# Patient Record
Sex: Female | Born: 1955
Health system: Southern US, Community
[De-identification: ages and names within clinical notes are randomized; demographics above are authoritative.]

## PROBLEM LIST (undated history)

## (undated) DIAGNOSIS — T7840XA Allergy, unspecified, initial encounter: Secondary | ICD-10-CM

## (undated) DIAGNOSIS — M1990 Unspecified osteoarthritis, unspecified site: Secondary | ICD-10-CM

## (undated) DIAGNOSIS — L570 Actinic keratosis: Secondary | ICD-10-CM

## (undated) DIAGNOSIS — Z8719 Personal history of other diseases of the digestive system: Secondary | ICD-10-CM

## (undated) DIAGNOSIS — B029 Zoster without complications: Secondary | ICD-10-CM

## (undated) DIAGNOSIS — T8859XA Other complications of anesthesia, initial encounter: Secondary | ICD-10-CM

## (undated) DIAGNOSIS — K219 Gastro-esophageal reflux disease without esophagitis: Secondary | ICD-10-CM

## (undated) DIAGNOSIS — E785 Hyperlipidemia, unspecified: Secondary | ICD-10-CM

## (undated) DIAGNOSIS — K649 Unspecified hemorrhoids: Secondary | ICD-10-CM

## (undated) HISTORY — DX: Allergy, unspecified, initial encounter: T78.40XA

## (undated) HISTORY — PX: FOOT SURGERY: SHX648

## (undated) HISTORY — PX: FOOT FRACTURE SURGERY: SHX645

## (undated) HISTORY — DX: Actinic keratosis: L57.0

## (undated) HISTORY — DX: Hyperlipidemia, unspecified: E78.5

---

## 2005-05-04 ENCOUNTER — Ambulatory Visit: Payer: Self-pay | Admitting: Family Medicine

## 2006-06-23 ENCOUNTER — Ambulatory Visit: Payer: Self-pay | Admitting: Family Medicine

## 2006-09-28 HISTORY — PX: PLANTAR FASCIECTOMY: SUR600

## 2007-10-10 ENCOUNTER — Ambulatory Visit: Payer: Self-pay | Admitting: Family Medicine

## 2008-01-10 ENCOUNTER — Ambulatory Visit: Payer: Self-pay | Admitting: Gastroenterology

## 2008-07-31 ENCOUNTER — Ambulatory Visit: Payer: Self-pay | Admitting: Family Medicine

## 2008-12-11 ENCOUNTER — Ambulatory Visit: Payer: Self-pay | Admitting: Family Medicine

## 2009-12-24 ENCOUNTER — Ambulatory Visit: Payer: Self-pay | Admitting: Family Medicine

## 2010-07-14 ENCOUNTER — Ambulatory Visit: Payer: Self-pay | Admitting: Family Medicine

## 2010-07-16 ENCOUNTER — Ambulatory Visit: Payer: Self-pay | Admitting: Family Medicine

## 2011-04-16 ENCOUNTER — Ambulatory Visit: Payer: Self-pay | Admitting: Family Medicine

## 2012-07-13 ENCOUNTER — Ambulatory Visit: Payer: Self-pay | Admitting: Family Medicine

## 2013-10-26 ENCOUNTER — Ambulatory Visit: Payer: Self-pay | Admitting: Family Medicine

## 2013-11-03 ENCOUNTER — Ambulatory Visit: Payer: Self-pay | Admitting: Family Medicine

## 2014-09-17 ENCOUNTER — Ambulatory Visit: Payer: Self-pay | Admitting: Unknown Physician Specialty

## 2014-11-08 ENCOUNTER — Ambulatory Visit: Payer: Self-pay | Admitting: Family Medicine

## 2015-03-06 ENCOUNTER — Ambulatory Visit: Payer: Self-pay | Admitting: Family Medicine

## 2015-03-08 ENCOUNTER — Other Ambulatory Visit: Payer: Self-pay

## 2015-03-08 DIAGNOSIS — B029 Zoster without complications: Secondary | ICD-10-CM | POA: Insufficient documentation

## 2015-03-08 DIAGNOSIS — E785 Hyperlipidemia, unspecified: Secondary | ICD-10-CM | POA: Insufficient documentation

## 2015-03-08 DIAGNOSIS — Z8639 Personal history of other endocrine, nutritional and metabolic disease: Secondary | ICD-10-CM | POA: Insufficient documentation

## 2015-03-08 DIAGNOSIS — M5412 Radiculopathy, cervical region: Secondary | ICD-10-CM | POA: Insufficient documentation

## 2015-03-08 DIAGNOSIS — M779 Enthesopathy, unspecified: Secondary | ICD-10-CM | POA: Insufficient documentation

## 2015-03-08 DIAGNOSIS — Z0000 Encounter for general adult medical examination without abnormal findings: Secondary | ICD-10-CM | POA: Insufficient documentation

## 2015-03-11 ENCOUNTER — Encounter: Payer: Self-pay | Admitting: Family Medicine

## 2015-03-11 ENCOUNTER — Ambulatory Visit (INDEPENDENT_AMBULATORY_CARE_PROVIDER_SITE_OTHER): Payer: BC Managed Care – PPO | Admitting: Family Medicine

## 2015-03-11 VITALS — BP 110/60 | HR 64 | Ht 59.0 in | Wt 142.0 lb

## 2015-03-11 DIAGNOSIS — M545 Low back pain, unspecified: Secondary | ICD-10-CM

## 2015-03-11 DIAGNOSIS — S46012D Strain of muscle(s) and tendon(s) of the rotator cuff of left shoulder, subsequent encounter: Secondary | ICD-10-CM

## 2015-03-11 MED ORDER — ETODOLAC 500 MG PO TABS: 500.0000 mg | ORAL_TABLET | Freq: Two times a day (BID) | ORAL | Status: DC

## 2015-03-11 NOTE — Progress Notes (Signed)
Name: Jennifer Kirby   MRN: [id number]    DOB: 1956/04/29   Date:03/11/2015       Progress Note  Subjective  Chief Complaint  Chief Complaint  Patient presents with  . Back Pain    Lower  . Leg Pain    Left  . Otalgia    Left    Back Pain This is a recurrent problem. The current episode started more than 1 month ago. The problem occurs daily. The problem is unchanged. The pain is present in the lumbar spine and sacro-iliac. The quality of the pain is described as aching. The pain radiates to the right thigh and left knee. The pain is at a severity of 5/10. The pain is moderate. The pain is worse during the day. The symptoms are aggravated by bending, sitting and twisting. Associated symptoms include leg pain. Pertinent negatives include no bladder incontinence, bowel incontinence, fever, numbness, paresis, paresthesias or weakness.  Leg Pain  Pertinent negatives include no inability to bear weight or numbness.  Otalgia   Shoulder Pain  This is a recurrent problem. The problem occurs daily. The problem has been unchanged. The pain is moderate. Pertinent negatives include no fever, inability to bear weight, itching, joint locking, joint swelling, limited range of motion or numbness. The symptoms are aggravated by activity. She has tried acetaminophen and NSAIDS for the symptoms. The treatment provided no relief.    No problem-specific assessment & plan notes found for this encounter.   Past Medical History  Diagnosis Date  . Hyperlipidemia     Past Surgical History  Procedure Laterality Date  . Foot surgery Left     plantar fasciatis and broken foot    History reviewed. No pertinent family history.  History   Social History  . Marital Status: Married    Spouse Name: N/A  . Number of Children: N/A  . Years of Education: N/A   Occupational History  . Not on file.   Social History Main Topics  . Smoking status: Never Smoker   . Smokeless tobacco: Not on file   . Alcohol Use: 0.0 oz/week    0 Standard drinks or equivalent per week  . Drug Use: No  . Sexual Activity: No   Other Topics Concern  . Not on file   Social History Narrative  . No narrative on file    No Known Allergies   Review of Systems  Constitutional: Negative for fever.  HENT: Positive for ear pain.   Gastrointestinal: Negative for bowel incontinence.  Genitourinary: Negative for bladder incontinence.  Musculoskeletal: Positive for back pain.  Skin: Negative for itching.  Neurological: Negative for weakness, numbness and paresthesias.     Objective  Filed Vitals:   03/11/15 0904  BP: 110/60  Pulse: 64  Height: 4\' 11"  (1.499 m)  Weight: 142 lb (64.411 kg)    Physical Exam    No results found for this or any previous visit (from the past 2160 hour(s)).   Assessment & Plan  Problem List Items Addressed This Visit    None    Visit Diagnoses    Bilateral low back pain without sciatica    -  Primary    Relevant Medications    etodolac (LODINE) 500 MG tablet    Other Relevant Orders    DG Lumbar Spine Complete    Ambulatory referral to Physical Therapy    Rotator cuff (capsule) sprain and strain, left, subsequent encounter  Relevant Medications    etodolac (LODINE) 500 MG tablet         Dr. Otilio Miu [facility]  03/11/2015

## 2015-05-22 ENCOUNTER — Other Ambulatory Visit: Payer: Self-pay

## 2015-05-22 DIAGNOSIS — E785 Hyperlipidemia, unspecified: Secondary | ICD-10-CM

## 2015-05-22 MED ORDER — SIMVASTATIN 10 MG PO TABS: 10.0000 mg | ORAL_TABLET | Freq: Every day | ORAL | Status: DC

## 2015-08-13 ENCOUNTER — Other Ambulatory Visit: Payer: Self-pay

## 2015-08-14 ENCOUNTER — Encounter: Payer: Self-pay | Admitting: Family Medicine

## 2015-08-14 ENCOUNTER — Ambulatory Visit (INDEPENDENT_AMBULATORY_CARE_PROVIDER_SITE_OTHER): Payer: BC Managed Care – PPO | Admitting: Family Medicine

## 2015-08-14 VITALS — BP 120/80 | HR 70 | Ht 59.0 in | Wt 142.0 lb

## 2015-08-14 DIAGNOSIS — K644 Residual hemorrhoidal skin tags: Secondary | ICD-10-CM

## 2015-08-14 DIAGNOSIS — K5900 Constipation, unspecified: Secondary | ICD-10-CM | POA: Diagnosis not present

## 2015-08-14 DIAGNOSIS — K648 Other hemorrhoids: Secondary | ICD-10-CM

## 2015-08-14 DIAGNOSIS — Z23 Encounter for immunization: Secondary | ICD-10-CM | POA: Diagnosis not present

## 2015-08-14 LAB — HEMOCCULT GUIAC POC 1CARD (OFFICE): Fecal Occult Blood, POC: POSITIVE — AB

## 2015-08-14 MED ORDER — HYDROCORTISONE 2.5 % RE CREA: 1.0000 "application " | TOPICAL_CREAM | Freq: Two times a day (BID) | RECTAL | Status: DC

## 2015-08-14 MED ORDER — SENNOSIDES-DOCUSATE SODIUM 8.6-50 MG PO TABS: 1.0000 | ORAL_TABLET | Freq: Every day | ORAL | Status: DC

## 2015-08-14 NOTE — Progress Notes (Signed)
Name: Jennifer Kirby   MRN: [id number]    DOB: 1955-10-07   Date:08/14/2015       Progress Note  Subjective  Chief Complaint  Chief Complaint  Patient presents with  . Hemorrhoids    been "sticking out for about 2 months"    GI Problem The primary symptoms include hematochezia. Primary symptoms do not include fever, weight loss, fatigue, abdominal pain, nausea, vomiting, diarrhea, melena, hematemesis, jaundice, dysuria, myalgias, arthralgias or rash.  The illness does not include chills, anorexia, dysphagia, odynophagia, bloating, constipation, tenesmus, back pain or itching. Significant associated medical issues include hemorrhoids. Associated medical issues do not include inflammatory bowel disease, irritable bowel syndrome or diverticulitis.    No problem-specific assessment & plan notes found for this encounter.   Past Medical History  Diagnosis Date  . Hyperlipidemia     Past Surgical History  Procedure Laterality Date  . Foot surgery Left     plantar fasciatis and broken foot    History reviewed. No pertinent family history.  Social History   Social History  . Marital Status: Married    Spouse Name: N/A  . Number of Children: N/A  . Years of Education: N/A   Occupational History  . Not on file.   Social History Main Topics  . Smoking status: Never Smoker   . Smokeless tobacco: Not on file  . Alcohol Use: 0.0 oz/week    0 Standard drinks or equivalent per week  . Drug Use: No  . Sexual Activity: No   Other Topics Concern  . Not on file   Social History Narrative    No Known Allergies   Review of Systems  Constitutional: Negative for fever, chills, weight loss, malaise/fatigue and fatigue.  HENT: Negative for ear discharge, ear pain and sore throat.   Eyes: Negative for blurred vision.  Respiratory: Negative for cough, sputum production, shortness of breath and wheezing.   Cardiovascular: Negative for chest pain, palpitations and leg  swelling.  Gastrointestinal: Positive for hematochezia. Negative for heartburn, dysphagia, nausea, vomiting, abdominal pain, diarrhea, constipation, blood in stool, melena, bloating, anorexia, hematemesis and jaundice.  Genitourinary: Negative for dysuria, urgency, frequency and hematuria.  Musculoskeletal: Negative for myalgias, back pain, joint pain, arthralgias and neck pain.  Skin: Negative for itching and rash.  Neurological: Negative for dizziness, tingling, sensory change, focal weakness and headaches.  Endo/Heme/Allergies: Negative for environmental allergies and polydipsia. Does not bruise/bleed easily.  Psychiatric/Behavioral: Negative for depression and suicidal ideas. The patient is not nervous/anxious and does not have insomnia.      Objective  Filed Vitals:   08/14/15 0800  BP: 120/80  Pulse: 70  Height: 4\' 11"  (1.499 m)  Weight: 142 lb (64.411 kg)    Physical Exam  Constitutional: She is well-developed, well-nourished, and in no distress. No distress.  HENT:  Head: Normocephalic and atraumatic.  Right Ear: External ear normal.  Left Ear: External ear normal.  Nose: Nose normal.  Mouth/Throat: Oropharynx is clear and moist.  Eyes: Conjunctivae and EOM are normal. Pupils are equal, round, and reactive to light. Right eye exhibits no discharge. Left eye exhibits no discharge.  Neck: Normal range of motion. Neck supple. No JVD present. No thyromegaly present.  Cardiovascular: Normal rate, regular rhythm, normal heart sounds and intact distal pulses.  Exam reveals no gallop and no friction rub.   No murmur heard. Pulmonary/Chest: Effort normal and breath sounds normal.  Abdominal: Soft. Bowel sounds are normal. She exhibits no mass. There is no  tenderness. There is no guarding.  Genitourinary: Rectal exam shows external hemorrhoid and tenderness. Rectal exam shows no internal hemorrhoid and no fissure. Guaiac positive stool.  Musculoskeletal: Normal range of motion. She  exhibits no edema.  Lymphadenopathy:    She has no cervical adenopathy.  Neurological: She is alert.  Skin: Skin is warm and dry. She is not diaphoretic.  Psychiatric: Mood and affect normal.  Nursing note and vitals reviewed.     Assessment & Plan  Problem List Items Addressed This Visit    None    Visit Diagnoses    External hemorrhoids    -  Primary    Relevant Medications    hydrocortisone (ANUSOL-HC) 2.5 % rectal cream    senna-docusate (SENOKOT-S) 8.6-50 MG tablet    Other Relevant Orders    POCT Occult Blood Stool (Completed)    Constipation, unspecified constipation type        Relevant Medications    senna-docusate (SENOKOT-S) 8.6-50 MG tablet    Need for influenza vaccination        Relevant Orders    Flu Vaccine QUAD 36+ mos PF IM (Fluarix & Fluzone Quad PF) (Completed)         Dr. Deanna Jones [facility]  08/14/2015

## 2015-08-14 NOTE — Patient Instructions (Signed)
Disposable Sitz Bath A disposable sitz bath is a plastic basin that fits over the toilet. A bag is hung above the toilet and is connected to a tube that opens into the disposable sitz bath. The bag is filled with warm water that can flow into the basin through the tube.  HOW TO USE A DISPOSABLE SITZ BATH Close the clamp on the tubing before filling the bag with water. This is to prevent leakage. Fill the sitz bath basin and the plastic bag with warm water. Place the filled basin on the toilet with the seat raised. Make sure the overflow opening is facing toward the back of the toilet. Hang the filled plastic bag overhead on a hook or towel rack close to the toilet. When the bag is unclamped, a steady stream of water will flow from the bag, through the tubing, and into the basin. Attach the tubing to the opening on the basin. Sit on the basin positioned on the toilet seat and release the clamp. This will allow warm water to flush the area around your genitals and anus (perineum). Remain sitting on the basin for approximately 15 to 20 minutes. Stand up and pat the perineum area dry. If needed, apply clean bandages (dressings) to the affected area. Tip the basin into the toilet to remove any remaining water and flush the toilet. Wash the basin with warm water and soap. Let it dry in the sink. Store the basin and tubing in a clean, dry area. Wash your hands with soap and water. SEEK MEDICAL CARE IF: You get worse instead of better. Stop the sitz baths if you get worse. MAKE SURE YOU: Understand these instructions. Will watch your condition. Will get help right away if you are not doing well or get worse.   This information is not intended to replace advice given to you by your health care provider. Make sure you discuss any questions you have with your health care provider.   Document Released: 03/15/2012 Document Revised: 06/08/2012 Document Reviewed: 03/15/2012 Elsevier Interactive Patient  Education 2016 [employer] Inc. High-Fiber Diet Fiber, also called dietary fiber, is a type of carbohydrate found in fruits, vegetables, whole grains, and beans. A high-fiber diet can have many health benefits. Your health care provider may recommend a high-fiber diet to help:  Prevent constipation. Fiber can make your bowel movements more regular.  Lower your cholesterol.  Relieve hemorrhoids, uncomplicated diverticulosis, or irritable bowel syndrome.  Prevent overeating as part of a weight-loss plan.  Prevent heart disease, type 2 diabetes, and certain cancers. WHAT IS MY PLAN? The recommended daily intake of fiber includes:  38 grams for men under age 46.  57 grams for men over age 18.  53 grams for women under age 72.  46 grams for women over age 84. You can get the recommended daily intake of dietary fiber by eating a variety of fruits, vegetables, grains, and beans. Your health care provider may also recommend a fiber supplement if it is not possible to get enough fiber through your diet. WHAT DO I NEED TO KNOW ABOUT A HIGH-FIBER DIET?  Fiber supplements have not been widely studied for their effectiveness, so it is better to get fiber through food sources.  Always check the fiber content on thenutrition facts label of any prepackaged food. Look for foods that contain at least 5 grams of fiber per serving.  Ask your dietitian if you have questions about specific foods that are related to your condition, especially if those  foods are not listed in the following section.  Increase your daily fiber consumption gradually. Increasing your intake of dietary fiber too quickly may cause bloating, cramping, or gas.  Drink plenty of water. Water helps you to digest fiber. WHAT FOODS CAN I EAT? Grains Whole-grain breads. Multigrain cereal. Oats and oatmeal. Brown rice. Barley. Bulgur wheat. [place]. Bran muffins. Popcorn. Rye wafer crackers. Vegetables Sweet potatoes. Spinach. Kale.  Artichokes. Cabbage. Broccoli. Green peas. Carrots. Squash. Fruits Berries. Pears. Apples. Oranges. Avocados. Prunes and raisins. Dried figs. Meats and Other Protein Sources Navy, kidney, pinto, and soy beans. Split peas. Lentils. Nuts and seeds. Dairy Fiber-fortified yogurt. Beverages Fiber-fortified soy milk. Fiber-fortified orange juice. Other Fiber bars. The items listed above may not be a complete list of recommended foods or beverages. Contact your dietitian for more options. WHAT FOODS ARE NOT RECOMMENDED? Grains White bread. Pasta made with refined flour. White rice. Vegetables Fried potatoes. Canned vegetables. Well-cooked vegetables.  Fruits Fruit juice. Cooked, strained fruit. Meats and Other Protein Sources Fatty cuts of meat. Fried Sales executive or fried fish. Dairy Milk. Yogurt. Cream cheese. Sour cream. Beverages Soft drinks. Other Cakes and pastries. Butter and oils. The items listed above may not be a complete list of foods and beverages to avoid. Contact your dietitian for more information. WHAT ARE SOME TIPS FOR INCLUDING HIGH-FIBER FOODS IN MY DIET?  Eat a wide variety of high-fiber foods.  Make sure that half of all grains consumed each day are whole grains.  Replace breads and cereals made from refined flour or white flour with whole-grain breads and cereals.  Replace white rice with brown rice, bulgur wheat, or millet.  Start the day with a breakfast that is high in fiber, such as a cereal that contains at least 5 grams of fiber per serving.  Use beans in place of meat in soups, salads, or pasta.  Eat high-fiber snacks, such as berries, raw vegetables, nuts, or popcorn.   This information is not intended to replace advice given to you by your health care provider. Make sure you discuss any questions you have with your health care provider.   Document Released: 09/14/2005 Document Revised: 10/05/2014 Document Reviewed: 02/27/2014 Elsevier Interactive  Patient Education 2016 [employer]. Hemorrhoids Hemorrhoids are swollen veins around the rectum or anus. There are two types of hemorrhoids:   Internal hemorrhoids. These occur in the veins just inside the rectum. They may poke through to the outside and become irritated and painful.  External hemorrhoids. These occur in the veins outside the anus and can be felt as a painful swelling or hard lump near the anus. CAUSES  Pregnancy.   Obesity.   Constipation or diarrhea.   Straining to have a bowel movement.   Sitting for long periods on the toilet.  Heavy lifting or other activity that caused you to strain.  Anal intercourse. SYMPTOMS   Pain.   Anal itching or irritation.   Rectal bleeding.   Fecal leakage.   Anal swelling.   One or more lumps around the anus.  DIAGNOSIS  Your caregiver may be able to diagnose hemorrhoids by visual examination. Other examinations or tests that may be performed include:   Examination of the rectal area with a gloved hand (digital rectal exam).   Examination of anal canal using a small tube (scope).   A blood test if you have lost a significant amount of blood.  A test to look inside the colon (sigmoidoscopy or colonoscopy). TREATMENT Most hemorrhoids can be  treated at home. However, if symptoms do not seem to be getting better or if you have a lot of rectal bleeding, your caregiver may perform a procedure to help make the hemorrhoids get smaller or remove them completely. Possible treatments include:   Placing a rubber band at the base of the hemorrhoid to cut off the circulation (rubber band ligation).   Injecting a chemical to shrink the hemorrhoid (sclerotherapy).   Using a tool to burn the hemorrhoid (infrared light therapy).   Surgically removing the hemorrhoid (hemorrhoidectomy).   Stapling the hemorrhoid to block blood flow to the tissue (hemorrhoid stapling).  HOME CARE INSTRUCTIONS   Eat foods with  fiber, such as whole grains, beans, nuts, fruits, and vegetables. Ask your doctor about taking products with added fiber in them (fibersupplements).  Increase fluid intake. Drink enough water and fluids to keep your urine clear or pale yellow.   Exercise regularly.   Go to the bathroom when you have the urge to have a bowel movement. Do not wait.   Avoid straining to have bowel movements.   Keep the anal area dry and clean. Use wet toilet paper or moist towelettes after a bowel movement.   Medicated creams and suppositories may be used or applied as directed.   Only take over-the-counter or prescription medicines as directed by your caregiver.   Take warm sitz baths for 15-20 minutes, 3-4 times a day to ease pain and discomfort.   Place ice packs on the hemorrhoids if they are tender and swollen. Using ice packs between sitz baths may be helpful.   Put ice in a plastic bag.   Place a towel between your skin and the bag.   Leave the ice on for 15-20 minutes, 3-4 times a day.   Do not use a donut-shaped pillow or sit on the toilet for long periods. This increases blood pooling and pain.  SEEK MEDICAL CARE IF:  You have increasing pain and swelling that is not controlled by treatment or medicine.  You have uncontrolled bleeding.  You have difficulty or you are unable to have a bowel movement.  You have pain or inflammation outside the area of the hemorrhoids. MAKE SURE YOU:  Understand these instructions.  Will watch your condition.  Will get help right away if you are not doing well or get worse.   This information is not intended to replace advice given to you by your health care provider. Make sure you discuss any questions you have with your health care provider.   Document Released: 09/11/2000 Document Revised: 08/31/2012 Document Reviewed: 07/19/2012 Elsevier Interactive Patient Education [employer].

## 2015-08-30 ENCOUNTER — Other Ambulatory Visit: Payer: Self-pay | Admitting: Family Medicine

## 2015-09-12 ENCOUNTER — Other Ambulatory Visit: Payer: Self-pay | Admitting: Family Medicine

## 2015-09-13 ENCOUNTER — Other Ambulatory Visit: Payer: Self-pay

## 2015-09-13 DIAGNOSIS — K644 Residual hemorrhoidal skin tags: Secondary | ICD-10-CM

## 2015-09-13 MED ORDER — HYDROCORTISONE 2.5 % RE CREA: 1.0000 "application " | TOPICAL_CREAM | Freq: Two times a day (BID) | RECTAL | Status: DC

## 2015-11-30 ENCOUNTER — Other Ambulatory Visit: Payer: Self-pay | Admitting: Family Medicine

## 2015-12-30 ENCOUNTER — Ambulatory Visit (INDEPENDENT_AMBULATORY_CARE_PROVIDER_SITE_OTHER): Payer: BC Managed Care – PPO | Admitting: Family Medicine

## 2015-12-30 ENCOUNTER — Encounter: Payer: Self-pay | Admitting: Family Medicine

## 2015-12-30 VITALS — BP 100/60 | HR 86 | Temp 99.4°F | Ht 59.0 in | Wt 147.0 lb

## 2015-12-30 DIAGNOSIS — J0100 Acute maxillary sinusitis, unspecified: Secondary | ICD-10-CM

## 2015-12-30 DIAGNOSIS — J101 Influenza due to other identified influenza virus with other respiratory manifestations: Secondary | ICD-10-CM

## 2015-12-30 LAB — POCT INFLUENZA A/B
INFLUENZA A, POC: NEGATIVE
INFLUENZA B, POC: POSITIVE — AB

## 2015-12-30 MED ORDER — GUAIFENESIN-CODEINE 100-10 MG/5ML PO SYRP: 5.0000 mL | ORAL_SOLUTION | Freq: Three times a day (TID) | ORAL | Status: DC | PRN

## 2015-12-30 MED ORDER — OSELTAMIVIR PHOSPHATE 75 MG PO CAPS: 75.0000 mg | ORAL_CAPSULE | Freq: Two times a day (BID) | ORAL | Status: DC

## 2015-12-30 MED ORDER — AMOXICILLIN 500 MG PO CAPS
500.0000 mg | ORAL_CAPSULE | Freq: Three times a day (TID) | ORAL | Status: DC
Start: 2015-12-30 — End: 2016-07-15

## 2015-12-30 NOTE — Progress Notes (Signed)
Name: Jennifer Kirby   MRN: [id number]    DOB: 1956/08/21   Date:12/30/2015       Progress Note  Subjective  Chief Complaint  Chief Complaint  Patient presents with  . Sinusitis    fever and chills, cough- thick, yellow production    Sinusitis This is a new problem. The current episode started in the past 7 days. The problem has been gradually worsening since onset. The maximum temperature recorded prior to her arrival was 101 - 101.9 F. The fever has been present for less than 1 day. Her pain is at a severity of 3/10. The pain is mild. Associated symptoms include chills, congestion, coughing, diaphoresis, ear pain, headaches, a hoarse voice, sinus pressure, sneezing, a sore throat and swollen glands. Pertinent negatives include no neck pain or shortness of breath. Past treatments include acetaminophen and oral decongestants. The treatment provided mild relief.    No problem-specific assessment & plan notes found for this encounter.   Past Medical History  Diagnosis Date  . Hyperlipidemia     Past Surgical History  Procedure Laterality Date  . Foot surgery Left     plantar fasciatis and broken foot    History reviewed. No pertinent family history.  Social History   Social History  . Marital Status: Married    Spouse Name: N/A  . Number of Children: N/A  . Years of Education: N/A   Occupational History  . Not on file.   Social History Main Topics  . Smoking status: Never Smoker   . Smokeless tobacco: Not on file  . Alcohol Use: 0.0 oz/week    0 Standard drinks or equivalent per week  . Drug Use: No  . Sexual Activity: No   Other Topics Concern  . Not on file   Social History Narrative    No Known Allergies   Review of Systems  Constitutional: Positive for chills and diaphoresis. Negative for fever, weight loss and malaise/fatigue.  HENT: Positive for congestion, ear pain, hoarse voice, sinus pressure, sneezing and sore throat. Negative for ear  discharge.   Eyes: Negative for blurred vision.  Respiratory: Positive for cough. Negative for sputum production, shortness of breath and wheezing.   Cardiovascular: Negative for chest pain, palpitations and leg swelling.  Gastrointestinal: Negative for heartburn, nausea, abdominal pain, diarrhea, constipation, blood in stool and melena.  Genitourinary: Negative for dysuria, urgency, frequency and hematuria.  Musculoskeletal: Negative for myalgias, back pain, joint pain and neck pain.  Skin: Negative for rash.  Neurological: Positive for headaches. Negative for dizziness, tingling, sensory change and focal weakness.  Endo/Heme/Allergies: Negative for environmental allergies and polydipsia. Does not bruise/bleed easily.  Psychiatric/Behavioral: Negative for depression and suicidal ideas. The patient is not nervous/anxious and does not have insomnia.      Objective  Filed Vitals:   12/30/15 1014  BP: 100/60  Pulse: 86  Temp: 99.4 F (37.4 C)  TempSrc: Oral  Height: 4\' 11"  (1.499 m)  Weight: 147 lb (66.679 kg)  SpO2: 98%    Physical Exam  Constitutional: She is well-developed, well-nourished, and in no distress. No distress.  HENT:  Head: Normocephalic and atraumatic.  Right Ear: External ear normal.  Left Ear: External ear normal.  Nose: Nose normal.  Mouth/Throat: Oropharynx is clear and moist.  Eyes: Conjunctivae and EOM are normal. Pupils are equal, round, and reactive to light. Right eye exhibits no discharge. Left eye exhibits no discharge.  Neck: Normal range of motion. Neck supple. No JVD present.  No thyromegaly present.  Cardiovascular: Normal rate, regular rhythm, normal heart sounds and intact distal pulses.  Exam reveals no gallop and no friction rub.   No murmur heard. Pulmonary/Chest: Effort normal and breath sounds normal. No respiratory distress. She has no wheezes. She has no rales.  Abdominal: Soft. Bowel sounds are normal. She exhibits no mass. There is no  tenderness. There is no guarding.  Musculoskeletal: Normal range of motion. She exhibits no edema.  Lymphadenopathy:    She has no cervical adenopathy.  Neurological: She is alert. She has normal reflexes.  Skin: Skin is warm and dry. No rash noted. She is not diaphoretic. No erythema.  Psychiatric: Mood and affect normal.  Nursing note and vitals reviewed.     Assessment & Plan  Problem List Items Addressed This Visit    None    Visit Diagnoses    Influenza B    -  Primary    Relevant Medications    oseltamivir (TAMIFLU) 75 MG capsule    Other Relevant Orders    POCT Influenza A/B (Completed)    Acute maxillary sinusitis, recurrence not specified        Relevant Medications    oseltamivir (TAMIFLU) 75 MG capsule    amoxicillin (AMOXIL) 500 MG capsule    guaiFENesin-codeine (ROBITUSSIN AC) 100-10 MG/5ML syrup         Dr. Mariany Mackintosh [facility]  12/30/2015

## 2016-01-13 ENCOUNTER — Other Ambulatory Visit: Payer: Self-pay

## 2016-01-13 DIAGNOSIS — J309 Allergic rhinitis, unspecified: Secondary | ICD-10-CM

## 2016-01-13 MED ORDER — MONTELUKAST SODIUM 10 MG PO TABS: 10.0000 mg | ORAL_TABLET | Freq: Every day | ORAL | Status: DC

## 2016-02-28 ENCOUNTER — Other Ambulatory Visit: Payer: Self-pay | Admitting: Family Medicine

## 2016-05-10 ENCOUNTER — Other Ambulatory Visit: Payer: Self-pay | Admitting: Family Medicine

## 2016-05-10 DIAGNOSIS — J309 Allergic rhinitis, unspecified: Secondary | ICD-10-CM

## 2016-05-28 ENCOUNTER — Other Ambulatory Visit: Payer: Self-pay | Admitting: Family Medicine

## 2016-05-29 ENCOUNTER — Other Ambulatory Visit: Payer: Self-pay | Admitting: Family Medicine

## 2016-07-07 ENCOUNTER — Other Ambulatory Visit: Payer: Self-pay | Admitting: Family Medicine

## 2016-07-07 DIAGNOSIS — J309 Allergic rhinitis, unspecified: Secondary | ICD-10-CM

## 2016-07-08 ENCOUNTER — Encounter: Payer: Self-pay | Admitting: *Deleted

## 2016-07-09 ENCOUNTER — Other Ambulatory Visit: Payer: Self-pay

## 2016-07-09 MED ORDER — SIMVASTATIN 10 MG PO TABS: 10.0000 mg | ORAL_TABLET | Freq: Every day | ORAL | 0 refills | Status: DC

## 2016-07-15 ENCOUNTER — Encounter: Payer: Self-pay | Admitting: Family Medicine

## 2016-07-15 ENCOUNTER — Ambulatory Visit (INDEPENDENT_AMBULATORY_CARE_PROVIDER_SITE_OTHER): Payer: BC Managed Care – PPO | Admitting: Family Medicine

## 2016-07-15 VITALS — BP 118/64 | HR 72 | Ht 59.0 in | Wt 145.0 lb

## 2016-07-15 DIAGNOSIS — M170 Bilateral primary osteoarthritis of knee: Secondary | ICD-10-CM

## 2016-07-15 DIAGNOSIS — Z0000 Encounter for general adult medical examination without abnormal findings: Secondary | ICD-10-CM | POA: Diagnosis not present

## 2016-07-15 DIAGNOSIS — L258 Unspecified contact dermatitis due to other agents: Secondary | ICD-10-CM

## 2016-07-15 DIAGNOSIS — K644 Residual hemorrhoidal skin tags: Secondary | ICD-10-CM | POA: Diagnosis not present

## 2016-07-15 DIAGNOSIS — E785 Hyperlipidemia, unspecified: Secondary | ICD-10-CM | POA: Diagnosis not present

## 2016-07-15 DIAGNOSIS — K5900 Constipation, unspecified: Secondary | ICD-10-CM | POA: Diagnosis not present

## 2016-07-15 DIAGNOSIS — Z1231 Encounter for screening mammogram for malignant neoplasm of breast: Secondary | ICD-10-CM | POA: Diagnosis not present

## 2016-07-15 DIAGNOSIS — K219 Gastro-esophageal reflux disease without esophagitis: Secondary | ICD-10-CM | POA: Diagnosis not present

## 2016-07-15 DIAGNOSIS — E663 Overweight: Secondary | ICD-10-CM | POA: Diagnosis not present

## 2016-07-15 DIAGNOSIS — Z23 Encounter for immunization: Secondary | ICD-10-CM

## 2016-07-15 DIAGNOSIS — Z1239 Encounter for other screening for malignant neoplasm of breast: Secondary | ICD-10-CM

## 2016-07-15 LAB — POCT URINALYSIS DIPSTICK
Bilirubin, UA: NEGATIVE
Blood, UA: NEGATIVE
Glucose, UA: NEGATIVE
Ketones, UA: NEGATIVE
LEUKOCYTES UA: NEGATIVE
Nitrite, UA: NEGATIVE
PROTEIN UA: NEGATIVE
Spec Grav, UA: 1.01
UROBILINOGEN UA: 0.2
pH, UA: 6

## 2016-07-15 MED ORDER — SENNOSIDES-DOCUSATE SODIUM 8.6-50 MG PO TABS: 1.0000 | ORAL_TABLET | Freq: Every day | ORAL | 1 refills | Status: DC

## 2016-07-15 MED ORDER — SIMVASTATIN 10 MG PO TABS: ORAL_TABLET | ORAL | 1 refills | Status: DC

## 2016-07-15 MED ORDER — HYDROCORTISONE 2.5 % RE CREA: 1.0000 "application " | TOPICAL_CREAM | Freq: Two times a day (BID) | RECTAL | 1 refills | Status: DC

## 2016-07-15 MED ORDER — CEPHALEXIN 500 MG PO CAPS: 500.0000 mg | ORAL_CAPSULE | Freq: Four times a day (QID) | ORAL | 0 refills | Status: DC

## 2016-07-15 MED ORDER — OMEPRAZOLE 20 MG PO CPDR: 20.0000 mg | DELAYED_RELEASE_CAPSULE | Freq: Every day | ORAL | 1 refills | Status: DC

## 2016-07-15 MED ORDER — CLOTRIMAZOLE-BETAMETHASONE 1-0.05 % EX CREA: 1.0000 "application " | TOPICAL_CREAM | Freq: Two times a day (BID) | CUTANEOUS | 0 refills | Status: DC

## 2016-07-15 MED ORDER — MONTELUKAST SODIUM 10 MG PO TABS: 10.0000 mg | ORAL_TABLET | Freq: Every day | ORAL | 1 refills | Status: DC

## 2016-07-15 NOTE — Patient Instructions (Addendum)
Chondroitin; Glucosamine tablets or capsules What is this medicine? CHONDROITIN; GLUCOSAMINE (chon DROI tin; gloo KOH suh meen) is a dietary supplement. It is promoted for its ability to reduce the symptoms of osteoarthritis by maintaining healthy joint cartilage. The FDA has not approved this supplement for any medical use. This medicine may be used for other purposes; ask your health care provider or pharmacist if you have questions. What should I tell my health care provider before I take this medicine? They need to know if you have any of these conditions: -diabetes -heart disease -kidney disease -liver disease -stomach or intestinal problems -an unusual or allergic reaction to chondroitin, glucosamine, sulfonamides, other medicines, foods, dyes, or preservatives -pregnant or trying to get pregnant -breast-feeding How should I use this medicine? Take this supplement by mouth with a glass of water. Follow the directions on the package labeling, or take as directed by your health care professional. Take your medicine at regular intervals. Do not take this supplement more often than directed. Talk to your pediatrician regarding the use of this medicine in children. Special care may be needed. Overdosage: If you think you have taken too much of this medicine contact a poison control center or emergency room at once. NOTE: This medicine is only for you. Do not share this medicine with others. What if I miss a dose? If you miss a dose, take it as soon as you can. If it is almost time for your next dose, take only that dose. Do not take double or extra doses. What may interact with this medicine? Check with your doctor or healthcare professional if you are taking any of the following medications: -warfarin This list may not describe all possible interactions. Give your health care provider a list of all the medicines, herbs, non-prescription drugs, or dietary supplements you use. Also tell them  if you smoke, drink alcohol, or use illegal drugs. Some items may interact with your medicine. What should I watch for while using this medicine? Tell your doctor or healthcare professional if your symptoms do not start to get better or if they get worse. This supplement may take several weeks to work for you. If you are scheduled for any medical or dental procedure, tell your healthcare provider that you are taking this supplement. You may need to stop taking this supplement before the procedure. This supplement may affect blood sugar levels. If you have diabetes, check with your doctor or health care professional before you change your diet or the dose of your diabetic medicine. Herbal or dietary supplements are not regulated like medicines. Rigid quality control standards are not required for dietary supplements. The purity and strength of these products can vary. The safety and effect of this dietary supplement for a certain disease or illness is not well known. This product is not intended to diagnose, treat, cure or prevent any disease. The Food and Drug Administration suggests the following to help consumers protect themselves: -Always read product labels and follow directions. -Natural does not mean a product is safe for humans to take. -Look for products that include USP after the ingredient name. This means that the manufacturer followed the standards of the [place] Pharmacopoeia. -Supplements made or sold by a nationally known food or drug company are more likely to be made under tight controls. You can write to the company for more information about how the product was made. What side effects may I notice from receiving this medicine? Side effects that you should report  to your doctor or health care professional as soon as possible: -allergic reactions like skin rash, itching or hives, swelling of the face, lips, or tongue -breathing problems -constipation -diarrhea -difficulty  sleeping -drowsiness -hair loss -headache -loss of appetite -stomach pain -swelling of the ankles or feet Side effects that usually do not require medical attention (report to your doctor or health care professional if they continue or are bothersome): -gas -nausea -upset stomach This list may not describe all possible side effects. Call your doctor for medical advice about side effects. You may report side effects to FDA at 1-800-FDA-1088. Where should I keep my medicine? Keep out of the reach of children. Store at room temperature between 15 and 30 degrees C (59 and 86 degrees F) or as directed on the package label. Protect from moisture. Throw away any unused supplement after the expiration date. NOTE: This sheet is a summary. It may not cover all possible information. If you have questions about this medicine, talk to your doctor, pharmacist, or health care provider.    2016, Elsevier/Gold Standard. (2014-10-08 06:58:19) Ketoconazole shampoo What is this medicine? KETOCONAZOLE (kee toe KON na zole) is an antifungal medicine. It is used to treat certain kinds of fungal or yeast infections. This medicine may be used for other purposes; ask your health care provider or pharmacist if you have questions. What should I tell my health care provider before I take this medicine? They need to know if you have any of these conditions: -an unusual or allergic reaction to ketoconazole, itraconazole, miconazole, sulfites, other foods, dyes or preservatives -pregnant or trying to get pregnant -breast-feeding How should I use this medicine? This medicine is for external use only. Follow the directions on the prescription label. Wash your hands before and after use. Apply the shampoo to damp skin of the affected area of the skin or scalp. Use enough shampoo to cover the affected area and a wide margin surrounding the affected area. Work the shampoo into a Public relations account executive and leave on for 5 minutes. Rinse the  treated area completely with water. Do not get the shampoo in your eyes. If you do, rinse out with plenty of cool tap water. Finish the full course prescribed by your doctor or health care professional even if you think your condition is better. Do not stop using except on the advice of your doctor or health care professional. Talk to your pediatrician regarding the use of this medicine in children. Special care may be needed. Overdosage: If you think you have taken too much of this medicine contact a poison control center or emergency room at once. NOTE: This medicine is only for you. Do not share this medicine with others. What if I miss a dose? If you miss a dose, use it as soon as you can. If it is almost time for your next dose, use only that dose. Do not use double or extra doses. What may interact with this medicine? Interactions are not expected. Do not use any other skin products without telling your doctor or health care professional. This list may not describe all possible interactions. Give your health care provider a list of all the medicines, herbs, non-prescription drugs, or dietary supplements you use. Also tell them if you smoke, drink alcohol, or use illegal drugs. Some items may interact with your medicine. What should I watch for while using this medicine? Tell your doctor or health care professional if your symptoms do not begin to improve in 1 to  2 weeks. If your hair has been permanently waved the shampoo may remove the curl. What side effects may I notice from receiving this medicine? Side effects that you should report to your doctor or health care professional as soon as possible: -allergic reactions like skin rash, itching or hives, swelling of the face, lips, or tongue -pain, tingling, numbness Side effects that usually do not require medical attention (report to your doctor or health care professional if they continue or are bothersome): -dry skin -hair loss, hair  discoloration or abnormal texture -skin irritation This list may not describe all possible side effects. Call your doctor for medical advice about side effects. You may report side effects to FDA at 1-800-FDA-1088. Where should I keep my medicine? Keep out of the reach of children. Store at room temperature below 25 degrees C (77 degrees F). Protect from light. Keep container tightly closed. Throw away any unused medicine after the expiration date. NOTE: This sheet is a summary. It may not cover all possible information. If you have questions about this medicine, talk to your doctor, pharmacist, or health care provider.    2016, Elsevier/Gold Standard. (2009-01-11 14:27:49)

## 2016-07-15 NOTE — Progress Notes (Signed)
Name: Jennifer Kirby   MRN: [id number]    DOB: 23-Mar-1956   Date:07/15/2016       Progress Note  Subjective  Chief Complaint  Chief Complaint  Patient presents with  . Annual Exam    no pap    Patient presents for annual physical exam.    No problem-specific Assessment & Plan notes found for this encounter.   Past Medical History:  Diagnosis Date  . Hyperlipidemia     Past Surgical History:  Procedure Laterality Date  . FOOT SURGERY Left    plantar fasciatis and broken foot    History reviewed. No pertinent family history.  Social History   Social History  . Marital status: Married    Spouse name: N/A  . Number of children: N/A  . Years of education: N/A   Occupational History  . Not on file.   Social History Main Topics  . Smoking status: Never Smoker  . Smokeless tobacco: Not on file  . Alcohol use 0.0 oz/week  . Drug use: No  . Sexual activity: No   Other Topics Concern  . Not on file   Social History Narrative  . No narrative on file    No Known Allergies   Review of Systems  Constitutional: Negative for chills, fever, malaise/fatigue and weight loss.  HENT: Negative for congestion, ear discharge, ear pain, hearing loss, nosebleeds, sore throat and tinnitus.   Eyes: Negative for blurred vision, double vision, photophobia, pain, discharge and redness.  Respiratory: Negative for cough, hemoptysis, sputum production, shortness of breath, wheezing and stridor.   Cardiovascular: Negative for chest pain, palpitations, orthopnea, claudication, leg swelling and PND.  Gastrointestinal: Positive for heartburn. Negative for abdominal pain, blood in stool, constipation, diarrhea, melena, nausea and vomiting.  Genitourinary: Negative for dysuria, flank pain, frequency, hematuria and urgency.  Musculoskeletal: Negative for back pain, falls, joint pain, myalgias and neck pain.  Skin: Negative for itching and rash.  Neurological: Negative for  dizziness, tingling, tremors, sensory change, speech change, focal weakness, seizures, loss of consciousness and headaches.  Endo/Heme/Allergies: Negative for environmental allergies and polydipsia. Does not bruise/bleed easily.  Psychiatric/Behavioral: Negative for depression, hallucinations, memory loss, substance abuse and suicidal ideas. The patient is not nervous/anxious and does not have insomnia.      Objective  Vitals:   07/15/16 0938  BP: 118/64  Pulse: 72  Weight: 145 lb (65.8 kg)  Height: 4\' 11"  (1.499 m)    Physical Exam  Constitutional: She is well-developed, well-nourished, and in no distress. No distress.  HENT:  Head: Normocephalic and atraumatic.  Right Ear: External ear normal.  Left Ear: External ear normal.  Nose: Nose normal.  Mouth/Throat: Uvula is midline, oropharynx is clear and moist and mucous membranes are normal.  Eyes: Conjunctivae, EOM and lids are normal. Pupils are equal, round, and reactive to light. Right eye exhibits no discharge. Left eye exhibits no discharge.  Fundoscopic exam:      The right eye shows no arteriolar narrowing, no AV nicking and no papilledema.       The left eye shows no arteriolar narrowing, no AV nicking and no papilledema.  Neck: Trachea normal, normal range of motion, full passive range of motion without pain and phonation normal. Neck supple. Normal carotid pulses, no hepatojugular reflux and no JVD present. Carotid bruit is not present. No tracheal deviation, no edema and no erythema present. No thyroid mass and no thyromegaly present.  Cardiovascular: Normal rate, regular rhythm, S1 normal, S2  normal, normal heart sounds, intact distal pulses and normal pulses.  Exam reveals no gallop, no S3, no S4 and no friction rub.   No murmur heard. Pulmonary/Chest: Effort normal and breath sounds normal. No accessory muscle usage. No respiratory distress. Right breast exhibits no inverted nipple, no mass, no nipple discharge, no skin  change and no tenderness. Left breast exhibits no inverted nipple, no mass, no nipple discharge, no skin change and no tenderness. Breasts are symmetrical.  Abdominal: Soft. Normal aorta and bowel sounds are normal. There is no hepatosplenomegaly. There is no tenderness. There is no CVA tenderness.  Musculoskeletal: Normal range of motion. She exhibits no edema.       Right knee: Tenderness found. Medial joint line and lateral joint line tenderness noted.       Left knee: Tenderness found. Medial joint line and lateral joint line tenderness noted.  Lymphadenopathy:       Head (right side): No submandibular adenopathy present.       Head (left side): No submandibular adenopathy present.    She has no cervical adenopathy.    She has no axillary adenopathy.  Neurological: She is alert. She has normal strength, normal reflexes and intact cranial nerves.  Skin: Skin is warm and dry. No rash noted. She is not diaphoretic. There is erythema. No pallor.     Psychiatric: Mood, memory and affect normal.  Nursing note and vitals reviewed.     Assessment & Plan  Problem List Items Addressed This Visit      Other   HLD (hyperlipidemia)   Relevant Medications   simvastatin (ZOCOR) 10 MG tablet    Other Visit Diagnoses    Annual physical exam    -  Primary   Relevant Orders   Lipid Profile   Renal Function Panel   POCT Urinalysis Dipstick (Completed)   Contact dermatitis due to other agent, unspecified contact dermatitis type       area elevated with sterile hemostat   Relevant Medications   clotrimazole-betamethasone (LOTRISONE) cream   montelukast (SINGULAIR) 10 MG tablet   Primary osteoarthritis of both knees       External hemorrhoid       Relevant Medications   simvastatin (ZOCOR) 10 MG tablet   Overweight       Relevant Orders   Lipid Profile   Renal Function Panel   Gastroesophageal reflux disease, esophagitis presence not specified       Relevant Medications   omeprazole  (PRILOSEC) 20 MG capsule   senna-docusate (SENOKOT-S) 8.6-50 MG tablet   External hemorrhoids       Relevant Medications   senna-docusate (SENOKOT-S) 8.6-50 MG tablet   simvastatin (ZOCOR) 10 MG tablet   hydrocortisone (ANUSOL-HC) 2.5 % rectal cream   Constipation, unspecified constipation type       Relevant Medications   senna-docusate (SENOKOT-S) 8.6-50 MG tablet   Breast cancer screening       Relevant Orders   MM Digital Screening   Flu vaccine need       Relevant Orders   Flu Vaccine QUAD 36+ mos PF IM (Fluarix & Fluzone Quad PF) (Completed)        Dr. V[facility]  07/15/16

## 2016-07-16 LAB — RENAL FUNCTION PANEL
ALBUMIN: 4.6 g/dL (ref 3.6–4.8)
BUN/Creatinine Ratio: 21 (ref 12–28)
BUN: 11 mg/dL (ref 8–27)
CO2: 26 mmol/L (ref 18–29)
CREATININE: 0.52 mg/dL — AB (ref 0.57–1.00)
Calcium: 9.5 mg/dL (ref 8.7–10.3)
Chloride: 99 mmol/L (ref 96–106)
GFR, EST AFRICAN AMERICAN: 118 mL/min/{1.73_m2} (ref >59)
GFR, EST NON AFRICAN AMERICAN: 103 mL/min/{1.73_m2} (ref >59)
Glucose: 93 mg/dL (ref 65–99)
PHOSPHORUS: 4.5 mg/dL (ref 2.5–4.5)
Potassium: 4.6 mmol/L (ref 3.5–5.2)
Sodium: 140 mmol/L (ref 134–144)

## 2016-07-16 LAB — LIPID PANEL
Chol/HDL Ratio: 3 ratio units (ref 0.0–4.4)
Cholesterol, Total: 223 mg/dL — ABNORMAL HIGH (ref 100–199)
HDL: 74 mg/dL (ref >39)
LDL Calculated: 131 mg/dL — ABNORMAL HIGH (ref 0–99)
TRIGLYCERIDES: 88 mg/dL (ref 0–149)
VLDL CHOLESTEROL CAL: 18 mg/dL (ref 5–40)

## 2016-07-17 ENCOUNTER — Other Ambulatory Visit: Payer: Self-pay

## 2016-07-20 ENCOUNTER — Encounter: Payer: Self-pay | Admitting: *Deleted

## 2016-07-27 ENCOUNTER — Encounter: Payer: Self-pay | Admitting: General Surgery

## 2016-07-27 ENCOUNTER — Ambulatory Visit (INDEPENDENT_AMBULATORY_CARE_PROVIDER_SITE_OTHER): Payer: BC Managed Care – PPO | Admitting: General Surgery

## 2016-07-27 VITALS — BP 122/78 | HR 74 | Resp 12 | Ht 59.0 in | Wt 145.0 lb

## 2016-07-27 DIAGNOSIS — D2111 Benign neoplasm of connective and other soft tissue of right upper limb, including shoulder: Secondary | ICD-10-CM | POA: Diagnosis not present

## 2016-07-27 DIAGNOSIS — R2231 Localized swelling, mass and lump, right upper limb: Secondary | ICD-10-CM

## 2016-07-27 NOTE — Patient Instructions (Signed)
Return as needed

## 2016-07-27 NOTE — Progress Notes (Signed)
Patient ID: Jennifer Kirby, female   DOB: July 03, 1956, 60 y.o.   MRN: [id number]  Chief Complaint  Patient presents with  . Other    lipoma    HPI Jennifer Kirby is a 60 y.o. female here today for a evaluation of a right shoulder lipoma. Patient states she noticed this area about five years, in the last two month the area has got bigger and tender to move her right shoulder. HPI  Past Medical History:  Diagnosis Date  . Hyperlipidemia     Past Surgical History:  Procedure Laterality Date  . FOOT SURGERY Left    plantar fasciatis and broken foot    No family history on file.  Social History Social History  Substance Use Topics  . Smoking status: Never Smoker  . Smokeless tobacco: Not on file  . Alcohol use 0.0 oz/week    No Known Allergies  Current Outpatient Prescriptions  Medication Sig Dispense Refill  . clotrimazole-betamethasone (LOTRISONE) cream Apply 1 application topically 2 (two) times daily. 30 g 0  . hydrocortisone (ANUSOL-HC) 2.5 % rectal cream Place 1 application rectally 2 (two) times daily. 30 g 1  . montelukast (SINGULAIR) 10 MG tablet Take 1 tablet (10 mg total) by mouth at bedtime. 90 tablet 1  . Nutritional Supplements (ESTROVEN PM PO) Take 1 tablet by mouth daily. otc    . omeprazole (PRILOSEC) 20 MG capsule Take 1 capsule (20 mg total) by mouth daily. 90 capsule 1  . senna-docusate (SENOKOT-S) 8.6-50 MG tablet Take 1 tablet by mouth daily. 90 tablet 1  . simvastatin (ZOCOR) 10 MG tablet TAKE 1 TABLET (10 MG TOTAL) BY MOUTH AT BEDTIME. 90 tablet 1   No current facility-administered medications for this visit.     Review of Systems Review of Systems  Constitutional: Negative.   Respiratory: Negative.   Cardiovascular: Negative.     Blood pressure 122/78, pulse 74, resp. rate 12, height 4\' 11"  (1.499 m), weight 145 lb (65.8 kg).  Physical Exam Physical Exam  Constitutional: She is oriented to person, place, and time. She appears  well-developed and well-nourished.  Eyes: Conjunctivae are normal. No scleral icterus.  Neck: Neck supple.  Cardiovascular: Normal rate, regular rhythm and normal heart sounds.   Pulmonary/Chest: Effort normal and breath sounds normal.    Lymphadenopathy:    She has no cervical adenopathy.  Neurological: She is alert and oriented to person, place, and time.  Skin: Skin is warm and dry.       Data Reviewed PCP note of 07/15/2016.  Assessment    Soft tissue mass anterior to the right shoulder.    Plan    The area appears to represent a lipoma. Excision was offered for relief of discomfort and was accepted. The area was cleansed with ChloraPrep. 20 mL of 0.5% Xylocaine with 0.25% Marcaine was used for initial anesthesia. This was supplemented with additional 5 mL with a good block obtained. A skin line incision was made over the mass. No bleeding was noted. A 5 cm multilobulated mass was resected with hemostasis achieved with 3-0 Vicryl ties. The deep tissue was approximated with a single 3-0 Vicryl suture. The skin was closed with a running 4-0 Vicryl subcuticular suture. Benzoin, Steri-Strips, Telfa and Tegaderm dressing applied. Ice pack provided.  Postoperative wound care was reviewed with the patient.  She'll call the office if she has any concerns, she is offered a no charge postop visit with the nursing staff in 1 week if desired.  This information has been scribed by Gaspar Cola CMA.   Robert Bellow 07/27/2016, 10:02 PM

## 2016-07-28 ENCOUNTER — Ambulatory Visit
Admission: RE | Admit: 2016-07-28 | Discharge: 2016-07-28 | Disposition: A | Discharge status: Home / Self Care | Payer: BC Managed Care – PPO | Source: Ambulatory Visit | Attending: Family Medicine | Admitting: Family Medicine

## 2016-07-28 ENCOUNTER — Other Ambulatory Visit: Payer: Self-pay | Admitting: Family Medicine

## 2016-07-28 DIAGNOSIS — Z1239 Encounter for other screening for malignant neoplasm of breast: Secondary | ICD-10-CM

## 2016-07-28 DIAGNOSIS — Z1231 Encounter for screening mammogram for malignant neoplasm of breast: Secondary | ICD-10-CM | POA: Diagnosis present

## 2016-07-30 ENCOUNTER — Telehealth: Payer: Self-pay

## 2016-07-30 NOTE — Telephone Encounter (Signed)
-----   Message from Robert Bellow, MD sent at 07/30/2016  1:08 PM EDT ----- Please notify pathology was fine.  ----- Message ----- From: Interface, [facility] In [facility] Sent: 07/29/2016  12:19 PM To: Robert Bellow, MD

## 2016-07-30 NOTE — Telephone Encounter (Signed)
Notified patient as instructed, patient pleased. Discussed follow-up if needed. Patient states area does not hurt, very pleased.

## 2016-09-10 ENCOUNTER — Other Ambulatory Visit: Payer: Self-pay

## 2016-09-10 MED ORDER — EZETIMIBE 10 MG PO TABS
10.0000 mg | ORAL_TABLET | Freq: Every day | ORAL | 1 refills | Status: DC
Start: 2016-09-10 — End: 2016-11-23

## 2016-11-23 ENCOUNTER — Ambulatory Visit (INDEPENDENT_AMBULATORY_CARE_PROVIDER_SITE_OTHER): Payer: BC Managed Care – PPO | Admitting: Family Medicine

## 2016-11-23 ENCOUNTER — Encounter: Payer: Self-pay | Admitting: Family Medicine

## 2016-11-23 VITALS — BP 120/80 | HR 80 | Ht 59.0 in | Wt 148.0 lb

## 2016-11-23 DIAGNOSIS — J0110 Acute frontal sinusitis, unspecified: Secondary | ICD-10-CM | POA: Diagnosis not present

## 2016-11-23 DIAGNOSIS — J40 Bronchitis, not specified as acute or chronic: Secondary | ICD-10-CM | POA: Diagnosis not present

## 2016-11-23 MED ORDER — AMOXICILLIN 500 MG PO CAPS: 500.0000 mg | ORAL_CAPSULE | Freq: Three times a day (TID) | ORAL | 0 refills | Status: DC

## 2016-11-23 MED ORDER — GUAIFENESIN-CODEINE 100-10 MG/5ML PO SYRP: 5.0000 mL | ORAL_SOLUTION | Freq: Three times a day (TID) | ORAL | 0 refills | Status: DC | PRN

## 2016-11-23 NOTE — Progress Notes (Signed)
Name: Jennifer Kirby   MRN: [id number]    DOB: 05-10-1956   Date:11/23/2016       Progress Note  Subjective  Chief Complaint  Chief Complaint  Patient presents with  . Sinusitis    cough and cong-     Sinusitis  This is a new problem. The current episode started in the past 7 days. The problem has been gradually worsening since onset. There has been no fever. Her pain is at a severity of 1/10. The pain is mild. Associated symptoms include chills, congestion, coughing, headaches, a hoarse voice, sinus pressure, sneezing and a sore throat. Pertinent negatives include no diaphoresis, ear pain, neck pain, shortness of breath or swollen glands. Treatments tried: advil/ robitussin. The treatment provided mild relief.    No problem-specific Assessment & Plan notes found for this encounter.   Past Medical History:  Diagnosis Date  . Hyperlipidemia     Past Surgical History:  Procedure Laterality Date  . FOOT SURGERY Left    plantar fasciatis and broken foot    Family History  Problem Relation Age of Onset  . Breast cancer Neg Hx     Social History   Social History  . Marital status: Married    Spouse name: N/A  . Number of children: N/A  . Years of education: N/A   Occupational History  . Not on file.   Social History Main Topics  . Smoking status: Never Smoker  . Smokeless tobacco: Never Used  . Alcohol use 0.0 oz/week  . Drug use: No  . Sexual activity: No   Other Topics Concern  . Not on file   Social History Narrative  . No narrative on file    Allergies  Allergen Reactions  . Statins   . Zetia [Ezetimibe] Other (See Comments)    Gave leg cramps    Outpatient Medications Prior to Visit  Medication Sig Dispense Refill  . hydrocortisone (ANUSOL-HC) 2.5 % rectal cream Place 1 application rectally 2 (two) times daily. 30 g 1  . montelukast (SINGULAIR) 10 MG tablet Take 1 tablet (10 mg total) by mouth at bedtime. 90 tablet 1  . omeprazole  (PRILOSEC) 20 MG capsule Take 1 capsule (20 mg total) by mouth daily. 90 capsule 1  . senna-docusate (SENOKOT-S) 8.6-50 MG tablet Take 1 tablet by mouth daily. 90 tablet 1  . clotrimazole-betamethasone (LOTRISONE) cream Apply 1 application topically 2 (two) times daily. 30 g 0  . ezetimibe (ZETIA) 10 MG tablet Take 1 tablet (10 mg total) by mouth daily. 30 tablet 1  . Nutritional Supplements (ESTROVEN PM PO) Take 1 tablet by mouth daily. otc     No facility-administered medications prior to visit.     Review of Systems  Constitutional: Positive for chills. Negative for diaphoresis, fever, malaise/fatigue and weight loss.  HENT: Positive for congestion, hoarse voice, sinus pressure, sneezing and sore throat. Negative for ear discharge and ear pain.   Eyes: Negative for blurred vision.  Respiratory: Positive for cough. Negative for sputum production, shortness of breath and wheezing.   Cardiovascular: Negative for chest pain, palpitations and leg swelling.  Gastrointestinal: Negative for abdominal pain, blood in stool, constipation, diarrhea, heartburn, melena and nausea.  Genitourinary: Negative for dysuria, frequency, hematuria and urgency.  Musculoskeletal: Negative for back pain, joint pain, myalgias and neck pain.  Skin: Negative for rash.  Neurological: Positive for headaches. Negative for dizziness, tingling, sensory change and focal weakness.  Endo/Heme/Allergies: Negative for environmental allergies and polydipsia. Does  not bruise/bleed easily.  Psychiatric/Behavioral: Negative for depression and suicidal ideas. The patient is not nervous/anxious and does not have insomnia.      Objective  Vitals:   11/23/16 1534  BP: 120/80  Pulse: 80  Weight: 148 lb (67.1 kg)  Height: 4\' 11"  (1.499 m)    Physical Exam  Constitutional: She is well-developed, well-nourished, and in no distress. No distress.  HENT:  Head: Normocephalic and atraumatic.  Right Ear: External ear normal.   Left Ear: External ear normal.  Nose: Right sinus exhibits frontal sinus tenderness. Left sinus exhibits frontal sinus tenderness.  Mouth/Throat: Oropharynx is clear and moist.  Eyes: Conjunctivae and EOM are normal. Pupils are equal, round, and reactive to light. Right eye exhibits no discharge. Left eye exhibits no discharge.  Neck: Normal range of motion. Neck supple. No JVD present. No thyromegaly present.  Cardiovascular: Normal rate, regular rhythm, normal heart sounds and intact distal pulses.  Exam reveals no gallop and no friction rub.   No murmur heard. Pulmonary/Chest: Effort normal. She has wheezes. She has no rales.  Abdominal: Soft. Bowel sounds are normal. She exhibits no mass. There is no tenderness. There is no guarding.  Musculoskeletal: Normal range of motion. She exhibits no edema.  Lymphadenopathy:    She has no cervical adenopathy.  Neurological: She is alert. She has normal reflexes.  Skin: Skin is warm and dry. She is not diaphoretic.  Psychiatric: Mood and affect normal.  Nursing note and vitals reviewed.     Assessment & Plan  Problem List Items Addressed This Visit    None    Visit Diagnoses    Acute frontal sinusitis, recurrence not specified    -  Primary   Relevant Medications   amoxicillin (AMOXIL) 500 MG capsule   guaiFENesin-codeine (ROBITUSSIN AC) 100-10 MG/5ML syrup   Bronchitis       Relevant Medications   amoxicillin (AMOXIL) 500 MG capsule   guaiFENesin-codeine (ROBITUSSIN AC) 100-10 MG/5ML syrup      Meds ordered this encounter  Medications  . amoxicillin (AMOXIL) 500 MG capsule    Sig: Take 1 capsule (500 mg total) by mouth 3 (three) times daily.    Dispense:  30 capsule    Refill:  0  . guaiFENesin-codeine (ROBITUSSIN AC) 100-10 MG/5ML syrup    Sig: Take 5 mLs by mouth 3 (three) times daily as needed for cough.    Dispense:  150 mL    Refill:  0      Dr. Otilio Miu [facility]  11/23/16

## 2016-12-04 ENCOUNTER — Encounter: Payer: Self-pay | Admitting: Obstetrics & Gynecology

## 2016-12-04 ENCOUNTER — Ambulatory Visit (INDEPENDENT_AMBULATORY_CARE_PROVIDER_SITE_OTHER): Payer: BC Managed Care – PPO | Admitting: Obstetrics & Gynecology

## 2016-12-04 VITALS — BP 120/80 | HR 80 | Ht 59.0 in | Wt 150.0 lb

## 2016-12-04 DIAGNOSIS — E669 Obesity, unspecified: Secondary | ICD-10-CM

## 2016-12-04 MED ORDER — PHENTERMINE HCL 37.5 MG PO TABS: ORAL_TABLET | ORAL | 0 refills | Status: DC

## 2016-12-04 MED ORDER — PHENTERMINE HCL 37.5 MG PO TABS: ORAL_TABLET | ORAL | 1 refills | Status: DC

## 2016-12-04 NOTE — Patient Instructions (Signed)

## 2016-12-04 NOTE — Progress Notes (Signed)
  HPI:  Patient is a 61 y.o. G2P0 presenting for follow up evaluation of abnormal weight gain.  The patient has lost 2 pounds over the past 6 weeks due to phenteramine therapy.. She has these associated symptoms: none. She denies side effects.  PMHx: She  has a past medical history of Hyperlipidemia. Also,  has a past surgical history that includes Foot surgery (Left)., family history is not on file.,  reports that she has never smoked. She has never used smokeless tobacco. She reports that she drinks alcohol. She reports that she does not use drugs.  She has a current medication list which includes the following prescription(s): hydrocortisone, montelukast, omeprazole, senna-docusate, amoxicillin, guaifenesin-codeine, and phentermine. Also, is allergic to statins and zetia [ezetimibe].  Review of Systems  Constitutional: Negative for chills, fever and malaise/fatigue.  HENT: Negative for congestion, sinus pain and sore throat.   Eyes: Negative for blurred vision and pain.  Respiratory: Negative for cough and wheezing.   Cardiovascular: Negative for chest pain and leg swelling.  Gastrointestinal: Negative for abdominal pain, constipation, diarrhea, heartburn, nausea and vomiting.  Genitourinary: Negative for dysuria, frequency, hematuria and urgency.  Musculoskeletal: Negative for back pain, joint pain, myalgias and neck pain.  Skin: Negative for itching and rash.  Neurological: Negative for dizziness, tremors and weakness.  Endo/Heme/Allergies: Does not bruise/bleed easily.  Psychiatric/Behavioral: Negative for depression. The patient is not nervous/anxious and does not have insomnia.     Objective: BP 120/80   Pulse 80   Ht 4\' 11"  (1.499 m)   Wt 150 lb (68 kg)   BMI 30.30 kg/m  Filed Weights   12/04/16 1108  Weight: 150 lb (68 kg)   Body mass index is 30.3 kg/m.  Physical examination Constitutional NAD, Conversant  Skin No rashes, lesions or ulceration.   Extremities: Moves  all appropriately.  Normal ROM for age. No lymphadenopathy.  Neuro: Grossly intact  Psych: Oriented to PPT.  Normal mood. Normal affect.   ASSESSMENT:  obesity  Plan: Will continue to assist patient in incorporating positive experiences into her life to promote a positive mental attitude.  Education given regarding appropriate lifestyle changes for weight loss, including regular physical activity, healthy coping strategies, caloric restriction, and healthy eating patterns.  Patient will be continued on current medicine plan with the following changes continue Phenteramine along w diet and exercise.    The risks and benefits as well as side effects of medication, such as Phenteramine or Tenuate, is discussed.  The pros and cons of suppressing appetite and boosting metabolism is counseled.  Risks of tolerance and addiction discussed.  Use of medicine will be short term.  Pt to call with any negative side effects and agrees to keep follow up appointments.  Barnett Applebaum, MD, [facility], [facility] 12/04/2016  11:26 AM

## 2016-12-04 NOTE — Addendum Note (Signed)
Addended by: Gae Dry on: 12/04/2016 01:39 PM   Modules accepted: Level of Service

## 2017-01-07 ENCOUNTER — Other Ambulatory Visit: Payer: Self-pay | Admitting: Family Medicine

## 2017-01-12 ENCOUNTER — Ambulatory Visit
Admission: RE | Admit: 2017-01-12 | Discharge: 2017-01-12 | Disposition: A | Discharge status: Home / Self Care | Payer: BC Managed Care – PPO | Source: Ambulatory Visit | Attending: Family Medicine | Admitting: Family Medicine

## 2017-01-12 ENCOUNTER — Ambulatory Visit (INDEPENDENT_AMBULATORY_CARE_PROVIDER_SITE_OTHER): Payer: BC Managed Care – PPO | Admitting: Family Medicine

## 2017-01-12 ENCOUNTER — Encounter: Payer: Self-pay | Admitting: Family Medicine

## 2017-01-12 VITALS — BP 130/80 | HR 64 | Ht 59.0 in | Wt 145.0 lb

## 2017-01-12 DIAGNOSIS — M5116 Intervertebral disc disorders with radiculopathy, lumbar region: Secondary | ICD-10-CM

## 2017-01-12 DIAGNOSIS — M419 Scoliosis, unspecified: Secondary | ICD-10-CM | POA: Diagnosis not present

## 2017-01-12 MED ORDER — PREDNISONE 10 MG PO TABS: 10.0000 mg | ORAL_TABLET | Freq: Every day | ORAL | 0 refills | Status: DC

## 2017-01-12 MED ORDER — CYCLOBENZAPRINE HCL 10 MG PO TABS: 10.0000 mg | ORAL_TABLET | Freq: Every day | ORAL | 0 refills | Status: DC

## 2017-01-12 MED ORDER — MELOXICAM 15 MG PO TABS: 15.0000 mg | ORAL_TABLET | Freq: Every day | ORAL | 0 refills | Status: DC

## 2017-01-12 MED ORDER — TRAMADOL HCL 50 MG PO TABS
50.0000 mg | ORAL_TABLET | Freq: Three times a day (TID) | ORAL | 0 refills | Status: DC | PRN
Start: 2017-01-12 — End: 2017-07-06

## 2017-01-12 NOTE — Patient Instructions (Signed)
Herniated Disk A herniated disk, also called a ruptured disk or slipped disk, occurs when a disk in the spine bulges out too far. Between the bones in the spine (vertebrae), there are oval disks that are made of a soft, spongy center that is surrounded by a tough outer ring. The disks connect your vertebrae, help your spine move, and absorb shocks from your movement. When you have a herniated disk, the spongy center of the disk bulges out or breaks through the outer ring. It can press on a nerve between the vertebrae and cause pain. This can occur anywhere in the back or neck area, but the lower back is most commonly affected. What are the causes? This condition may be caused by:  Age-related wear and tear. The spongy centers of spinal disks tend to shrink and dry out with age, which makes them more likely to herniate.  Sudden injury, such as a strain or sprain.  What increases the risk? Aging is the main risk factor for a herniated disk. Other risk factors include:  Being a man who is 30-50 years old.  Frequently doing activities that involve heavy lifting, bending, or twisting.  Frequently driving for long hours at a time.  Not getting enough exercise.  Being overweight.  Smoking.  Having a family history of back problems or herniated disks.  Being pregnant or giving birth.  Having poor nutrition.  Being tall.  What are the signs or symptoms? Symptoms may vary depending on where your herniated disk is located.  A herniated disk in the lower back may cause sharp pain in: ? Part of the arm, leg, hip, or buttocks. ? The back of the lower leg (calf). ? The lower back, spreading down through the leg into the foot (sciatica).  A herniated disk in the neck may cause dizziness and vertigo. It may also cause pain or weakness in: ? The neck. ? The shoulder blades. ? Upper arm, forearm, or fingers.  You may also have muscle weakness. It may be difficult to: ? Lift your leg or  arm. ? Stand on your toes. ? Squeeze tightly with one of your hands.  Other symptoms may include: ? Numbness or tingling in the affected areas of the hands, arms, feet, or legs. ? Inability to control when you urinate or when you have bowel movements. This is a rare but serious sign of a severe herniated disk in the lower back.  How is this diagnosed? This condition may be diagnosed based on:  Your symptoms.  Your medical history.  A physical exam. The exam may include: ? Straight-leg test. You will lie on your back while your health care provider lifts your leg, keeping your knee straight. If you feel pain, you likely have a herniated disk. ? Neurological tests. This includes checking for numbness, reflexes, muscle strength, and posture.  Imaging tests, such as: ? X-rays. ? MRI. ? CT scan. ? Electromyogram (EMG) to check the nerves that control muscles. This test may be used to determine which nerves are affected by your herniated disk.  How is this treated? Treatment for this condition may include:  A short period of rest. This is usually the first treatment. ? You may be on bed rest for up to 2 days, or you may be instructed to stay home and avoid physical activity. ? If you have a herniated disk in your lower back, avoid sitting as much as possible. Sitting increases pressure on the disk.  Medicines. These may   include: ? NSAIDs to help reduce pain and swelling. ? Muscle relaxants to prevent sudden tightening of the back muscles (back spasms). ? Prescription pain medicines, if you have severe pain.  Steroid injections in the area of the herniated disk. This can help reduce pain and swelling.  Physical therapy to strengthen your back muscles.  In many cases, symptoms go away with treatment over a period of days or weeks. You will most likely be free of symptoms after 3-4 months. If other treatments do not help to relieve your symptoms, you may need surgery. Follow these  instructions at home: Medicines  Take over-the-counter and prescription medicines only as told by your health care provider.  Do not drive or use heavy machinery while taking prescription pain medicine. Activity  Rest as directed.  After your rest period: ? Return to your normal activities and gradually begin exercising as told by your health care provider. Ask your health care provider what activities and exercises are safe for you. ? Use good posture. ? Avoid movements that cause pain. ? Do not lift anything that is heavier than 10 lb (4.5 kg) until your health care provider says this is safe. ? Do not sit or stand for long periods of time without changing positions. ? Do not sit for long periods of time without getting up and moving around.  If physical therapy was prescribed, do exercises as instructed.  Aim to strengthen muscles in your back and abdomen with exercises like crunches, swimming, or walking. General instructions  Do not use any products that contain nicotine or tobacco, such as cigarettes and e-cigarettes. These products can delay healing. If you need help quitting, ask your health care provider.  Do not wear high-heeled shoes.  Do not sleep on your belly.  If you are overweight, work with your health care provider to lose weight safely.  To prevent or treat constipation while you are taking prescription pain medicine, your health care provider may recommend that you: ? Drink enough fluid to keep your urine clear or pale yellow. ? Take over-the-counter or prescription medicines. ? Eat foods that are high in fiber, such as fresh fruits and vegetables, whole grains, and beans. ? Limit foods that are high in fat and processed sugars, such as fried and sweet foods.  Keep all follow-up visits as told by your health care provider. This is important. How is this prevented?  Maintain a healthy weight.  Try to avoid stressful situations.  Maintain physical  fitness. Do at least 150 minutes of moderate-intensity exercise each week, such as brisk walking or water aerobics.  When lifting objects: ? Keep your feet at least shoulder-width apart and tighten your abdominal muscles. ? Keep your spine neutral as you bend your knees and hips. It is important to lift using the strength of your legs, not your back. Do not lock your knees straight out. ? Always ask for help to lift heavy or awkward objects. Contact a health care provider if:  You have back pain or neck pain that does not get better after 6 weeks.  You have severe pain in your back, neck, legs, or arms.  You develop numbness, tingling, or weakness in any part of your body. Get help right away if:  You cannot move your arms or legs.  You cannot control when you urinate or have bowel movements.  You feel dizzy or you faint.  You have shortness of breath. This information is not intended to replace advice given   by your health care provider. Make sure you discuss any questions you have with your health care provider. Document Released: 09/11/2000 Document Revised: 05/11/2016 Document Reviewed: 05/11/2016 Elsevier Interactive Patient Education  2017 [employer]  Low Back Pain: Brief Version   What is low back pain? How does it happen?  Low back pain is pain or stiffness in the lower back. Most of the time, it is caused when a muscle in your back is strained. For example, it can happen when you lift a heavy object or when you sit or stand for a long time. Health problems, such as arthritis, can also cause back pain. Low back pain may last a day or two, several weeks, or longer. You may have pain in one spot or it may spread down the buttocks and into your legs. You should see your health care provider right away if you have back pain with these symptoms:   You cannot control your bladder or bowels.  You have a hard time moving your legs or walking.  Your arms or legs are numb or  tingling.  These symptoms may mean you have hurt your spine and nerves. When you see your health care provider, he or she will:   Ask about your symptoms.  Give you an exam. X-rays or other tests may also be done.  How is it treated?  Here are some good ideas for taking care of low back pain:   Use an electric heating pad on a low setting (or a hot water bottle wrapped in a towel) for 20 to 30 minutes. (Don't let the heating pad get too hot, and don't fall asleep with it. You could get a burn.)  Use an ice pack wrapped in a towel for 20 minutes, one to four times a day. (Don't leave it on too long. You could get frostbite. Set an alarm to remind you.)  Take aspirin, ibuprofen, or other pain medicines your health care provider may suggest.  Ask about exercises you can do to stretch and strengthen your back muscles.  When you sleep or lie down, keep these hints in mind:   Rest on a firm mattress. It may help to lie on your back with your knees raised or lie on your side with your knees bent.  Put a pillow under your knees when you are lying down.  Sleep without a pillow under your head. Talk to your health care provider about whether it would help to:  Wear a belt or corset to support your back.  Make visits to a physical therapist for traction.  Have your back massaged by a trained person. Take it easy at first. As you start to feel better, you'll be able to do more and more. But be careful. You may need to cut back on what you do:  If your symptoms come back.  If you have more pain after you start doing more or something new.  See your health care provider if your pain is worse even with treatment. How can I take care of myself?  You can lower the strain on your back. Here are some ideas that can help:   Try to get to and keep a healthy weight.  Use good posture. Stand with your head up, shoulders straight, chest forward, your weight on both feet, and your pelvis  tucked in.  Sit in a straight-backed chair and hold your spine against the back of the chair.  Sit close to the pedals when  you drive. Use your seat belt and a hard backrest or pillow.  Use a footrest for one foot when you stand or sit in one spot for a long time. This keeps your back straight.  Bend your knees when you bend over.  Here are tips when you need to lift or move heavy objects:   Don't push with your arms when you move a heavy object. Turn around and push backwards so your legs take the strain.  Bend your knees and hips and keep your back straight when you lift a heavy object.  Don't lift heavy objects higher than your waist.  Hold packages you carry close to your body, with your arms bent. To rest your back, do these exercises for 5 minutes or longer:  Lie on your back, bend your knees, and put pillows under your knees.  Lie on your back, put a pillow under your neck, bend your knees to a 90- degree angle, and put your lower legs and feet on a chair.  Lie on your back, bend your knees, and bring one knee up to your chest and hold it there. Repeat with the other knee, then bring both knees to your chest. When holding your knee to your chest, grab your thigh rather than your lower leg.  Back Exercises The following exercises strengthen the muscles that help to support the back. They also help to keep the lower back flexible. Doing these exercises can help to prevent back pain or lessen existing pain. If you have back pain or discomfort, try doing these exercises 2-3 times each day or as told by your health care provider. When the pain goes away, do them once each day, but increase the number of times that you repeat the steps for each exercise (do more repetitions). If you do not have back pain or discomfort, do these exercises once each day or as told by your health care provider. Exercises Single Knee to Chest   Repeat these steps 3-5 times for each leg: 1. Lie on your  back on a firm bed or the floor with your legs extended. 2. Bring one knee to your chest. Your other leg should stay extended and in contact with the floor. 3. Hold your knee in place by grabbing your knee or thigh. 4. Pull on your knee until you feel a gentle stretch in your lower back. 5. Hold the stretch for 10-30 seconds. 6. Slowly release and straighten your leg. Pelvic Tilt   Repeat these steps 5-10 times: 1. Lie on your back on a firm bed or the floor with your legs extended. 2. Bend your knees so they are pointing toward the ceiling and your feet are flat on the floor. 3. Tighten your lower abdominal muscles to press your lower back against the floor. This motion will tilt your pelvis so your tailbone points up toward the ceiling instead of pointing to your feet or the floor. 4. With gentle tension and even breathing, hold this position for 5-10 seconds. Cat-Cow   Repeat these steps until your lower back becomes more flexible: 1. Get into a hands-and-knees position on a firm surface. Keep your hands under your shoulders, and keep your knees under your hips. You may place padding under your knees for comfort. 2. Let your head hang down, and point your tailbone toward the floor so your lower back becomes rounded like the back of a cat. 3. Hold this position for 5 seconds. 4. Slowly lift your head  and point your tailbone up toward the ceiling so your back forms a sagging arch like the back of a cow. 5. Hold this position for 5 seconds. Press-Ups   Repeat these steps 5-10 times: 1. Lie on your abdomen (face-down) on the floor. 2. Place your palms near your head, about shoulder-width apart. 3. While you keep your back as relaxed as possible and keep your hips on the floor, slowly straighten your arms to raise the top half of your body and lift your shoulders. Do not use your back muscles to raise your upper torso. You may adjust the placement of your hands to make yourself more  comfortable. 4. Hold this position for 5 seconds while you keep your back relaxed. 5. Slowly return to lying flat on the floor. Bridges   Repeat these steps 10 times: 1. Lie on your back on a firm surface. 2. Bend your knees so they are pointing toward the ceiling and your feet are flat on the floor. 3. Tighten your buttocks muscles and lift your buttocks off of the floor until your waist is at almost the same height as your knees. You should feel the muscles working in your buttocks and the back of your thighs. If you do not feel these muscles, slide your feet 1-2 inches farther away from your buttocks. 4. Hold this position for 3-5 seconds. 5. Slowly lower your hips to the starting position, and allow your buttocks muscles to relax completely. If this exercise is too easy, try doing it with your arms crossed over your chest. Abdominal Crunches   Repeat these steps 5-10 times: 1. Lie on your back on a firm bed or the floor with your legs extended. 2. Bend your knees so they are pointing toward the ceiling and your feet are flat on the floor. 3. Cross your arms over your chest. 4. Tip your chin slightly toward your chest without bending your neck. 5. Tighten your abdominal muscles and slowly raise your trunk (torso) high enough to lift your shoulder blades a tiny bit off of the floor. Avoid raising your torso higher than that, because it can put too much stress on your low back and it does not help to strengthen your abdominal muscles. 6. Slowly return to your starting position. Back Lifts  Repeat these steps 5-10 times: 1. Lie on your abdomen (face-down) with your arms at your sides, and rest your forehead on the floor. 2. Tighten the muscles in your legs and your buttocks. 3. Slowly lift your chest off of the floor while you keep your hips pressed to the floor. Keep the back of your head in line with the curve in your back. Your eyes should be looking at the floor. 4. Hold this position  for 3-5 seconds. 5. Slowly return to your starting position. Contact a health care provider if:  Your back pain or discomfort gets much worse when you do an exercise.  Your back pain or discomfort does not lessen within 2 hours after you exercise. If you have any of these problems, stop doing these exercises right away. Do not do them again unless your health care provider says that you can. Get help right away if:  You develop sudden, severe back pain. If this happens, stop doing the exercises right away. Do not do them again unless your health care provider says that you can. This information is not intended to replace advice given to you by your health care provider. Make sure you discuss  any questions you have with your health care provider. Document Released: 10/22/2004 Document Revised: 01/22/2016 Document Reviewed: 11/08/2014 Elsevier Interactive Patient Education  2017 [employer].

## 2017-01-12 NOTE — Progress Notes (Signed)
Name: Jennifer Kirby   MRN: [id number]    DOB: 1955-12-24   Date:01/12/2017       Progress Note  Subjective  Chief Complaint  Chief Complaint  Patient presents with  . Back Pain    radiating down R) leg- can't clog due to pain    Back Pain  This is a recurrent problem. The current episode started in the past 7 days. The problem occurs intermittently. The problem has been gradually worsening since onset. The pain is present in the lumbar spine. The quality of the pain is described as aching. The pain radiates to the right thigh. The pain is at a severity of 8/10. The pain is moderate. The pain is worse during the day. The symptoms are aggravated by bending and twisting. Pertinent negatives include no abdominal pain, bladder incontinence, bowel incontinence, chest pain, dysuria, fever, headaches, numbness, paresis, paresthesias, perianal numbness, tingling, weakness or weight loss. She has tried analgesics, NSAIDs and muscle relaxant for the symptoms. The treatment provided mild relief.    No problem-specific Assessment & Plan notes found for this encounter.   Past Medical History:  Diagnosis Date  . Hyperlipidemia     Past Surgical History:  Procedure Laterality Date  . FOOT SURGERY Left    plantar fasciatis and broken foot    Family History  Problem Relation Age of Onset  . Breast cancer Neg Hx     Social History   Social History  . Marital status: Widowed    Spouse name: N/A  . Number of children: N/A  . Years of education: N/A   Occupational History  . Not on file.   Social History Main Topics  . Smoking status: Never Smoker  . Smokeless tobacco: Never Used  . Alcohol use 0.0 oz/week  . Drug use: No  . Sexual activity: No   Other Topics Concern  . Not on file   Social History Narrative  . No narrative on file    Allergies  Allergen Reactions  . Statins   . Zetia [Ezetimibe] Other (See Comments)    Gave leg cramps    Outpatient Medications  Prior to Visit  Medication Sig Dispense Refill  . hydrocortisone (ANUSOL-HC) 2.5 % rectal cream Place 1 application rectally 2 (two) times daily. 30 g 1  . montelukast (SINGULAIR) 10 MG tablet Take 1 tablet (10 mg total) by mouth at bedtime. 90 tablet 1  . omeprazole (PRILOSEC) 20 MG capsule TAKE 1 CAPSULE (20 MG TOTAL) BY MOUTH DAILY. 90 capsule 1  . phentermine (ADIPEX-P) 37.5 MG tablet 30TAKE 1 TABLET BY MOUTH EVERY DAY BEFORE BREAKFAST 30 tablet 1  . senna-docusate (SENOKOT-S) 8.6-50 MG tablet Take 1 tablet by mouth daily. (Patient taking differently: Take 2 tablets by mouth daily. ) 90 tablet 1  . amoxicillin (AMOXIL) 500 MG capsule Take 1 capsule (500 mg total) by mouth 3 (three) times daily. (Patient not taking: Reported on 12/04/2016) 30 capsule 0  . guaiFENesin-codeine (ROBITUSSIN AC) 100-10 MG/5ML syrup Take 5 mLs by mouth 3 (three) times daily as needed for cough. (Patient not taking: Reported on 12/04/2016) 150 mL 0   No facility-administered medications prior to visit.     Review of Systems  Constitutional: Negative for chills, fever, malaise/fatigue and weight loss.  HENT: Negative for ear discharge, ear pain and sore throat.   Eyes: Negative for blurred vision.  Respiratory: Negative for cough, sputum production, shortness of breath and wheezing.   Cardiovascular: Negative for chest pain, palpitations  and leg swelling.  Gastrointestinal: Negative for abdominal pain, blood in stool, bowel incontinence, constipation, diarrhea, heartburn, melena and nausea.  Genitourinary: Negative for bladder incontinence, dysuria, frequency, hematuria and urgency.  Musculoskeletal: Positive for back pain and myalgias. Negative for joint pain and neck pain.  Skin: Negative for rash.  Neurological: Negative for dizziness, tingling, sensory change, focal weakness, weakness, numbness, headaches and paresthesias.  Endo/Heme/Allergies: Negative for environmental allergies and polydipsia. Does not  bruise/bleed easily.  Psychiatric/Behavioral: Negative for depression and suicidal ideas. The patient is not nervous/anxious and does not have insomnia.      Objective  Vitals:   01/12/17 0901  BP: 130/80  Pulse: 64  Weight: 145 lb (65.8 kg)  Height: 4\' 11"  (1.499 m)    Physical Exam  Constitutional: She is well-developed, well-nourished, and in no distress. No distress.  HENT:  Head: Normocephalic and atraumatic.  Right Ear: External ear normal.  Left Ear: External ear normal.  Nose: Nose normal.  Mouth/Throat: Oropharynx is clear and moist.  Eyes: Conjunctivae and EOM are normal. Pupils are equal, round, and reactive to light. Right eye exhibits no discharge. Left eye exhibits no discharge.  Neck: Normal range of motion. Neck supple. No JVD present. No thyromegaly present.  Cardiovascular: Normal rate, regular rhythm, normal heart sounds and intact distal pulses.  Exam reveals no gallop and no friction rub.   No murmur heard. Pulmonary/Chest: Effort normal and breath sounds normal. She has no wheezes. She has no rales.  Abdominal: Soft. Bowel sounds are normal. She exhibits no mass. There is no tenderness. There is no guarding.  Musculoskeletal: Normal range of motion. She exhibits no edema.       Lumbar back: She exhibits spasm.  Lymphadenopathy:    She has no cervical adenopathy.  Neurological: She is alert. She has normal sensation, normal strength and normal reflexes. She has a normal Straight Leg Raise Test.  Reflex Scores:      Patellar reflexes are 2+ on the right side and 2+ on the left side.      Achilles reflexes are 2+ on the right side and 2+ on the left side. Skin: Skin is warm and dry. She is not diaphoretic.  Psychiatric: Mood and affect normal.  Nursing note and vitals reviewed.     Assessment & Plan  Problem List Items Addressed This Visit    None    Visit Diagnoses    Lumbar disc disease with radiculopathy    -  Primary   Relevant Medications    meloxicam (MOBIC) 15 MG tablet   cyclobenzaprine (FLEXERIL) 10 MG tablet   predniSONE (DELTASONE) 10 MG tablet   traMADol (ULTRAM) 50 MG tablet   Other Relevant Orders   DG Lumbar Spine Complete (Completed)      Meds ordered this encounter  Medications  . meloxicam (MOBIC) 15 MG tablet    Sig: Take 1 tablet (15 mg total) by mouth daily.    Dispense:  30 tablet    Refill:  0  . cyclobenzaprine (FLEXERIL) 10 MG tablet    Sig: Take 1 tablet (10 mg total) by mouth at bedtime.    Dispense:  30 tablet    Refill:  0  . predniSONE (DELTASONE) 10 MG tablet    Sig: Take 1 tablet (10 mg total) by mouth daily with breakfast.    Dispense:  30 tablet    Refill:  0  . traMADol (ULTRAM) 50 MG tablet    Sig: Take 1 tablet (50  mg total) by mouth every 8 (eight) hours as needed.    Dispense:  30 tablet    Refill:  0      Dr. Deanna Jones [facility]  01/12/17

## 2017-01-26 ENCOUNTER — Ambulatory Visit (INDEPENDENT_AMBULATORY_CARE_PROVIDER_SITE_OTHER): Payer: BC Managed Care – PPO | Admitting: Family Medicine

## 2017-01-26 VITALS — BP 120/70 | HR 68 | Ht 59.0 in | Wt 145.0 lb

## 2017-01-26 DIAGNOSIS — N3090 Cystitis, unspecified without hematuria: Secondary | ICD-10-CM

## 2017-01-26 LAB — POCT URINALYSIS DIPSTICK
BILIRUBIN UA: NEGATIVE
Glucose, UA: NEGATIVE
Ketones, UA: NEGATIVE
Nitrite, UA: NEGATIVE
PROTEIN UA: NEGATIVE
RBC UA: NEGATIVE
SPEC GRAV UA: 1.02 (ref 1.010–1.025)
Urobilinogen, UA: 0.2 E.U./dL
pH, UA: 5 (ref 5.0–8.0)

## 2017-01-26 MED ORDER — SULFAMETHOXAZOLE-TRIMETHOPRIM 800-160 MG PO TABS: 1.0000 | ORAL_TABLET | Freq: Two times a day (BID) | ORAL | 0 refills | Status: DC

## 2017-01-26 NOTE — Progress Notes (Signed)
Name: Jennifer Kirby   MRN: [id number]    DOB: [birth year]   Date:01/26/2017       Progress Note  Subjective  Chief Complaint  Chief Complaint  Patient presents with  . Urinary Tract Infection    feels like "has to go constantly and hurts to go"    Urinary Tract Infection   This is a new problem. The current episode started in the past 7 days (sunday). The problem occurs intermittently. The problem has been gradually worsening. The quality of the pain is described as burning. The pain is mild. There has been no fever. Pertinent negatives include no chills, discharge, flank pain, frequency, hematuria, hesitancy, nausea, possible pregnancy, sweats, urgency or vomiting. She has tried nothing for the symptoms. The treatment provided mild relief.    No problem-specific Assessment & Plan notes found for this encounter.   Past Medical History:  Diagnosis Date  . Hyperlipidemia     Past Surgical History:  Procedure Laterality Date  . FOOT SURGERY Left    plantar fasciatis and broken foot    Family History  Problem Relation Age of Onset  . Breast cancer Neg Hx     Social History   Social History  . Marital status: Widowed    Spouse name: N/A  . Number of children: N/A  . Years of education: N/A   Occupational History  . Not on file.   Social History Main Topics  . Smoking status: Never Smoker  . Smokeless tobacco: Never Used  . Alcohol use 0.0 oz/week  . Drug use: No  . Sexual activity: No   Other Topics Concern  . Not on file   Social History Narrative  . No narrative on file    Allergies  Allergen Reactions  . Statins   . Zetia [Ezetimibe] Other (See Comments)    Gave leg cramps    Outpatient Medications Prior to Visit  Medication Sig Dispense Refill  . cyclobenzaprine (FLEXERIL) 10 MG tablet Take 1 tablet (10 mg total) by mouth at bedtime. 30 tablet 0  . hydrocortisone (ANUSOL-HC) 2.5 % rectal cream Place 1 application rectally 2 (two) times daily.  30 g 1  . meloxicam (MOBIC) 15 MG tablet Take 1 tablet (15 mg total) by mouth daily. 30 tablet 0  . montelukast (SINGULAIR) 10 MG tablet Take 1 tablet (10 mg total) by mouth at bedtime. 90 tablet 1  . omeprazole (PRILOSEC) 20 MG capsule TAKE 1 CAPSULE (20 MG TOTAL) BY MOUTH DAILY. 90 capsule 1  . phentermine (ADIPEX-P) 37.5 MG tablet 30TAKE 1 TABLET BY MOUTH EVERY DAY BEFORE BREAKFAST 30 tablet 1  . predniSONE (DELTASONE) 10 MG tablet Take 1 tablet (10 mg total) by mouth daily with breakfast. 30 tablet 0  . senna-docusate (SENOKOT-S) 8.6-50 MG tablet Take 1 tablet by mouth daily. (Patient taking differently: Take 2 tablets by mouth daily. ) 90 tablet 1  . traMADol (ULTRAM) 50 MG tablet Take 1 tablet (50 mg total) by mouth every 8 (eight) hours as needed. (Patient not taking: Reported on 01/26/2017) 30 tablet 0   No facility-administered medications prior to visit.     Review of Systems  Constitutional: Negative for chills, fever, malaise/fatigue and weight loss.  HENT: Negative for ear discharge, ear pain and sore throat.   Eyes: Negative for blurred vision.  Respiratory: Negative for cough, sputum production, shortness of breath and wheezing.   Cardiovascular: Negative for chest pain, palpitations and leg swelling.  Gastrointestinal: Negative for abdominal pain,  blood in stool, constipation, diarrhea, heartburn, melena, nausea and vomiting.  Genitourinary: Negative for dysuria, flank pain, frequency, hematuria, hesitancy and urgency.  Musculoskeletal: Negative for back pain, joint pain, myalgias and neck pain.  Skin: Negative for rash.  Neurological: Negative for dizziness, tingling, sensory change, focal weakness and headaches.  Endo/Heme/Allergies: Negative for environmental allergies and polydipsia. Does not bruise/bleed easily.  Psychiatric/Behavioral: Negative for depression and suicidal ideas. The patient is not nervous/anxious and does not have insomnia.      Objective  Vitals:    01/26/17 1045  BP: 120/70  Pulse: 68  Weight: 145 lb (65.8 kg)  Height: 4\' 11"  (1.499 m)    Physical Exam  Constitutional: She is well-developed, well-nourished, and in no distress. No distress.  HENT:  Head: Normocephalic and atraumatic.  Right Ear: External ear normal.  Left Ear: External ear normal.  Nose: Nose normal.  Mouth/Throat: Oropharynx is clear and moist.  Eyes: Conjunctivae and EOM are normal. Pupils are equal, round, and reactive to light. Right eye exhibits no discharge. Left eye exhibits no discharge.  Neck: Normal range of motion. Neck supple. No JVD present. No thyromegaly present.  Cardiovascular: Normal rate, regular rhythm, normal heart sounds and intact distal pulses.  Exam reveals no gallop and no friction rub.   No murmur heard. Pulmonary/Chest: Effort normal and breath sounds normal. She has no wheezes. She has no rales.  Abdominal: Soft. Bowel sounds are normal. She exhibits no mass. There is no tenderness. There is no guarding.  Musculoskeletal: Normal range of motion. She exhibits no edema.  Lymphadenopathy:    She has no cervical adenopathy.  Neurological: She is alert. She has normal reflexes.  Skin: Skin is warm and dry. She is not diaphoretic.  Psychiatric: Mood and affect normal.  Nursing note and vitals reviewed.     Assessment & Plan  Problem List Items Addressed This Visit    None    Visit Diagnoses    Cystitis    -  Primary   Relevant Medications   sulfamethoxazole-trimethoprim (BACTRIM DS,SEPTRA DS) 800-160 MG tablet   Other Relevant Orders   POCT Urinalysis Dipstick (Completed)      Meds ordered this encounter  Medications  . sulfamethoxazole-trimethoprim (BACTRIM DS,SEPTRA DS) 800-160 MG tablet    Sig: Take 1 tablet by mouth 2 (two) times daily.    Dispense:  10 tablet    Refill:  0      Dr. Mac[facility]  01/26/17

## 2017-02-01 ENCOUNTER — Ambulatory Visit (INDEPENDENT_AMBULATORY_CARE_PROVIDER_SITE_OTHER): Payer: BC Managed Care – PPO | Admitting: Obstetrics & Gynecology

## 2017-02-01 ENCOUNTER — Encounter: Payer: Self-pay | Admitting: Obstetrics & Gynecology

## 2017-02-01 VITALS — BP 118/80 | HR 97 | Ht 59.0 in | Wt 143.0 lb

## 2017-02-01 DIAGNOSIS — E663 Overweight: Secondary | ICD-10-CM | POA: Diagnosis not present

## 2017-02-01 DIAGNOSIS — E669 Obesity, unspecified: Secondary | ICD-10-CM | POA: Diagnosis not present

## 2017-02-01 MED ORDER — PHENTERMINE HCL 37.5 MG PO TABS: ORAL_TABLET | ORAL | 1 refills | Status: DC

## 2017-02-01 NOTE — Progress Notes (Signed)
History of Present Illness:  Jennifer Kirby is a 61 y.o. who was started on  Phentermine x 3 months for weight loss; had steroids this past month for back issue.  Still feels well and lost 7 lbs.   Current Outpatient Prescriptions on File Prior to Visit  Medication Sig Dispense Refill  . cyclobenzaprine (FLEXERIL) 10 MG tablet Take 1 tablet (10 mg total) by mouth at bedtime. 30 tablet 0  . hydrocortisone (ANUSOL-HC) 2.5 % rectal cream Place 1 application rectally 2 (two) times daily. 30 g 1  . meloxicam (MOBIC) 15 MG tablet Take 1 tablet (15 mg total) by mouth daily. 30 tablet 0  . montelukast (SINGULAIR) 10 MG tablet Take 1 tablet (10 mg total) by mouth at bedtime. 90 tablet 1  . omeprazole (PRILOSEC) 20 MG capsule TAKE 1 CAPSULE (20 MG TOTAL) BY MOUTH DAILY. 90 capsule 1  . predniSONE (DELTASONE) 10 MG tablet Take 1 tablet (10 mg total) by mouth daily with breakfast. 30 tablet 0  . senna-docusate (SENOKOT-S) 8.6-50 MG tablet Take 1 tablet by mouth daily. (Patient taking differently: Take 2 tablets by mouth daily. ) 90 tablet 1  . sulfamethoxazole-trimethoprim (BACTRIM DS,SEPTRA DS) 800-160 MG tablet Take 1 tablet by mouth 2 (two) times daily. 10 tablet 0  . traMADol (ULTRAM) 50 MG tablet Take 1 tablet (50 mg total) by mouth every 8 (eight) hours as needed. (Patient not taking: Reported on 01/26/2017) 30 tablet 0   No current facility-administered medications on file prior to visit.    PMHx: She  has a past medical history of Hyperlipidemia. Also,  has a past surgical history that includes Foot surgery (Left)., family history includes Throat cancer in her father.,  reports that she has never smoked. She has never used smokeless tobacco. She reports that she drinks alcohol. She reports that she does not use drugs.  She has a current medication list which includes the following prescription(s): cyclobenzaprine, hydrocortisone, meloxicam, montelukast, omeprazole, phentermine, prednisone,  senna-docusate, sulfamethoxazole-trimethoprim, and tramadol. Also, is allergic to statins and zetia [ezetimibe].  Review of Systems  Constitutional: Negative for chills, fever and malaise/fatigue.  HENT: Negative for congestion, sinus pain and sore throat.   Eyes: Negative for blurred vision and pain.  Respiratory: Negative for cough and wheezing.   Cardiovascular: Negative for chest pain and leg swelling.  Gastrointestinal: Negative for abdominal pain, constipation, diarrhea, heartburn, nausea and vomiting.  Genitourinary: Negative for dysuria, frequency, hematuria and urgency.  Musculoskeletal: Negative for back pain, joint pain, myalgias and neck pain.  Skin: Negative for itching and rash.  Neurological: Negative for dizziness, tremors and weakness.  Endo/Heme/Allergies: Does not bruise/bleed easily.  Psychiatric/Behavioral: Negative for depression. The patient is not nervous/anxious and does not have insomnia.     Physical Exam:  BP 118/80   Pulse 97   Ht 4\' 11"  (1.499 m)   Wt 143 lb (64.9 kg)   BMI 28.88 kg/m  Body mass index is 28.88 kg/m. Filed Weights   02/01/17 1447  Weight: 143 lb (64.9 kg)    Physical Exam  Constitutional: She is oriented to person, place, and time. She appears well-developed and well-nourished. No distress.  Musculoskeletal: Normal range of motion.  Neurological: She is alert and oriented to person, place, and time.  Skin: Skin is warm and dry.  Psychiatric: She has a normal mood and affect.  Vitals reviewed.   Assessment: overweight Medication treatment is going very well for her.  Plan: Patient is continued/added to prescription appetite  suppressants: Phentermine for 2 more months, then break..   Will continue to assist patient in incorporating positive experiences into her life to promote a positive mental attitude.  Education given regarding appropriate lifestyle changes for weight loss, including regular physical activity, healthy coping  strategies, caloric restriction, and healthy eating patterns.  The risks and benefits as well as side effects of medication, such as Phenteramine or Tenuate, is discussed.  The pros and cons of suppressing appetite and boosting metabolism is counseled.  Risks of tolerance and addiction discussed.  Use of medicine will be short term.  Pt to call with any negative side effects and agrees to keep follow up appointments.  Patient doing well with weight loss in progress. Will stop medicine and allow for a drug-free holiday. Patient understands she may need future therapy if weight gain resumes or she does not reach her weight loss goals on her own with good diet and exercise habits.    Barnett Applebaum, MD, [facility], [facility] 02/01/2017  3:08 PM

## 2017-03-21 ENCOUNTER — Other Ambulatory Visit: Payer: Self-pay | Admitting: Family Medicine

## 2017-03-21 DIAGNOSIS — K644 Residual hemorrhoidal skin tags: Secondary | ICD-10-CM

## 2017-03-21 DIAGNOSIS — K5900 Constipation, unspecified: Secondary | ICD-10-CM

## 2017-04-05 ENCOUNTER — Ambulatory Visit: Payer: BC Managed Care – PPO | Admitting: Obstetrics & Gynecology

## 2017-04-21 ENCOUNTER — Ambulatory Visit (INDEPENDENT_AMBULATORY_CARE_PROVIDER_SITE_OTHER): Payer: BC Managed Care – PPO | Admitting: Obstetrics & Gynecology

## 2017-04-21 ENCOUNTER — Encounter: Payer: Self-pay | Admitting: Obstetrics & Gynecology

## 2017-04-21 VITALS — BP 120/80 | HR 90 | Ht 59.0 in | Wt 138.0 lb

## 2017-04-21 DIAGNOSIS — E663 Overweight: Secondary | ICD-10-CM

## 2017-04-21 NOTE — Progress Notes (Signed)
History of Present Illness:  Jennifer Kirby is a 61 y.o. who was started on  Current Outpatient Prescriptions on File Prior to Visit  Medication Sig Dispense Refill  . [facility] SENNA PLUS 8.6-50 MG tablet TAKE 1 TABLET BY MOUTH DAILY. 90 tablet 1  . cyclobenzaprine (FLEXERIL) 10 MG tablet Take 1 tablet (10 mg total) by mouth at bedtime. 30 tablet 0  . hydrocortisone (ANUSOL-HC) 2.5 % rectal cream Place 1 application rectally 2 (two) times daily. 30 g 1  . meloxicam (MOBIC) 15 MG tablet Take 1 tablet (15 mg total) by mouth daily. 30 tablet 0  . montelukast (SINGULAIR) 10 MG tablet Take 1 tablet (10 mg total) by mouth at bedtime. 90 tablet 1  . omeprazole (PRILOSEC) 20 MG capsule TAKE 1 CAPSULE (20 MG TOTAL) BY MOUTH DAILY. 90 capsule 1  . phentermine (ADIPEX-P) 37.5 MG tablet 30TAKE 1 TABLET BY MOUTH EVERY DAY BEFORE BREAKFAST 30 tablet 1  . predniSONE (DELTASONE) 10 MG tablet Take 1 tablet (10 mg total) by mouth daily with breakfast. 30 tablet 0  . sulfamethoxazole-trimethoprim (BACTRIM DS,SEPTRA DS) 800-160 MG tablet Take 1 tablet by mouth 2 (two) times daily. 10 tablet 0  . traMADol (ULTRAM) 50 MG tablet Take 1 tablet (50 mg total) by mouth every 8 (eight) hours as needed. (Patient not taking: Reported on 01/26/2017) 30 tablet 0   No current facility-administered medications on file prior to visit.    approximately 5 months ago due to obesity/abnormal weight gain. The patient has lost 5 pounds over the past 2 mos due to meds and exercise (reports min dietary changes)..   She has these side effects: none.  PMHx: She  has a past medical history of Hyperlipidemia. Also,  has a past surgical history that includes Foot surgery (Left)., family history includes Throat cancer in her father.,  reports that she has never smoked. She has never used smokeless tobacco. She reports that she drinks alcohol. She reports that she does not use drugs.  She has a current medication list which includes the  following prescription(s): [facility] senna plus, cyclobenzaprine, hydrocortisone, meloxicam, montelukast, omeprazole, phentermine, prednisone, sulfamethoxazole-trimethoprim, and tramadol. Also, is allergic to statins and zetia [ezetimibe].  Review of Systems  All other systems reviewed and are negative.   Physical Exam:  BP 120/80   Pulse 90   Ht 4\' 11"  (1.499 m)   Wt 138 lb (62.6 kg)   BMI 27.87 kg/m  Body mass index is 27.87 kg/m. Filed Weights   04/21/17 1447  Weight: 138 lb (62.6 kg)    Physical Exam  Constitutional: She is oriented to person, place, and time. She appears well-developed and well-nourished. No distress.  Musculoskeletal: Normal range of motion.  Neurological: She is alert and oriented to person, place, and time.  Skin: Skin is warm and dry.  Psychiatric: She has a normal mood and affect.  Vitals reviewed.   Assessment: overweight Medication treatment is going adequately for her.  Plan: Patient will not be continued/added to self-directed dieting and exercise.   Will continue to assist patient in incorporating positive experiences into her life to promote a positive mental attitude.  Education given regarding appropriate lifestyle changes for weight loss, including regular physical activity, healthy coping strategies, caloric restriction, and healthy eating patterns.  Patient doing well with weight loss in progress. Will stop medicine and allow for a drug-free holiday. Patient understands she may need future therapy if weight gain resumes or she does not reach her weight  loss goals on her own with good diet and exercise habits.   Barnett Applebaum, MD, [facility], [facility] 04/21/2017  3:06 PM

## 2017-05-04 ENCOUNTER — Other Ambulatory Visit: Payer: Self-pay | Admitting: Family Medicine

## 2017-05-04 DIAGNOSIS — K644 Residual hemorrhoidal skin tags: Secondary | ICD-10-CM

## 2017-05-04 DIAGNOSIS — K5900 Constipation, unspecified: Secondary | ICD-10-CM

## 2017-07-06 ENCOUNTER — Ambulatory Visit (INDEPENDENT_AMBULATORY_CARE_PROVIDER_SITE_OTHER): Payer: BC Managed Care – PPO | Admitting: Family Medicine

## 2017-07-06 ENCOUNTER — Encounter: Payer: Self-pay | Admitting: Family Medicine

## 2017-07-06 VITALS — BP 120/80 | HR 88 | Ht 59.0 in | Wt 141.0 lb

## 2017-07-06 DIAGNOSIS — K644 Residual hemorrhoidal skin tags: Secondary | ICD-10-CM

## 2017-07-06 DIAGNOSIS — J0100 Acute maxillary sinusitis, unspecified: Secondary | ICD-10-CM

## 2017-07-06 DIAGNOSIS — Z23 Encounter for immunization: Secondary | ICD-10-CM | POA: Diagnosis not present

## 2017-07-06 DIAGNOSIS — K5900 Constipation, unspecified: Secondary | ICD-10-CM

## 2017-07-06 DIAGNOSIS — J40 Bronchitis, not specified as acute or chronic: Secondary | ICD-10-CM

## 2017-07-06 MED ORDER — GUAIFENESIN-CODEINE 100-10 MG/5ML PO SYRP: 5.0000 mL | ORAL_SOLUTION | Freq: Three times a day (TID) | ORAL | 0 refills | Status: DC | PRN

## 2017-07-06 MED ORDER — AMOXICILLIN-POT CLAVULANATE 875-125 MG PO TABS: 1.0000 | ORAL_TABLET | Freq: Two times a day (BID) | ORAL | 0 refills | Status: DC

## 2017-07-06 MED ORDER — SENNOSIDES-DOCUSATE SODIUM 8.6-50 MG PO TABS: 1.0000 | ORAL_TABLET | Freq: Every day | ORAL | 2 refills | Status: DC

## 2017-07-06 NOTE — Progress Notes (Signed)
Name: Jennifer Kirby   MRN: [id number]    DOB: 02-25-1956   Date:07/06/2017       Progress Note  Subjective  Chief Complaint  Chief Complaint  Patient presents with  . Sinusitis    cough and cong- green production    Sinusitis  This is a new problem. The current episode started in the past 7 days. The problem has been gradually worsening since onset. There has been no fever. The pain is mild. Associated symptoms include congestion, coughing, a hoarse voice, sinus pressure and a sore throat. Pertinent negatives include no chills, diaphoresis, ear pain, headaches, neck pain, shortness of breath, sneezing or swollen glands. Past treatments include oral decongestants. The treatment provided moderate relief.  Cough  This is a new problem. The current episode started in the past 7 days. The problem has been gradually worsening. The cough is productive of purulent sputum (thick green). Associated symptoms include a sore throat. Pertinent negatives include no chest pain, chills, ear pain, fever, headaches, heartburn, myalgias, rash, shortness of breath, weight loss or wheezing. The symptoms are aggravated by pollens. She has tried OTC cough suppressant for the symptoms. The treatment provided mild relief. Her past medical history is significant for bronchitis. There is no history of asthma, bronchiectasis, COPD, emphysema, environmental allergies or pneumonia.    No problem-specific Assessment & Plan notes found for this encounter.   Past Medical History:  Diagnosis Date  . Hyperlipidemia     Past Surgical History:  Procedure Laterality Date  . FOOT SURGERY Left    plantar fasciatis and broken foot    Family History  Problem Relation Age of Onset  . Throat cancer Father   . Breast cancer Neg Hx     Social History   Social History  . Marital status: Widowed    Spouse name: N/A  . Number of children: N/A  . Years of education: N/A   Occupational History  . Not on file.    Social History Main Topics  . Smoking status: Never Smoker  . Smokeless tobacco: Never Used  . Alcohol use 0.0 oz/week  . Drug use: No  . Sexual activity: No   Other Topics Concern  . Not on file   Social History Narrative  . No narrative on file    Allergies  Allergen Reactions  . Statins   . Zetia [Ezetimibe] Other (See Comments)    Gave leg cramps    Outpatient Medications Prior to Visit  Medication Sig Dispense Refill  . montelukast (SINGULAIR) 10 MG tablet Take 1 tablet (10 mg total) by mouth at bedtime. 90 tablet 1  . omeprazole (PRILOSEC) 20 MG capsule TAKE 1 CAPSULE (20 MG TOTAL) BY MOUTH DAILY. 90 capsule 1  . [facility] SENNA PLUS 8.6-50 MG tablet TAKE 1 TABLET BY MOUTH EVERY DAY 180 tablet 0  . cyclobenzaprine (FLEXERIL) 10 MG tablet Take 1 tablet (10 mg total) by mouth at bedtime. (Patient not taking: Reported on 07/06/2017) 30 tablet 0  . hydrocortisone (ANUSOL-HC) 2.5 % rectal cream Place 1 application rectally 2 (two) times daily. (Patient not taking: Reported on 07/06/2017) 30 g 1  . meloxicam (MOBIC) 15 MG tablet Take 1 tablet (15 mg total) by mouth daily. 30 tablet 0  . phentermine (ADIPEX-P) 37.5 MG tablet 30TAKE 1 TABLET BY MOUTH EVERY DAY BEFORE BREAKFAST 30 tablet 1  . predniSONE (DELTASONE) 10 MG tablet Take 1 tablet (10 mg total) by mouth daily with breakfast. 30 tablet 0  .  sulfamethoxazole-trimethoprim (BACTRIM DS,SEPTRA DS) 800-160 MG tablet Take 1 tablet by mouth 2 (two) times daily. 10 tablet 0  . traMADol (ULTRAM) 50 MG tablet Take 1 tablet (50 mg total) by mouth every 8 (eight) hours as needed. (Patient not taking: Reported on 01/26/2017) 30 tablet 0   No facility-administered medications prior to visit.     Review of Systems  Constitutional: Negative for chills, diaphoresis, fever, malaise/fatigue and weight loss.  HENT: Positive for congestion, hoarse voice, sinus pressure and sore throat. Negative for ear discharge, ear pain and sneezing.   Eyes:  Negative for blurred vision.  Respiratory: Positive for cough. Negative for sputum production, shortness of breath and wheezing.   Cardiovascular: Negative for chest pain, palpitations and leg swelling.  Gastrointestinal: Negative for abdominal pain, blood in stool, constipation, diarrhea, heartburn, melena and nausea.  Genitourinary: Negative for dysuria, frequency, hematuria and urgency.  Musculoskeletal: Negative for back pain, joint pain, myalgias and neck pain.  Skin: Negative for rash.  Neurological: Negative for dizziness, tingling, sensory change, focal weakness and headaches.  Endo/Heme/Allergies: Negative for environmental allergies and polydipsia. Does not bruise/bleed easily.  Psychiatric/Behavioral: Negative for depression and suicidal ideas. The patient is not nervous/anxious and does not have insomnia.      Objective  Vitals:   07/06/17 1413  BP: 120/80  Pulse: 88  Weight: 141 lb (64 kg)  Height: 4\' 11"  (1.499 m)    Physical Exam  Constitutional: She is well-developed, well-nourished, and in no distress. No distress.  HENT:  Head: Normocephalic and atraumatic.  Right Ear: External ear normal.  Left Ear: External ear normal.  Nose: Right sinus exhibits maxillary sinus tenderness. Left sinus exhibits maxillary sinus tenderness.  Mouth/Throat: Oropharynx is clear and moist.  Eyes: Pupils are equal, round, and reactive to light. Conjunctivae and EOM are normal. Right eye exhibits no discharge. Left eye exhibits no discharge.  Neck: Normal range of motion. Neck supple. No JVD present. No thyromegaly present.  Cardiovascular: Normal rate, regular rhythm, normal heart sounds and intact distal pulses.  Exam reveals no gallop and no friction rub.   No murmur heard. Pulmonary/Chest: Effort normal and breath sounds normal. She has no wheezes. She has no rales.  Abdominal: Soft. Bowel sounds are normal. She exhibits no mass. There is no tenderness. There is no guarding.   Musculoskeletal: Normal range of motion. She exhibits no edema.  Lymphadenopathy:    She has no cervical adenopathy.  Neurological: She is alert. She has normal reflexes.  Skin: Skin is warm and dry. She is not diaphoretic.  Psychiatric: Mood and affect normal.  Nursing note and vitals reviewed.     Assessment & Plan  Problem List Items Addressed This Visit    None    Visit Diagnoses    Bronchitis    -  Primary   Relevant Medications   guaiFENesin-codeine (ROBITUSSIN AC) 100-10 MG/5ML syrup   Acute maxillary sinusitis, recurrence not specified       Relevant Medications   amoxicillin-clavulanate (AUGMENTIN) 875-125 MG tablet   guaiFENesin-codeine (ROBITUSSIN AC) 100-10 MG/5ML syrup   External hemorrhoids       Relevant Medications   senna-docusate ([facility] SENNA PLUS) 8.6-50 MG tablet   Constipation, unspecified constipation type       Relevant Medications   senna-docusate ([facility] SENNA PLUS) 8.6-50 MG tablet   Influenza vaccine needed       Relevant Orders   Flu Vaccine QUAD 36+ mos IM (Completed)      Meds ordered  this encounter  Medications  . senna-docusate ([facility] SENNA PLUS) 8.6-50 MG tablet    Sig: Take 1 tablet by mouth daily.    Dispense:  180 tablet    Refill:  2  . amoxicillin-clavulanate (AUGMENTIN) 875-125 MG tablet    Sig: Take 1 tablet by mouth 2 (two) times daily.    Dispense:  20 tablet    Refill:  0  . guaiFENesin-codeine (ROBITUSSIN AC) 100-10 MG/5ML syrup    Sig: Take 5 mLs by mouth 3 (three) times daily as needed for cough.    Dispense:  150 mL    Refill:  0      Dr. Deanna [facility]  07/06/17

## 2017-07-22 ENCOUNTER — Encounter: Payer: BC Managed Care – PPO | Admitting: Family Medicine

## 2017-07-23 ENCOUNTER — Encounter: Payer: BC Managed Care – PPO | Admitting: Family Medicine

## 2017-07-28 ENCOUNTER — Ambulatory Visit (INDEPENDENT_AMBULATORY_CARE_PROVIDER_SITE_OTHER): Payer: BC Managed Care – PPO | Admitting: Family Medicine

## 2017-07-28 ENCOUNTER — Encounter: Payer: Self-pay | Admitting: Family Medicine

## 2017-07-28 VITALS — BP 120/80 | HR 70 | Ht 59.0 in | Wt 143.0 lb

## 2017-07-28 DIAGNOSIS — Z01419 Encounter for gynecological examination (general) (routine) without abnormal findings: Secondary | ICD-10-CM

## 2017-07-28 DIAGNOSIS — R195 Other fecal abnormalities: Secondary | ICD-10-CM | POA: Diagnosis not present

## 2017-07-28 DIAGNOSIS — Z1239 Encounter for other screening for malignant neoplasm of breast: Secondary | ICD-10-CM

## 2017-07-28 DIAGNOSIS — Z0000 Encounter for general adult medical examination without abnormal findings: Secondary | ICD-10-CM | POA: Diagnosis not present

## 2017-07-28 DIAGNOSIS — R05 Cough: Secondary | ICD-10-CM | POA: Diagnosis not present

## 2017-07-28 DIAGNOSIS — Z124 Encounter for screening for malignant neoplasm of cervix: Secondary | ICD-10-CM

## 2017-07-28 DIAGNOSIS — Z1211 Encounter for screening for malignant neoplasm of colon: Secondary | ICD-10-CM

## 2017-07-28 DIAGNOSIS — Z1231 Encounter for screening mammogram for malignant neoplasm of breast: Secondary | ICD-10-CM

## 2017-07-28 DIAGNOSIS — E663 Overweight: Secondary | ICD-10-CM

## 2017-07-28 DIAGNOSIS — R059 Cough, unspecified: Secondary | ICD-10-CM

## 2017-07-28 LAB — HEMOCCULT GUIAC POC 1CARD (OFFICE): Fecal Occult Blood, POC: POSITIVE — AB

## 2017-07-28 LAB — POCT URINALYSIS DIPSTICK
BILIRUBIN UA: NEGATIVE
Blood, UA: NEGATIVE
GLUCOSE UA: NEGATIVE
Ketones, UA: NEGATIVE
LEUKOCYTES UA: NEGATIVE
Nitrite, UA: NEGATIVE
Protein, UA: NEGATIVE
Spec Grav, UA: 1.02 (ref 1.010–1.025)
Urobilinogen, UA: 0.2 E.U./dL
pH, UA: 6 (ref 5.0–8.0)

## 2017-07-28 MED ORDER — GUAIFENESIN-CODEINE 100-10 MG/5ML PO SYRP: 5.0000 mL | ORAL_SOLUTION | Freq: Three times a day (TID) | ORAL | 0 refills | Status: DC | PRN

## 2017-07-28 NOTE — Progress Notes (Signed)
Name: Jennifer Kirby   MRN: [id number]    DOB: 03/01/1956   Date:07/28/2017       Progress Note  Subjective  Chief Complaint  Chief Complaint  Patient presents with  . Annual Exam    pap and mammo and colonoscopy    Patient presents for annual physical exam.    No problem-specific Assessment & Plan notes found for this encounter.   Past Medical History:  Diagnosis Date  . Hyperlipidemia     Past Surgical History:  Procedure Laterality Date  . FOOT SURGERY Left    plantar fasciatis and broken foot    Family History  Problem Relation Age of Onset  . Throat cancer Father   . Breast cancer Neg Hx     Social History   Social History  . Marital status: Widowed    Spouse name: N/A  . Number of children: N/A  . Years of education: N/A   Occupational History  . Not on file.   Social History Main Topics  . Smoking status: Never Smoker  . Smokeless tobacco: Never Used  . Alcohol use 0.0 oz/week  . Drug use: No  . Sexual activity: No   Other Topics Concern  . Not on file   Social History Narrative  . No narrative on file    Allergies  Allergen Reactions  . Statins   . Zetia [Ezetimibe] Other (See Comments)    Gave leg cramps    Outpatient Medications Prior to Visit  Medication Sig Dispense Refill  . montelukast (SINGULAIR) 10 MG tablet Take 1 tablet (10 mg total) by mouth at bedtime. 90 tablet 1  . omeprazole (PRILOSEC) 20 MG capsule TAKE 1 CAPSULE (20 MG TOTAL) BY MOUTH DAILY. 90 capsule 1  . senna-docusate ([facility] SENNA PLUS) 8.6-50 MG tablet Take 1 tablet by mouth daily. 180 tablet 2  . cyclobenzaprine (FLEXERIL) 10 MG tablet Take 1 tablet (10 mg total) by mouth at bedtime. (Patient not taking: Reported on 07/06/2017) 30 tablet 0  . hydrocortisone (ANUSOL-HC) 2.5 % rectal cream Place 1 application rectally 2 (two) times daily. (Patient not taking: Reported on 07/06/2017) 30 g 1  . amoxicillin-clavulanate (AUGMENTIN) 875-125 MG tablet Take 1 tablet  by mouth 2 (two) times daily. 20 tablet 0  . guaiFENesin-codeine (ROBITUSSIN AC) 100-10 MG/5ML syrup Take 5 mLs by mouth 3 (three) times daily as needed for cough. 150 mL 0   No facility-administered medications prior to visit.     Review of Systems  Constitutional: Negative for chills, fever, malaise/fatigue and weight loss.  HENT: Negative for ear discharge, ear pain and sore throat.   Eyes: Negative for blurred vision.  Respiratory: Positive for cough. Negative for sputum production, shortness of breath and wheezing.   Cardiovascular: Negative for chest pain, palpitations and leg swelling.  Gastrointestinal: Negative for abdominal pain, blood in stool, constipation, diarrhea, heartburn, melena and nausea.  Genitourinary: Negative for dysuria, frequency, hematuria and urgency.  Musculoskeletal: Negative for back pain, joint pain, myalgias and neck pain.  Skin: Negative for rash.  Neurological: Negative for dizziness, tingling, sensory change, focal weakness and headaches.  Endo/Heme/Allergies: Negative for environmental allergies and polydipsia. Does not bruise/bleed easily.  Psychiatric/Behavioral: Negative for depression and suicidal ideas. The patient is not nervous/anxious and does not have insomnia.      Objective  Vitals:   07/28/17 0836  BP: 120/80  Pulse: 70  Weight: 143 lb (64.9 kg)  Height: 4\' 11"  (1.499 m)    Physical Exam  Constitutional: She is well-developed, well-nourished, and in no distress. No distress.  HENT:  Head: Normocephalic and atraumatic.  Right Ear: Tympanic membrane, external ear and ear canal normal.  Left Ear: Tympanic membrane, external ear and ear canal normal.  Nose: Nose normal.  Mouth/Throat: Uvula is midline, oropharynx is clear and moist and mucous membranes are normal.  Eyes: Pupils are equal, round, and reactive to light. Conjunctivae, EOM and lids are normal. Right eye exhibits no discharge. Left eye exhibits no discharge.  Neck:  Normal range of motion. Neck supple. Normal carotid pulses, no hepatojugular reflux and no JVD present. Carotid bruit is not present. No thyromegaly present.  Cardiovascular: Normal rate, regular rhythm, S1 normal, S2 normal, normal heart sounds and intact distal pulses.  Exam reveals no gallop, no S3, no S4 and no friction rub.   No murmur heard. Pulmonary/Chest: Effort normal and breath sounds normal. She has no wheezes. She has no rales. Right breast exhibits no inverted nipple, no mass, no nipple discharge, no skin change and no tenderness. Left breast exhibits no inverted nipple, no mass, no nipple discharge, no skin change and no tenderness. Breasts are symmetrical.  Abdominal: Soft. Normal aorta and bowel sounds are normal. She exhibits no mass. There is no hepatosplenomegaly. There is no tenderness. There is no guarding and no CVA tenderness.  Genitourinary: Vagina normal, uterus normal, cervix normal, right adnexa normal, left adnexa normal and vulva normal. Rectal exam shows external hemorrhoid and guaiac positive stool.  Musculoskeletal: Normal range of motion. She exhibits no edema.  Lymphadenopathy:       Head (right side): No submandibular adenopathy present.    She has no cervical adenopathy.  Neurological: She is alert. She has normal sensation, normal strength, normal reflexes and intact cranial nerves.  Skin: Skin is warm, dry and intact. She is not diaphoretic.  Nails dystrophic  Psychiatric: Mood and affect normal.  Nursing note and vitals reviewed.     Assessment & Plan  Problem List Items Addressed This Visit    None    Visit Diagnoses    Annual physical exam    -  Primary   Relevant Orders   Renal Function Panel   Lipid Profile   POCT urinalysis dipstick (Completed)   Pap IG (Image Guided)   Encounter for gynecological examination without abnormal finding       Relevant Orders   POCT urinalysis dipstick (Completed)   Pap IG (Image Guided)   Colon cancer  screening       Relevant Orders   POCT occult blood stool (Completed)   Ambulatory referral to Gastroenterology   Breast screening       Relevant Orders   MM Digital Screening   Cough       Guaiac positive stools       Relevant Orders   CBC with Differential/Platelet   Ambulatory referral to Gastroenterology   Overweight       Cervical cancer screening       Relevant Orders   Pap IG (Image Guided)      Meds ordered this encounter  Medications  . guaiFENesin-codeine (ROBITUSSIN AC) 100-10 MG/5ML syrup    Sig: Take 5 mLs by mouth 3 (three) times daily as needed for cough.    Dispense:  150 mL    Refill:  0      Dr. Macon [facility]  07/28/17

## 2017-07-29 ENCOUNTER — Other Ambulatory Visit: Payer: Self-pay | Admitting: Family Medicine

## 2017-07-29 LAB — CBC WITH DIFFERENTIAL/PLATELET
BASOS ABS: 0 10*3/uL (ref 0.0–0.2)
Basos: 0 %
EOS (ABSOLUTE): 0.1 10*3/uL (ref 0.0–0.4)
Eos: 2 %
HEMATOCRIT: 41.9 % (ref 34.0–46.6)
Hemoglobin: 14.4 g/dL (ref 11.1–15.9)
IMMATURE GRANULOCYTES: 0 %
Immature Grans (Abs): 0 10*3/uL (ref 0.0–0.1)
LYMPHS ABS: 1.6 10*3/uL (ref 0.7–3.1)
Lymphs: 23 %
MCH: 29.4 pg (ref 26.6–33.0)
MCHC: 34.4 g/dL (ref 31.5–35.7)
MCV: 86 fL (ref 79–97)
Monocytes Absolute: 0.6 10*3/uL (ref 0.1–0.9)
Monocytes: 8 %
NEUTROS ABS: 4.6 10*3/uL (ref 1.4–7.0)
Neutrophils: 67 %
PLATELETS: 263 10*3/uL (ref 150–379)
RBC: 4.89 x10E6/uL (ref 3.77–5.28)
RDW: 14 % (ref 12.3–15.4)
WBC: 6.9 10*3/uL (ref 3.4–10.8)

## 2017-07-29 LAB — PAP IG (IMAGE GUIDED): PAP Smear Comment: 0

## 2017-07-29 LAB — RENAL FUNCTION PANEL
ALBUMIN: 4.4 g/dL (ref 3.6–4.8)
BUN/Creatinine Ratio: 15 (ref 12–28)
BUN: 8 mg/dL (ref 8–27)
CALCIUM: 9.3 mg/dL (ref 8.7–10.3)
CO2: 24 mmol/L (ref 20–29)
Chloride: 101 mmol/L (ref 96–106)
Creatinine, Ser: 0.54 mg/dL — ABNORMAL LOW (ref 0.57–1.00)
GFR calc Af Amer: 116 mL/min/{1.73_m2} (ref >59)
GFR, EST NON AFRICAN AMERICAN: 101 mL/min/{1.73_m2} (ref >59)
Glucose: 82 mg/dL (ref 65–99)
PHOSPHORUS: 3.6 mg/dL (ref 2.5–4.5)
POTASSIUM: 4.2 mmol/L (ref 3.5–5.2)
SODIUM: 141 mmol/L (ref 134–144)

## 2017-07-29 LAB — LIPID PANEL
CHOL/HDL RATIO: 3.5 ratio (ref 0.0–4.4)
CHOLESTEROL TOTAL: 260 mg/dL — AB (ref 100–199)
HDL: 75 mg/dL (ref >39)
LDL Calculated: 166 mg/dL — ABNORMAL HIGH (ref 0–99)
TRIGLYCERIDES: 96 mg/dL (ref 0–149)
VLDL Cholesterol Cal: 19 mg/dL (ref 5–40)

## 2017-08-16 ENCOUNTER — Ambulatory Visit: Payer: BC Managed Care – PPO

## 2017-08-18 ENCOUNTER — Ambulatory Visit: Payer: BC Managed Care – PPO

## 2017-10-14 ENCOUNTER — Other Ambulatory Visit: Payer: Self-pay | Admitting: Family Medicine

## 2017-10-14 ENCOUNTER — Ambulatory Visit
Admission: RE | Admit: 2017-10-14 | Discharge: 2017-10-14 | Disposition: A | Discharge status: Home / Self Care | Payer: BC Managed Care – PPO | Source: Ambulatory Visit | Attending: Family Medicine | Admitting: Family Medicine

## 2017-10-14 DIAGNOSIS — Z1239 Encounter for other screening for malignant neoplasm of breast: Secondary | ICD-10-CM

## 2017-10-14 DIAGNOSIS — Z1231 Encounter for screening mammogram for malignant neoplasm of breast: Secondary | ICD-10-CM | POA: Insufficient documentation

## 2017-11-12 HISTORY — PX: COLONOSCOPY: SHX174

## 2018-02-01 ENCOUNTER — Other Ambulatory Visit: Payer: Self-pay | Admitting: Family Medicine

## 2018-02-15 ENCOUNTER — Ambulatory Visit: Payer: BC Managed Care – PPO | Admitting: Family Medicine

## 2018-02-15 ENCOUNTER — Encounter: Payer: Self-pay | Admitting: Family Medicine

## 2018-02-15 VITALS — BP 120/80 | HR 72 | Ht 59.0 in | Wt 152.0 lb

## 2018-02-15 DIAGNOSIS — J301 Allergic rhinitis due to pollen: Secondary | ICD-10-CM | POA: Diagnosis not present

## 2018-02-15 DIAGNOSIS — K5900 Constipation, unspecified: Secondary | ICD-10-CM

## 2018-02-15 DIAGNOSIS — L259 Unspecified contact dermatitis, unspecified cause: Secondary | ICD-10-CM | POA: Insufficient documentation

## 2018-02-15 DIAGNOSIS — K644 Residual hemorrhoidal skin tags: Secondary | ICD-10-CM | POA: Diagnosis not present

## 2018-02-15 DIAGNOSIS — K219 Gastro-esophageal reflux disease without esophagitis: Secondary | ICD-10-CM | POA: Diagnosis not present

## 2018-02-15 MED ORDER — SENNOSIDES-DOCUSATE SODIUM 8.6-50 MG PO TABS: 1.0000 | ORAL_TABLET | Freq: Every day | ORAL | 3 refills | Status: DC

## 2018-02-15 MED ORDER — OMEPRAZOLE 20 MG PO CPDR: DELAYED_RELEASE_CAPSULE | ORAL | 3 refills | Status: DC

## 2018-02-15 MED ORDER — MONTELUKAST SODIUM 10 MG PO TABS: 10.0000 mg | ORAL_TABLET | Freq: Every day | ORAL | 3 refills | Status: DC

## 2018-02-15 NOTE — Progress Notes (Signed)
Name: Jennifer Kirby   MRN: [id number]    DOB: 24-Sep-1956   Date:02/15/2018       Progress Note  Subjective  Chief Complaint  Chief Complaint  Patient presents with  . Gastroesophageal Reflux  . Asthma  . Constipation    senna-cot prescription    Gastroesophageal Reflux  She reports no abdominal pain, no belching, no chest pain, no choking, no coughing, no dysphagia, no early satiety, no globus sensation, no heartburn, no nausea, no sore throat, no stridor or no wheezing. This is a chronic problem. The current episode started more than 1 year ago. The problem has been gradually improving. The symptoms are aggravated by certain foods. Pertinent negatives include no anemia, fatigue, melena, muscle weakness or weight loss. There are no known risk factors. She has tried a PPI for the symptoms. The treatment provided moderate relief. Past procedures do not include an abdominal ultrasound, an EGD, esophageal manometry, esophageal pH monitoring, H. pylori antibody titer or a UGI.  Constipation  This is a chronic problem. The current episode started more than 1 year ago. The problem has been waxing and waning since onset. Her stool frequency is 1 time per day. The patient is not on a high fiber diet. She exercises regularly. There has been adequate water intake. Pertinent negatives include no abdominal pain, anorexia, back pain, bloating, diarrhea, difficulty urinating, fecal incontinence, fever, flatus, hematochezia, hemorrhoids, melena, nausea, rectal pain, vomiting or weight loss. Risk factors include change in medication usage/dosage. She has tried fiber (sennakot) for the symptoms. The treatment provided mild relief. There is no history of abdominal surgery, neurologic disease or neuromuscular disease.  URI   This is a chronic problem. The current episode started more than 1 year ago. The problem has been waxing and waning. The maximum temperature recorded prior to her arrival was more than  104 F. Associated symptoms include congestion, a plugged ear sensation and rhinorrhea. Pertinent negatives include no abdominal pain, chest pain, coughing, diarrhea, dysuria, ear pain, headaches, joint pain, joint swelling, nausea, neck pain, rash, sinus pain, sneezing, sore throat, swollen glands, vomiting or wheezing. Treatments tried: singulair. The treatment provided mild relief.    No problem-specific Assessment & Plan notes found for this encounter.   Past Medical History:  Diagnosis Date  . Hyperlipidemia     Past Surgical History:  Procedure Laterality Date  . FOOT SURGERY Left    plantar fasciatis and broken foot    Family History  Problem Relation Age of Onset  . Throat cancer Father   . Breast cancer Neg Hx     Social History   Socioeconomic History  . Marital status: Widowed    Spouse name: Not on file  . Number of children: Not on file  . Years of education: Not on file  . Highest education level: Not on file  Occupational History  . Not on file  Social Needs  . Financial resource strain: Not on file  . Food insecurity:    Worry: Not on file    Inability: Not on file  . Transportation needs:    Medical: Not on file    Non-medical: Not on file  Tobacco Use  . Smoking status: Never Smoker  . Smokeless tobacco: Never Used  Substance and Sexual Activity  . Alcohol use: Yes    Alcohol/week: 0.0 oz  . Drug use: No  . Sexual activity: Never  Lifestyle  . Physical activity:    Days per week: Not on file  Minutes per session: Not on file  . Stress: Not on file  Relationships  . Social connections:    Talks on phone: Not on file    Gets together: Not on file    Attends religious service: Not on file    Active member of club or organization: Not on file    Attends meetings of clubs or organizations: Not on file    Relationship status: Not on file  . Intimate partner violence:    Fear of current or ex partner: Not on file    Emotionally abused: Not  on file    Physically abused: Not on file    Forced sexual activity: Not on file  Other Topics Concern  . Not on file  Social History Narrative  . Not on file    Allergies  Allergen Reactions  . Statins   . Zetia [Ezetimibe] Other (See Comments)    Gave leg cramps    Outpatient Medications Prior to Visit  Medication Sig Dispense Refill  . cyclobenzaprine (FLEXERIL) 10 MG tablet Take 1 tablet (10 mg total) by mouth at bedtime. 30 tablet 0  . montelukast (SINGULAIR) 10 MG tablet Take 1 tablet (10 mg total) by mouth at bedtime. 90 tablet 1  . omeprazole (PRILOSEC) 20 MG capsule TAKE 1 CAPSULE BY MOUTH EVERY DAY 30 capsule 0  . senna-docusate ([facility] SENNA PLUS) 8.6-50 MG tablet Take 1 tablet by mouth daily. 180 tablet 2  . guaiFENesin-codeine (ROBITUSSIN AC) 100-10 MG/5ML syrup Take 5 mLs by mouth 3 (three) times daily as needed for cough. 150 mL 0  . hydrocortisone (ANUSOL-HC) 2.5 % rectal cream Place 1 application rectally 2 (two) times daily. (Patient not taking: Reported on 07/06/2017) 30 g 1   No facility-administered medications prior to visit.     Review of Systems  Constitutional: Negative for chills, fatigue, fever, malaise/fatigue and weight loss.  HENT: Positive for congestion and rhinorrhea. Negative for ear discharge, ear pain, sinus pain, sneezing and sore throat.   Eyes: Negative for blurred vision.  Respiratory: Negative for cough, sputum production, choking, shortness of breath and wheezing.   Cardiovascular: Negative for chest pain, palpitations and leg swelling.  Gastrointestinal: Positive for constipation. Negative for abdominal pain, anorexia, bloating, blood in stool, diarrhea, dysphagia, flatus, heartburn, hematochezia, hemorrhoids, melena, nausea, rectal pain and vomiting.  Genitourinary: Negative for difficulty urinating, dysuria, frequency, hematuria and urgency.  Musculoskeletal: Negative for back pain, joint pain, myalgias, muscle weakness and neck pain.   Skin: Negative for rash.  Neurological: Negative for dizziness, tingling, sensory change, focal weakness and headaches.  Endo/Heme/Allergies: Negative for environmental allergies and polydipsia. Does not bruise/bleed easily.  Psychiatric/Behavioral: Negative for depression and suicidal ideas. The patient is not nervous/anxious and does not have insomnia.      Objective  Vitals:   02/15/18 1011  BP: 120/80  Pulse: 72  Weight: 152 lb (68.9 kg)  Height: 4\' 11"  (1.499 m)    Physical Exam  Constitutional: She is oriented to person, place, and time. She appears well-developed and well-nourished.  HENT:  Head: Normocephalic.  Right Ear: External ear normal.  Left Ear: External ear normal.  Mouth/Throat: Oropharynx is clear and moist.  Eyes: Pupils are equal, round, and reactive to light. Conjunctivae and EOM are normal. Lids are everted and swept, no foreign bodies found. Left eye exhibits no hordeolum. No foreign body present in the left eye. Right conjunctiva is not injected. Left conjunctiva is not injected. No scleral icterus.  Neck: Normal  range of motion. Neck supple. No JVD present. No tracheal deviation present. No thyromegaly present.  Cardiovascular: Normal rate, regular rhythm, normal heart sounds and intact distal pulses. Exam reveals no gallop and no friction rub.  No murmur heard. Pulmonary/Chest: Effort normal and breath sounds normal. No respiratory distress. She has no wheezes. She has no rales.  Abdominal: Soft. Bowel sounds are normal. She exhibits no mass. There is no hepatosplenomegaly. There is no tenderness. There is no rebound and no guarding.  Musculoskeletal: Normal range of motion. She exhibits no edema or tenderness.  Lymphadenopathy:    She has no cervical adenopathy.  Neurological: She is alert and oriented to person, place, and time. She has normal strength. She displays normal reflexes. No cranial nerve deficit.  Skin: Skin is warm. No rash noted.   Psychiatric: She has a normal mood and affect. Her mood appears not anxious. She does not exhibit a depressed mood.  Nursing note and vitals reviewed.     Assessment & Plan  Problem List Items Addressed This Visit    None    Visit Diagnoses    Gastroesophageal reflux disease without esophagitis    -  Primary   controlled on omeprazole   Relevant Medications   senna-docusate ([employer] SENNA PLUS) 8.6-50 MG tablet   omeprazole (PRILOSEC) 20 MG capsule   External hemorrhoids       stable/ controlled on senna   Relevant Medications   senna-docusate ([facility] SENNA PLUS) 8.6-50 MG tablet   Constipation, unspecified constipation type       stable /onseena   Relevant Medications   senna-docusate ([facility] SENNA PLUS) 8.6-50 MG tablet   Seasonal allergic rhinitis due to pollen       controlled on singular      Meds ordered this encounter  Medications  . senna-docusate ([employer] SENNA PLUS) 8.6-50 MG tablet    Sig: Take 1 tablet by mouth daily.    Dispense:  90 tablet    Refill:  3  . montelukast (SINGULAIR) 10 MG tablet    Sig: Take 1 tablet (10 mg total) by mouth at bedtime.    Dispense:  90 tablet    Refill:  3  . omeprazole (PRILOSEC) 20 MG capsule    Sig: TAKE 1 CAPSULE BY MOUTH EVERY DAY    Dispense:  90 capsule    Refill:  3    Needs appt      Dr. Otilio Miu [facility]  02/15/18

## 2018-03-16 ENCOUNTER — Encounter: Payer: Self-pay | Admitting: Family Medicine

## 2018-03-16 ENCOUNTER — Ambulatory Visit: Payer: BC Managed Care – PPO | Admitting: Family Medicine

## 2018-03-16 VITALS — BP 100/64 | HR 80 | Ht 59.0 in | Wt 147.0 lb

## 2018-03-16 DIAGNOSIS — L309 Dermatitis, unspecified: Secondary | ICD-10-CM | POA: Diagnosis not present

## 2018-03-16 DIAGNOSIS — B029 Zoster without complications: Secondary | ICD-10-CM | POA: Diagnosis not present

## 2018-03-16 MED ORDER — TRIAMCINOLONE ACETONIDE 0.1 % EX CREA: 1.0000 "application " | TOPICAL_CREAM | Freq: Two times a day (BID) | CUTANEOUS | 0 refills | Status: DC

## 2018-03-16 MED ORDER — VALACYCLOVIR HCL 1 G PO TABS: 1000.0000 mg | ORAL_TABLET | Freq: Two times a day (BID) | ORAL | 0 refills | Status: DC

## 2018-03-16 NOTE — Assessment & Plan Note (Signed)
Appearance simular to previous outbreak.  Will initiate vacycyclovir 1 gm bid for 10 days.

## 2018-03-16 NOTE — Progress Notes (Addendum)
Name: Jennifer Kirby   MRN: [id number]    DOB: [birth year]   Date:03/16/2018       Progress Note  Subjective  Chief Complaint  Chief Complaint  Patient presents with  . Rash    rash itches on R) side. Itches and has a wrap around pattern.    Rash  This is a new problem. The current episode started today. The problem has been gradually worsening since onset. The affected locations include the torso. The rash is characterized by itchiness. Pertinent negatives include no anorexia, congestion, cough, diarrhea, eye pain, facial edema, fatigue, fever, joint pain, nail changes, rhinorrhea, shortness of breath, sore throat or vomiting. Past treatments include nothing.    Herpes zoster Appearance simular to previous outbreak.  Will initiate vacycyclovir 1 gm bid for 10 days.   Past Medical History:  Diagnosis Date  . Hyperlipidemia     Past Surgical History:  Procedure Laterality Date  . FOOT SURGERY Left    plantar fasciatis and broken foot    Family History  Problem Relation Age of Onset  . Throat cancer Father   . Breast cancer Neg Hx     Social History   Socioeconomic History  . Marital status: Widowed    Spouse name: Not on file  . Number of children: Not on file  . Years of education: Not on file  . Highest education level: Not on file  Occupational History  . Not on file  Social Needs  . Financial resource strain: Not on file  . Food insecurity:    Worry: Not on file    Inability: Not on file  . Transportation needs:    Medical: Not on file    Non-medical: Not on file  Tobacco Use  . Smoking status: Never Smoker  . Smokeless tobacco: Never Used  Substance and Sexual Activity  . Alcohol use: Yes    Alcohol/week: 0.0 oz  . Drug use: No  . Sexual activity: Never  Lifestyle  . Physical activity:    Days per week: Not on file    Minutes per session: Not on file  . Stress: Not on file  Relationships  . Social connections:    Talks on phone: Not on  file    Gets together: Not on file    Attends religious service: Not on file    Active member of club or organization: Not on file    Attends meetings of clubs or organizations: Not on file    Relationship status: Not on file  . Intimate partner violence:    Fear of current or ex partner: Not on file    Emotionally abused: Not on file    Physically abused: Not on file    Forced sexual activity: Not on file  Other Topics Concern  . Not on file  Social History Narrative  . Not on file    Allergies  Allergen Reactions  . Statins   . Zetia [Ezetimibe] Other (See Comments)    Gave leg cramps    Outpatient Medications Prior to Visit  Medication Sig Dispense Refill  . montelukast (SINGULAIR) 10 MG tablet Take 1 tablet (10 mg total) by mouth at bedtime. 90 tablet 3  . omeprazole (PRILOSEC) 20 MG capsule TAKE 1 CAPSULE BY MOUTH EVERY DAY 90 capsule 3  . senna-docusate ([employer] SENNA PLUS) 8.6-50 MG tablet Take 1 tablet by mouth daily. 90 tablet 3   No facility-administered medications prior to visit.     Review  of Systems  Constitutional: Negative for chills, fatigue, fever, malaise/fatigue and weight loss.  HENT: Negative for congestion, ear discharge, ear pain, rhinorrhea and sore throat.   Eyes: Negative for blurred vision and pain.  Respiratory: Negative for cough, sputum production, shortness of breath and wheezing.   Cardiovascular: Negative for chest pain, palpitations and leg swelling.  Gastrointestinal: Negative for abdominal pain, anorexia, blood in stool, constipation, diarrhea, heartburn, melena, nausea and vomiting.  Genitourinary: Negative for dysuria, frequency, hematuria and urgency.  Musculoskeletal: Negative for back pain, joint pain, myalgias and neck pain.  Skin: Positive for rash. Negative for nail changes.  Neurological: Negative for dizziness, tingling, sensory change, focal weakness and headaches.  Endo/Heme/Allergies: Negative for environmental allergies and  polydipsia. Does not bruise/bleed easily.  Psychiatric/Behavioral: Negative for depression and suicidal ideas. The patient is not nervous/anxious and does not have insomnia.      Objective  Vitals:   03/16/18 1120  BP: 100/64  Pulse: 80  Weight: 147 lb (66.7 kg)  Height: 4\' 11"  (1.499 m)    Physical Exam  Constitutional: No distress.  HENT:  Head: Normocephalic and atraumatic.  Right Ear: External ear normal.  Left Ear: External ear normal.  Nose: Nose normal.  Mouth/Throat: Oropharynx is clear and moist.  Eyes: Pupils are equal, round, and reactive to light. Conjunctivae and EOM are normal. Right eye exhibits no discharge. Left eye exhibits no discharge.  Neck: Normal range of motion. Neck supple. No JVD present. No thyromegaly present.  Cardiovascular: Normal rate, regular rhythm, normal heart sounds and intact distal pulses. Exam reveals no gallop and no friction rub.  No murmur heard. Pulmonary/Chest: Effort normal and breath sounds normal.  Abdominal: Soft. Bowel sounds are normal. She exhibits no mass. There is no tenderness. There is no guarding.  Musculoskeletal: Normal range of motion. She exhibits no edema.  Lymphadenopathy:    She has no cervical adenopathy.  Neurological: She is alert. She has normal reflexes.  Skin: Skin is warm and dry. She is not diaphoretic. There is erythema.  Vesicles in group with erythematous base  Nursing note and vitals reviewed.     Assessment & Plan  Problem List Items Addressed This Visit      Other   Herpes zoster - Primary    Appearance simular to previous outbreak.  Will initiate vacycyclovir 1 gm bid for 10 days.      Relevant Medications   valACYclovir (VALTREX) 1000 MG tablet    Other Visit Diagnoses    Dermatitis       Mutiple insect bites noted. Will use triamcinolone for symptom relief.   Relevant Medications   triamcinolone cream (KENALOG) 0.1 %      Meds ordered this encounter  Medications  .  triamcinolone cream (KENALOG) 0.1 %    Sig: Apply 1 application topically 2 (two) times daily.    Dispense:  30 g    Refill:  0  . valACYclovir (VALTREX) 1000 MG tablet    Sig: Take 1 tablet (1,000 mg total) by mouth 2 (two) times daily.    Dispense:  20 tablet    Refill:  0      Dr. Deanna Jones [facility]  03/16/18

## 2018-05-12 ENCOUNTER — Emergency Department
Admission: EM | Admit: 2018-05-12 | Discharge: 2018-05-12 | Disposition: A | Discharge status: Home / Self Care | Payer: BC Managed Care – PPO | Attending: Emergency Medicine | Admitting: Emergency Medicine

## 2018-05-12 ENCOUNTER — Emergency Department: Payer: BC Managed Care – PPO

## 2018-05-12 ENCOUNTER — Other Ambulatory Visit: Payer: Self-pay

## 2018-05-12 DIAGNOSIS — Y929 Unspecified place or not applicable: Secondary | ICD-10-CM | POA: Insufficient documentation

## 2018-05-12 DIAGNOSIS — W293XXA Contact with powered garden and outdoor hand tools and machinery, initial encounter: Secondary | ICD-10-CM | POA: Diagnosis not present

## 2018-05-12 DIAGNOSIS — Y999 Unspecified external cause status: Secondary | ICD-10-CM | POA: Insufficient documentation

## 2018-05-12 DIAGNOSIS — S6982XA Other specified injuries of left wrist, hand and finger(s), initial encounter: Secondary | ICD-10-CM | POA: Diagnosis present

## 2018-05-12 DIAGNOSIS — S61213A Laceration without foreign body of left middle finger without damage to nail, initial encounter: Secondary | ICD-10-CM | POA: Insufficient documentation

## 2018-05-12 DIAGNOSIS — T07XXXA Unspecified multiple injuries, initial encounter: Secondary | ICD-10-CM

## 2018-05-12 DIAGNOSIS — Z79899 Other long term (current) drug therapy: Secondary | ICD-10-CM | POA: Diagnosis not present

## 2018-05-12 DIAGNOSIS — Y939 Activity, unspecified: Secondary | ICD-10-CM | POA: Diagnosis not present

## 2018-05-12 DIAGNOSIS — S61211A Laceration without foreign body of left index finger without damage to nail, initial encounter: Secondary | ICD-10-CM | POA: Diagnosis not present

## 2018-05-12 DIAGNOSIS — S61215A Laceration without foreign body of left ring finger without damage to nail, initial encounter: Secondary | ICD-10-CM | POA: Diagnosis not present

## 2018-05-12 MED ORDER — TRAMADOL HCL 50 MG PO TABS: 50.0000 mg | ORAL_TABLET | Freq: Four times a day (QID) | ORAL | 0 refills | Status: DC | PRN

## 2018-05-12 MED ORDER — CEPHALEXIN 500 MG PO CAPS: 500.0000 mg | ORAL_CAPSULE | Freq: Three times a day (TID) | ORAL | 0 refills | Status: DC

## 2018-05-12 MED ORDER — LIDOCAINE HCL (PF) 1 % IJ SOLN
10.0000 mL | Freq: Once | INTRAMUSCULAR | Status: AC
  Administered 2018-05-12: 10 mL via INTRADERMAL
  Filled 2018-05-12: qty 10

## 2018-05-12 MED ORDER — BACITRACIN-NEOMYCIN-POLYMYXIN 400-5-5000 EX OINT
TOPICAL_OINTMENT | CUTANEOUS | Status: AC
  Administered 2018-05-12: 20:00:00
  Filled 2018-05-12: qty 1

## 2018-05-12 MED ORDER — FLUCONAZOLE 150 MG PO TABS: ORAL_TABLET | ORAL | 0 refills | Status: DC

## 2018-05-12 MED ORDER — CEFAZOLIN SODIUM-DEXTROSE 1-4 GM/50ML-% IV SOLN
1.0000 g | Freq: Once | INTRAVENOUS | Status: AC
  Administered 2018-05-12: 1 g via INTRAVENOUS
  Filled 2018-05-12: qty 50

## 2018-05-12 NOTE — Discharge Instructions (Addendum)
Follow-up with Dr. Rudene Christians.  Please call him for an appointment.  You may take the splint off tomorrow.  Wash the area with soap and water daily.  Do not soak your hands down in water for any extended period of time.  You may get them wet while you shower.  Take the antibiotic as prescribed.  You have also been given pain medication if needed.  Return to the emergency department if you are having increased pain, swelling, pus or redness.

## 2018-05-12 NOTE — ED Provider Notes (Signed)
[facility] Emergency Department Provider Note  ____________________________________________   First MD Initiated Contact with Patient 05/12/18 1701     (approximate)  I have reviewed the triage vital signs and the nursing notes.   HISTORY  Chief Complaint Laceration    HPI Jennifer Kirby is a 62 y.o. female presents emergency department complaining of a laceration to the left hand.  She was using hedge clippers and cut the second, third, and fourth digits on the left hand.  She states she has some numbness distally.  She is unsure of foreign body.  Her last tetanus was within the past year.  She denies any other injuries at this time    Past Medical History:  Diagnosis Date  . Hyperlipidemia     Patient Active Problem List   Diagnosis Date Noted  . Contact dermatitis 02/15/2018  . Mass of skin of right shoulder 07/27/2016  . Brachial neuritis 03/08/2015  . Encounter for general adult medical examination without abnormal findings 03/08/2015  . Herpes zoster 03/08/2015  . H/O hypercholesterolemia 03/08/2015  . HLD (hyperlipidemia) 03/08/2015    Past Surgical History:  Procedure Laterality Date  . FOOT SURGERY Left    plantar fasciatis and broken foot    Prior to Admission medications   Medication Sig Start Date End Date Taking? Authorizing Provider  cephALEXin (KEFLEX) 500 MG capsule Take 1 capsule (500 mg total) by mouth 3 (three) times daily. 05/12/18   Fisher, Linden Dolin, PA-C  fluconazole (DIFLUCAN) 150 MG tablet Take one now and one in a week 05/12/18   Caryn Section, Linden Dolin, PA-C  montelukast (SINGULAIR) 10 MG tablet Take 1 tablet (10 mg total) by mouth at bedtime. 02/15/18   Juline Patch, MD  omeprazole (PRILOSEC) 20 MG capsule TAKE 1 CAPSULE BY MOUTH EVERY DAY 02/15/18   Juline Patch, MD  senna-docusate ([employer] SENNA PLUS) 8.6-50 MG tablet Take 1 tablet by mouth daily. 02/15/18   Juline Patch, MD  traMADol (ULTRAM) 50 MG tablet Take 1  tablet (50 mg total) by mouth every 6 (six) hours as needed. 05/12/18   Fisher, Linden Dolin, PA-C  triamcinolone cream (KENALOG) 0.1 % Apply 1 application topically 2 (two) times daily. 03/16/18   Juline Patch, MD  valACYclovir (VALTREX) 1000 MG tablet Take 1 tablet (1,000 mg total) by mouth 2 (two) times daily. 03/16/18   Juline Patch, MD    Allergies Statins and Zetia [ezetimibe]  Family History  Problem Relation Age of Onset  . Throat cancer Father   . Breast cancer Neg Hx     Social History Social History   Tobacco Use  . Smoking status: Never Smoker  . Smokeless tobacco: Never Used  Substance Use Topics  . Alcohol use: Yes    Alcohol/week: 0.0 standard drinks  . Drug use: No    Review of Systems  Constitutional: No fever/chills Eyes: No visual changes. ENT: No sore throat. Respiratory: Denies cough Genitourinary: Negative for dysuria. Musculoskeletal: Negative for back pain.  Positive laceration to the left second third and fourth fingers Skin: Negative for rash.  Positive laceration    ____________________________________________   PHYSICAL EXAM:  VITAL SIGNS: ED Triage Vitals  Enc Vitals Group     BP 05/12/18 1658 (!) 154/89     Pulse Rate 05/12/18 1658 96     Resp 05/12/18 1658 18     Temp 05/12/18 1658 99.2 F (37.3 C)     Temp Source 05/12/18 1658  Oral     SpO2 05/12/18 1658 98 %     Weight 05/12/18 1700 145 lb (65.8 kg)     Height 05/12/18 1700 4\' 11"  (1.499 m)     Head Circumference --      Peak Flow --      Pain Score 05/12/18 1659 8     Pain Loc --      Pain Edu? --      Excl. in [place]? --     Constitutional: Alert and oriented. Well appearing and in no acute distress. Eyes: Conjunctivae are normal.  Head: Atraumatic. Nose: No congestion/rhinnorhea. Mouth/Throat: Mucous membranes are moist.   Neck:  supple no lymphadenopathy noted Cardiovascular: Normal rate, regular rhythm.  Respiratory: Normal respiratory effort.  No retractions, GU:  deferred Musculoskeletal: FROM all extremities, warm and well perfused, positive lacerations on the palmer side of the left second third and fourth fingers.  The third finger has the deepest laceration.  She is able to move her fingers without difficulty.  Neurovascular is grossly intact. Neurologic:  Normal speech and language.  Skin:  Skin is warm, dry, positive for lacerations as described above  psychiatric: Mood and affect are normal. Speech and behavior are normal.  ____________________________________________   LABS (all labs ordered are listed, but only abnormal results are displayed)  Labs Reviewed - No data to display ____________________________________________   ____________________________________________  RADIOLOGY  X-ray of the left hand shows a fracture of the distal phalanx on the third finger  ____________________________________________   PROCEDURES  Procedure(s) performed:   Marland KitchenMarland KitchenLaceration Repair Date/Time: 05/12/2018 6:46 PM Performed by: Versie Starks, PA-C Authorized by: Versie Starks, PA-C   Consent:    Consent obtained:  Verbal   Consent given by:  Patient   Risks discussed:  Infection, pain and poor wound healing   Alternatives discussed:  Delayed treatment Anesthesia (see MAR for exact dosages):    Anesthesia method:  Nerve block   Block needle gauge:  25 G   Block anesthetic:  Lidocaine 1% w/o epi   Block injection procedure:  Anatomic landmarks identified, introduced needle and incremental injection   Block outcome:  Anesthesia achieved Laceration details:    Location:  Finger   Finger location:  L index finger   Length (cm):  2   Depth (mm):  3 Repair type:    Repair type:  Intermediate Pre-procedure details:    Preparation:  Patient was prepped and draped in usual sterile fashion Exploration:    Hemostasis achieved with:  Direct pressure   Wound exploration: wound explored through full range of motion     Wound extent: no foreign  bodies/material noted, no muscle damage noted and no underlying fracture noted     Contaminated: yes   Treatment:    Area cleansed with:  Betadine and saline   Amount of cleaning:  Extensive   Irrigation solution:  Sterile saline   Irrigation method:  Pressure wash, syringe and tap   Visualized foreign bodies/material removed: no   Skin repair:    Repair method:  Sutures   Suture size:  5-0   Suture material:  Nylon   Suture technique:  Simple interrupted   Number of sutures:  7 Approximation:    Approximation:  Close Post-procedure details:    Dressing:  Antibiotic ointment, non-adherent dressing and splint for protection   Patient tolerance of procedure:  Tolerated well, no immediate complications .Marland KitchenLaceration Repair Date/Time: 05/12/2018 6:48 PM Performed by: Versie Starks, PA-C  Authorized by: Versie Starks, PA-C   Consent:    Consent obtained:  Verbal   Consent given by:  Patient   Risks discussed:  Infection, pain, poor wound healing and retained foreign body   Alternatives discussed:  Delayed treatment Anesthesia (see MAR for exact dosages):    Anesthesia method:  Nerve block   Block needle gauge:  25 G   Block anesthetic:  Lidocaine 1% w/o epi   Block injection procedure:  Anatomic landmarks identified, introduced needle and incremental injection   Block outcome:  Anesthesia achieved Laceration details:    Location:  Finger   Finger location:  L long finger   Length (cm):  3   Depth (mm):  3 Repair type:    Repair type:  Intermediate Pre-procedure details:    Preparation:  Patient was prepped and draped in usual sterile fashion Exploration:    Hemostasis achieved with:  Direct pressure   Wound extent: underlying fracture   Treatment:    Area cleansed with:  Betadine and saline   Amount of cleaning:  Extensive   Irrigation solution:  Sterile saline   Irrigation method:  Pressure wash, syringe and tap   Visualized foreign bodies/material removed: no   Skin  repair:    Repair method:  Sutures   Suture size:  5-0   Suture material:  Nylon   Suture technique:  Simple interrupted   Number of sutures:  8 Approximation:    Approximation:  Close Post-procedure details:    Dressing:  Antibiotic ointment, non-adherent dressing and splint for protection   Patient tolerance of procedure:  Tolerated well, no immediate complications .Marland KitchenLaceration Repair Date/Time: 05/12/2018 6:49 PM Performed by: Versie Starks, PA-C Authorized by: Versie Starks, PA-C   Consent:    Consent obtained:  Verbal   Consent given by:  Patient   Risks discussed:  Infection, pain, poor cosmetic result, retained foreign body and poor wound healing   Alternatives discussed:  Delayed treatment Anesthesia (see MAR for exact dosages):    Anesthesia method:  Nerve block   Block needle gauge:  25 G   Block anesthetic:  Lidocaine 1% w/o epi   Block injection procedure:  Anatomic landmarks identified, introduced needle, incremental injection and anatomic landmarks palpated   Block outcome:  Anesthesia achieved Laceration details:    Location:  Finger   Finger location:  L ring finger   Length (cm):  2   Depth (mm):  2 Repair type:    Repair type:  Simple Pre-procedure details:    Preparation:  Patient was prepped and draped in usual sterile fashion Exploration:    Wound extent: no foreign bodies/material noted, no tendon damage noted and no underlying fracture noted   Treatment:    Area cleansed with:  Betadine and saline   Amount of cleaning:  Standard   Irrigation solution:  Sterile saline   Irrigation method:  Pressure wash, syringe and tap   Visualized foreign bodies/material removed: no   Skin repair:    Repair method:  Sutures   Suture size:  5-0   Suture material:  Nylon   Suture technique:  Simple interrupted   Number of sutures:  4 Approximation:    Approximation:  Close Post-procedure details:    Dressing:  Antibiotic ointment and non-adherent  dressing   Patient tolerance of procedure:  Tolerated well, no immediate complications      ____________________________________________   INITIAL IMPRESSION / ASSESSMENT AND PLAN / ED COURSE  Pertinent labs &  imaging results that were available during my care of the patient were reviewed by me and considered in my medical decision making (see chart for details).   Patient 62 year old female presents emergency department complaining of lacerations to the left index middle and fourth finger.  She states she cut them with a hedge trimmer.  Her tetanus is up-to-date.  On physical exam the lacerations are across the palmar surface of the hand.  The left second third and fourth fingers are affected.  The laceration is jagged.  No foreign body was noted.  No tendon involvement is noted.  X-ray shows a fracture of the left third finger distal phalanx.  Explained the results to the patient.  The lacerations were repaired with 5-0 Ethilon.  See procedure note.  Patient was given Ancef 1 g IV due to the open fracture.  She is to follow-up with Dr. Rudene Christians by calling and make an appointment.  She is established with him.  She is to change the dressing tomorrow.  She was given a splint for protection for tonight.  Tomorrow she may switch over to a finger splint.  She states she understands all of the instructions.  She was discharged in stable condition in the care of her sons.     As part of my medical decision making, I reviewed the following data within the [place] notes reviewed and incorporated, Old chart reviewed, Radiograph reviewed x-ray of the left hand shows a distal phalanx fracture, Notes from prior ED visits and [place] Controlled Substance Database  ____________________________________________   FINAL CLINICAL IMPRESSION(S) / ED DIAGNOSES  Final diagnoses:  Multiple lacerations      NEW MEDICATIONS STARTED DURING THIS VISIT:  New Prescriptions    CEPHALEXIN (KEFLEX) 500 MG CAPSULE    Take 1 capsule (500 mg total) by mouth 3 (three) times daily.   FLUCONAZOLE (DIFLUCAN) 150 MG TABLET    Take one now and one in a week   TRAMADOL (ULTRAM) 50 MG TABLET    Take 1 tablet (50 mg total) by mouth every 6 (six) hours as needed.     Note:  This document was prepared using Dragon voice recognition software and may include unintentional dictation errors.    Versie Starks, PA-C 05/12/18 Shanda Bumps, MD 05/12/18 2340

## 2018-05-12 NOTE — ED Triage Notes (Signed)
Pt to the er for lacerations to the 2nd, 3rd and 4th digits on the left hand vs hedge clippers.

## 2018-11-25 ENCOUNTER — Ambulatory Visit: Payer: BC Managed Care – PPO | Admitting: Obstetrics & Gynecology

## 2018-11-25 ENCOUNTER — Encounter: Payer: Self-pay | Admitting: Obstetrics & Gynecology

## 2018-11-25 VITALS — BP 140/90 | Ht 59.0 in | Wt 155.0 lb

## 2018-11-25 DIAGNOSIS — Z6831 Body mass index (BMI) 31.0-31.9, adult: Secondary | ICD-10-CM

## 2018-11-25 DIAGNOSIS — E669 Obesity, unspecified: Secondary | ICD-10-CM

## 2018-11-25 MED ORDER — PHENTERMINE HCL 37.5 MG PO TABS: ORAL_TABLET | ORAL | 0 refills | Status: DC

## 2018-11-25 NOTE — Progress Notes (Signed)
HPI:  Jennifer Kirby is a 63 y.o. [id number] presenting for evaluation of abnormal weight gain.  The Jennifer Kirby has gained 20 pounds over the past year..  She feels she has gained weight due to problems with her physical activity. She had a hand accident last fall and has had limited activity related to that still.  She has these associated symptoms: none.  She has tried prescription appetite suppressants: Phentermine in the past with some success. PMHx: She  has a past medical history of Hyperlipidemia. Also,  has a past surgical history that includes Foot surgery (Left)., family history includes Throat cancer in her father.,  reports that she has never smoked. She has never used smokeless tobacco. She reports current alcohol use. She reports that she does not use drugs.  She has a current medication list which includes the following prescription(s): omeprazole, cephalexin, fluconazole, montelukast, phentermine, senna-docusate, tramadol, triamcinolone cream, and valacyclovir. Also, is allergic to statins and zetia [ezetimibe].  Review of Systems  Constitutional: Negative for chills, fever and malaise/fatigue.  HENT: Negative for congestion, sinus pain and sore throat.   Eyes: Negative for blurred vision and pain.  Respiratory: Negative for cough and wheezing.   Cardiovascular: Negative for chest pain and leg swelling.  Gastrointestinal: Negative for abdominal pain, constipation, diarrhea, heartburn, nausea and vomiting.  Genitourinary: Negative for dysuria, frequency, hematuria and urgency.  Musculoskeletal: Negative for back pain, joint pain, myalgias and neck pain.  Skin: Negative for itching and rash.  Neurological: Negative for dizziness, tremors and weakness.  Endo/Heme/Allergies: Does not bruise/bleed easily.  Psychiatric/Behavioral: Negative for depression. The Jennifer Kirby is not nervous/anxious and does not have insomnia.     Objective: BP 140/90   Ht 4\' 11"  (1.499 m)   Wt 155 lb (70.3 kg)   BMI  31.31 kg/m  Physical Exam Constitutional:      General: She is not in acute distress.    Appearance: She is well-developed.  Neck:     Musculoskeletal: Normal range of motion.     Thyroid: No thyroid mass.  Cardiovascular:     Rate and Rhythm: Normal rate and regular rhythm.     Heart sounds: Normal heart sounds. No murmur. No friction rub. No gallop.   Pulmonary:     Effort: Pulmonary effort is normal.     Breath sounds: Normal breath sounds and air entry.  Musculoskeletal: Normal range of motion.  Neurological:     General: No focal deficit present.     Mental Status: She is alert and oriented to person, place, and time.     Coordination: Coordination is intact.     Gait: Gait is intact.  Skin:    General: Skin is warm and dry.  Psychiatric:        Attention and Perception: Attention and perception normal.        Mood and Affect: Mood and affect normal.        Speech: Speech normal.        Behavior: Behavior normal.        Thought Content: Thought content normal.        Cognition and Memory: Cognition normal.        Judgment: Judgment normal.  Vitals signs reviewed.   ASSESSMENT: 1. Obesity (BMI 30-39.9)  Plan: Will assist Jennifer Kirby in incorporating positive experiences into her life to promote a positive mental attitude.  Education given regarding appropriate lifestyle changes for weight loss, including regular physical activity, healthy coping strategies, caloric restriction, and healthy eating patterns.  Jennifer Kirby will be started on prescription appetite suppressants: Phentermine.  Also B12.    The risks and benefits as well as side effects of medication, such as Phenteramine or Tenuate, is discussed.  The pros and cons of suppressing appetite and boosting metabolism is counseled.  Risks of tolerance and addiction discussed.  Use of medicine will be short term.  Pt to call with any negative side effects and agrees to keep follow up appointments.  Barnett Applebaum, MD,  [facility], [facility] 11/25/2018  9:39 AM

## 2018-11-25 NOTE — Patient Instructions (Signed)
Phentermine tablets or capsules Also take Vitamin B12  What is this medicine? PHENTERMINE (FEN ter meen) decreases your appetite. It is used with a reduced calorie diet and exercise to help you lose weight. This medicine may be used for other purposes; ask your health care provider or pharmacist if you have questions. COMMON BRAND NAME(S): Adipex-P, Atti-Plex P, Atti-Plex P Spansule, Fastin, Lomaira, Pro-Fast, Tara-8 What should I tell my health care provider before I take this medicine? They need to know if you have any of these conditions: -agitation or nervousness -diabetes -glaucoma -heart disease -high blood pressure -history of drug abuse or addiction -history of stroke -kidney disease -lung disease called Primary Pulmonary Hypertension (PPH) -taken an MAOI like Carbex, Eldepryl, Marplan, Nardil, or Parnate in last 14 days -taking stimulant medicines for attention disorders, weight loss, or to stay awake -thyroid disease -an unusual or allergic reaction to phentermine, other medicines, foods, dyes, or preservatives -pregnant or trying to get pregnant -breast-feeding How should I use this medicine? Take this medicine by mouth with a glass of water. Follow the directions on the prescription label. The instructions for use may differ based on the product and dose you are taking. Avoid taking this medicine in the evening. It may interfere with sleep. Take your doses at regular intervals. Do not take your medicine more often than directed. Talk to your pediatrician regarding the use of this medicine in children. While this drug may be prescribed for children 17 years or older for selected conditions, precautions do apply. Overdosage: If you think you have taken too much of this medicine contact a poison control center or emergency room at once. NOTE: This medicine is only for you. Do not share this medicine with others. What if I miss a dose? If you miss a dose, take it as soon as you  can. If it is almost time for your next dose, take only that dose. Do not take double or extra doses. What may interact with this medicine? Do not take this medicine with any of the following medications: -MAOIs like Carbex, Eldepryl, Marplan, Nardil, and Parnate -medicines for colds or breathing difficulties like pseudoephedrine or phenylephrine -procarbazine -sibutramine -stimulant medicines for attention disorders, weight loss, or to stay awake This medicine may also interact with the following medications: -certain medicines for depression, anxiety, or psychotic disturbances -linezolid -medicines for diabetes -medicines for high blood pressure This list may not describe all possible interactions. Give your health care provider a list of all the medicines, herbs, non-prescription drugs, or dietary supplements you use. Also tell them if you smoke, drink alcohol, or use illegal drugs. Some items may interact with your medicine. What should I watch for while using this medicine? Notify your physician immediately if you become short of breath while doing your normal activities. Do not take this medicine within 6 hours of bedtime. It can keep you from getting to sleep. Avoid drinks that contain caffeine and try to stick to a regular bedtime every night. This medicine was intended to be used in addition to a healthy diet and exercise. The best results are achieved this way. This medicine is only indicated for short-term use. Eventually your weight loss may level out. At that point, the drug will only help you maintain your new weight. Do not increase or in any way change your dose without consulting your doctor. You may get drowsy or dizzy. Do not drive, use machinery, or do anything that needs mental alertness until you know how  this medicine affects you. Do not stand or sit up quickly, especially if you are an older patient. This reduces the risk of dizzy or fainting spells. Alcohol may increase  dizziness and drowsiness. Avoid alcoholic drinks. What side effects may I notice from receiving this medicine? Side effects that you should report to your doctor or health care professional as soon as possible: -allergic reactions like skin rash, itching or hives, swelling of the face, lips, or tongue) -anxiety -breathing problems -changes in vision -chest pain or chest tightness -depressed mood or other mood changes -hallucinations, loss of contact with reality -fast, irregular heartbeat -increased blood pressure -irritable -nervousness or restlessness -painful urination -palpitations -tremors -trouble sleeping -seizures -signs and symptoms of a stroke like changes in vision; confusion; trouble speaking or understanding; severe headaches; sudden numbness or weakness of the face, arm or leg; trouble walking; dizziness; loss of balance or coordination -unusually weak or tired -vomiting Side effects that usually do not require medical attention (report to your doctor or health care professional if they continue or are bothersome): -constipation or diarrhea -dry mouth -headache -nausea -stomach upset -sweating This list may not describe all possible side effects. Call your doctor for medical advice about side effects. You may report side effects to FDA at 1-800-FDA-1088. Where should I keep my medicine? Keep out of the reach of children. This medicine can be abused. Keep your medicine in a safe place to protect it from theft. Do not share this medicine with anyone. Selling or giving away this medicine is dangerous and against the law. This medicine may cause accidental overdose and death if taken by other adults, children, or pets. Mix any unused medicine with a substance like cat litter or coffee grounds. Then throw the medicine away in a sealed container like a sealed bag or a coffee can with a lid. Do not use the medicine after the expiration date. Store at room temperature between  20 and 25 degrees C (68 and 77 degrees F). Keep container tightly closed. NOTE: This sheet is a summary. It may not cover all possible information. If you have questions about this medicine, talk to your doctor, pharmacist, or health care provider.  2019 Elsevier/Gold Standard (2017-02-26 08:23:13)

## 2018-12-23 ENCOUNTER — Other Ambulatory Visit: Payer: Self-pay

## 2018-12-23 ENCOUNTER — Encounter: Payer: Self-pay | Admitting: Obstetrics & Gynecology

## 2018-12-23 ENCOUNTER — Ambulatory Visit (INDEPENDENT_AMBULATORY_CARE_PROVIDER_SITE_OTHER): Payer: BC Managed Care – PPO | Admitting: Obstetrics & Gynecology

## 2018-12-23 DIAGNOSIS — E669 Obesity, unspecified: Secondary | ICD-10-CM

## 2018-12-23 MED ORDER — PHENTERMINE HCL 37.5 MG PO TABS: ORAL_TABLET | ORAL | 1 refills | Status: DC

## 2018-12-23 NOTE — Progress Notes (Signed)
Virtual Visit via Telephone Note  I connected with [place] on 12/23/18 at  8:40 AM EDT by telephone and verified that I am speaking with the correct person using two identifiers.   I discussed the limitations, risks, security and privacy concerns of performing an evaluation and management service by telephone and the availability of in person appointments. I also discussed with the patient that there may be a patient responsible charge related to this service. The patient expressed understanding and agreed to proceed.  She was at home and I was in my office.  History of Present Illness: Jennifer Kirby is a 63 y.o. who was started on Phentermine approximately 1 month ago due to obesity/abnormal weight gain. The patient has lost 6 pounds over the past month due to lifestyle measures and meds..   She has these side effects: none.  PMHx: She  has a past medical history of Hyperlipidemia. Also,  has a past surgical history that includes Foot surgery (Left)., family history includes Throat cancer in her father.,  reports that she has never smoked. She has never used smokeless tobacco. She reports current alcohol use. She reports that she does not use drugs.  She has a current medication list which includes the following prescription(s): montelukast, omeprazole, phentermine, triamcinolone cream, and valacyclovir. Also, is allergic to statins and zetia [ezetimibe].  Review of Systems  All other systems reviewed and are negative.   Observations/Objective: No exam today, due to telephone eVisit due to [facility] virus restriction on elective visits and procedures.  Prior visits reviewed along with ultrasounds/labs as indicated. Reported weight 149 (BMI 31)    Loss of 6 lbs from prior visit here in Feb 2020  Assessment and Plan: 1. Obesity (BMI 30-39.9) Cont Phentermine, Rx today w refill  Will continue to assist patient in incorporating positive experiences into her life to promote  a positive mental attitude.  Education given regarding appropriate lifestyle changes for weight loss, including regular physical activity, healthy coping strategies, caloric restriction, and healthy eating patterns.  The risks and benefits as well as side effects of medication, such as Phenteramine or Tenuate, is discussed.  The pros and cons of suppressing appetite and boosting metabolism is counseled.  Risks of tolerance and addiction discussed.  Use of medicine will be short term.  Pt to call with any negative side effects and agrees to keep follow up appointments.   Follow Up Instructions: 2 months follow up   I discussed the assessment and treatment plan with the patient. The patient was provided an opportunity to ask questions and all were answered. The patient agreed with the plan and demonstrated an understanding of the instructions.   The patient was advised to call back or seek an in-person evaluation if the symptoms worsen or if the condition fails to improve as anticipated.  I provided 6 minutes of non-face-to-face time during this encounter.   Hoyt Koch, MD [facility], [facility] 12/23/2018  8:32 AM

## 2019-02-22 ENCOUNTER — Encounter: Payer: Self-pay | Admitting: Obstetrics & Gynecology

## 2019-02-22 ENCOUNTER — Other Ambulatory Visit: Payer: Self-pay

## 2019-02-22 ENCOUNTER — Ambulatory Visit (INDEPENDENT_AMBULATORY_CARE_PROVIDER_SITE_OTHER): Payer: BC Managed Care – PPO | Admitting: Obstetrics & Gynecology

## 2019-02-22 VITALS — Ht 59.0 in | Wt 140.0 lb

## 2019-02-22 DIAGNOSIS — E663 Overweight: Secondary | ICD-10-CM | POA: Diagnosis not present

## 2019-02-22 MED ORDER — PHENTERMINE HCL 37.5 MG PO TABS: ORAL_TABLET | ORAL | 1 refills | Status: DC

## 2019-02-22 NOTE — Progress Notes (Signed)
Virtual Visit via Telephone Note  I connected with [name] on 02/22/19 at  3:10 PM EDT by telephone and verified that I am speaking with the correct person using two identifiers.   I discussed the limitations, risks, security and privacy concerns of performing an evaluation and management service by telephone and the availability of in person appointments. I also discussed with the patient that there may be a patient responsible charge related to this service. The patient expressed understanding and agreed to proceed.  She was at home and I was in my office.  History of Present Illness:  Jennifer Kirby is a 63 y.o. who was started on Phentermine approximately 3 months ago due to obesity/abnormal weight gain. The patient has lost 9 pounds over the past 2 mos due to meds and lifestyle changes..   She has these side effects: none.  PMHx: She  has a past medical history of Hyperlipidemia. Also,  has a past surgical history that includes Foot surgery (Left)., family history includes Throat cancer in her father.,  reports that she has never smoked. She has never used smokeless tobacco. She reports current alcohol use. She reports that she does not use drugs.  She has a current medication list which includes the following prescription(s): montelukast, omeprazole, phentermine, triamcinolone cream, and valacyclovir. Also, is allergic to statins and zetia [ezetimibe].  Review of Systems  All other systems reviewed and are negative.   Observations/Objective: No exam today, due to telephone eVisit due to [facility] virus restriction on elective visits and procedures.  Prior visits reviewed along with ultrasounds/labs as indicated. Reported weight 140 (BMI 28)  Assessment and Plan: 1. Overweight (BMI 25.0-29.9) - continue medication for 2 mos then holiday - maintenance of weight with diet and exercise habits discussed thereafter - phentermine (ADIPEX-P) 37.5 MG tablet; One tablet po in  morning.  Dispense: 30 tablet; Refill: 1 - Will continue to assist patient in incorporating positive experiences into her life to promote a positive mental attitude.  Education given regarding appropriate lifestyle changes for weight loss, including regular physical activity, healthy coping strategies, caloric restriction, and healthy eating patterns. - The risks and benefits as well as side effects of medication, such as Phenteramine or Tenuate, is discussed.  The pros and cons of suppressing appetite and boosting metabolism is counseled.  Risks of tolerance and addiction discussed.  Use of medicine will be short term.  Pt to call with any negative side effects and agrees to keep follow up appointments.  Follow Up Instructions: As needed   I discussed the assessment and treatment plan with the patient. The patient was provided an opportunity to ask questions and all were answered. The patient agreed with the plan and demonstrated an understanding of the instructions.   The patient was advised to call back or seek an in-person evaluation if the symptoms worsen or if the condition fails to improve as anticipated.  I provided 6 minutes of non-face-to-face time during this encounter.   Hoyt Koch, MD

## 2019-03-09 ENCOUNTER — Other Ambulatory Visit: Payer: Self-pay | Admitting: Family Medicine

## 2019-03-09 DIAGNOSIS — K644 Residual hemorrhoidal skin tags: Secondary | ICD-10-CM

## 2019-03-09 DIAGNOSIS — K5900 Constipation, unspecified: Secondary | ICD-10-CM

## 2019-03-20 ENCOUNTER — Other Ambulatory Visit: Payer: Self-pay

## 2019-03-20 ENCOUNTER — Ambulatory Visit: Payer: BC Managed Care – PPO | Admitting: Family Medicine

## 2019-03-20 ENCOUNTER — Encounter: Payer: Self-pay | Admitting: Family Medicine

## 2019-03-20 VITALS — BP 110/64 | HR 64 | Ht 59.0 in | Wt 139.0 lb

## 2019-03-20 DIAGNOSIS — L309 Dermatitis, unspecified: Secondary | ICD-10-CM | POA: Diagnosis not present

## 2019-03-20 DIAGNOSIS — M75102 Unspecified rotator cuff tear or rupture of left shoulder, not specified as traumatic: Secondary | ICD-10-CM

## 2019-03-20 DIAGNOSIS — W19XXXA Unspecified fall, initial encounter: Secondary | ICD-10-CM | POA: Diagnosis not present

## 2019-03-20 DIAGNOSIS — J301 Allergic rhinitis due to pollen: Secondary | ICD-10-CM

## 2019-03-20 DIAGNOSIS — Z1239 Encounter for other screening for malignant neoplasm of breast: Secondary | ICD-10-CM

## 2019-03-20 DIAGNOSIS — M7542 Impingement syndrome of left shoulder: Secondary | ICD-10-CM

## 2019-03-20 DIAGNOSIS — K219 Gastro-esophageal reflux disease without esophagitis: Secondary | ICD-10-CM

## 2019-03-20 MED ORDER — PANTOPRAZOLE SODIUM 40 MG PO TBEC: 40.0000 mg | DELAYED_RELEASE_TABLET | Freq: Every day | ORAL | 3 refills | Status: DC

## 2019-03-20 MED ORDER — TRIAMCINOLONE ACETONIDE 0.1 % EX CREA: 1.0000 "application " | TOPICAL_CREAM | Freq: Two times a day (BID) | CUTANEOUS | 0 refills | Status: DC

## 2019-03-20 MED ORDER — MONTELUKAST SODIUM 10 MG PO TABS: 10.0000 mg | ORAL_TABLET | Freq: Every day | ORAL | 3 refills | Status: DC

## 2019-03-20 MED ORDER — SENNA PLUS 50-8.6 MG PO CAPS: 2.0000 | ORAL_CAPSULE | Freq: Every day | ORAL | 5 refills | Status: DC

## 2019-03-20 NOTE — Addendum Note (Signed)
Addended by: Fredderick Severance on: 03/20/2019 01:20 PM   Modules accepted: Orders

## 2019-03-20 NOTE — Progress Notes (Signed)
Date:  03/20/2019   Name:  Jennifer Kirby   DOB:  1956/05/24   MRN:  [id number]   Chief Complaint: Allergic Rhinitis , Gastroesophageal Reflux, and joint pain in arm and knees (wants meloxicam)  Gastroesophageal Reflux She reports no abdominal pain, no belching, no chest pain, no choking, no coughing, no dysphagia, no early satiety, no globus sensation, no heartburn, no hoarse voice, no nausea, no sore throat, no stridor, no tooth decay, no water brash or no wheezing. This is a chronic problem. The current episode started more than 1 year ago. The problem occurs frequently. The problem has been waxing and waning. The symptoms are aggravated by certain foods. Pertinent negatives include no anemia, fatigue, melena, muscle weakness, orthopnea or weight loss. Risk factors include caffeine use. She has tried a PPI (omeprazole 20) for the symptoms. The treatment provided moderate relief. Past procedures do not include an abdominal ultrasound, an EGD, esophageal manometry, esophageal pH monitoring, H. pylori antibody titer or a UGI.  URI  This is a chronic problem. The current episode started more than 1 year ago. The problem has been waxing and waning. There has been no fever. Associated symptoms include congestion and rhinorrhea. Pertinent negatives include no abdominal pain, chest pain, coughing, diarrhea, dysuria, ear pain, headaches, joint pain, joint swelling, nausea, plugged ear sensation, rash, sinus pain, sneezing, sore throat, vomiting or wheezing. Treatments tried: singulair. The treatment provided moderate relief.  Shoulder Pain  The pain is present in the left shoulder. This is a chronic (worse s/p fall) problem. The current episode started more than 1 year ago (worse past 7 days). There has been a history of trauma (fall). The problem occurs intermittently. The quality of the pain is described as aching. The pain is moderate. Associated symptoms include a limited range of motion.  Pertinent negatives include no fever, inability to bear weight, itching, joint locking, joint swelling, numbness, stiffness or tingling. The treatment provided moderate relief.  Fall The accident occurred more than 1 week ago (weeks ago). Fall occurred: backwards. The point of impact was the left shoulder. The pain is present in the left upper arm. The pain is at a severity of 5/10. The pain is moderate. Exacerbated by: abduction. Pertinent negatives include no abdominal pain, fever, headaches, hematuria, nausea, numbness, tingling or vomiting.    Review of Systems  Constitutional: Negative.  Negative for chills, fatigue, fever, unexpected weight change and weight loss.  HENT: Positive for congestion and rhinorrhea. Negative for ear discharge, ear pain, hoarse voice, sinus pressure, sinus pain, sneezing and sore throat.   Eyes: Negative for photophobia, pain, discharge, redness and itching.  Respiratory: Negative for cough, choking, shortness of breath, wheezing and stridor.   Cardiovascular: Negative for chest pain.  Gastrointestinal: Negative for abdominal pain, blood in stool, constipation, diarrhea, dysphagia, heartburn, melena, nausea and vomiting.  Endocrine: Negative for cold intolerance, heat intolerance, polydipsia, polyphagia and polyuria.  Genitourinary: Negative for dysuria, flank pain, frequency, hematuria, menstrual problem, pelvic pain, urgency, vaginal bleeding and vaginal discharge.  Musculoskeletal: Negative for arthralgias, back pain, joint pain, myalgias, muscle weakness and stiffness.  Skin: Negative for itching and rash.  Allergic/Immunologic: Negative for environmental allergies and food allergies.  Neurological: Negative for dizziness, tingling, weakness, light-headedness, numbness and headaches.  Hematological: Negative for adenopathy. Does not bruise/bleed easily.  Psychiatric/Behavioral: Negative for dysphoric mood. The patient is not nervous/anxious.     Patient  Active Problem List   Diagnosis Date Noted  . Obesity (BMI 30-39.9)  11/25/2018  . Contact dermatitis 02/15/2018  . Mass of skin of right shoulder 07/27/2016  . Brachial neuritis 03/08/2015  . Encounter for general adult medical examination without abnormal findings 03/08/2015  . Herpes zoster 03/08/2015  . H/O hypercholesterolemia 03/08/2015  . HLD (hyperlipidemia) 03/08/2015    Allergies  Allergen Reactions  . Statins   . Zetia [Ezetimibe] Other (See Comments)    Gave leg cramps    Past Surgical History:  Procedure Laterality Date  . FOOT SURGERY Left    plantar fasciatis and broken foot    Social History   Tobacco Use  . Smoking status: Never Smoker  . Smokeless tobacco: Never Used  Substance Use Topics  . Alcohol use: Yes    Alcohol/week: 0.0 standard drinks  . Drug use: No     Medication list has been reviewed and updated.  Current Meds  Medication Sig  . montelukast (SINGULAIR) 10 MG tablet Take 1 tablet (10 mg total) by mouth at bedtime.  Marland Kitchen omeprazole (PRILOSEC) 20 MG capsule TAKE 1 CAPSULE BY MOUTH EVERY DAY  . phentermine (ADIPEX-P) 37.5 MG tablet One tablet po in morning.  . triamcinolone cream (KENALOG) 0.1 % Apply 1 application topically 2 (two) times daily.    PHQ 2/9 Scores 07/06/2017 12/30/2015  PHQ - 2 Score 0 2  PHQ- 9 Score 0 -    BP Readings from Last 3 Encounters:  03/20/19 110/64  11/25/18 140/90  05/12/18 (!) 144/78    Physical Exam Vitals signs and nursing note reviewed.  Constitutional:      General: She is not in acute distress.    Appearance: She is not diaphoretic.  HENT:     Head: Normocephalic and atraumatic.     Jaw: There is normal jaw occlusion.     Right Ear: Hearing, tympanic membrane, ear canal and external ear normal.     Left Ear: Hearing, tympanic membrane, ear canal and external ear normal.     Nose: Nose normal.     Mouth/Throat:     Lips: Pink.     Mouth: Mucous membranes are moist.  Eyes:     General:         Right eye: No discharge.        Left eye: No discharge.     Conjunctiva/sclera: Conjunctivae normal.     Pupils: Pupils are equal, round, and reactive to light.  Neck:     Musculoskeletal: Normal range of motion and neck supple.     Thyroid: No thyromegaly.     Vascular: No JVD.  Cardiovascular:     Rate and Rhythm: Normal rate and regular rhythm.     Heart sounds: Normal heart sounds, S1 normal and S2 normal. No murmur. No systolic murmur. No diastolic murmur. No friction rub. No gallop. No S3 or S4 sounds.   Pulmonary:     Effort: Pulmonary effort is normal.     Breath sounds: Normal breath sounds.  Chest:     Chest wall: No mass.     Breasts:        Right: Normal. No swelling, bleeding, inverted nipple, mass, nipple discharge, skin change or tenderness.        Left: Normal. No swelling, bleeding, inverted nipple, mass, nipple discharge, skin change or tenderness.  Abdominal:     General: Bowel sounds are normal.     Palpations: Abdomen is soft. There is no hepatomegaly, splenomegaly or mass.     Tenderness: There is no abdominal tenderness. There  is no guarding.  Musculoskeletal:     Left shoulder: She exhibits decreased range of motion, tenderness and swelling. She exhibits no deformity and no spasm.       Arms:     Comments: Limited abduction/history of rotator cuff tear  Lymphadenopathy:     Cervical: No cervical adenopathy.     Upper Body:     Right upper body: No supraclavicular adenopathy.     Left upper body: No supraclavicular adenopathy.  Skin:    General: Skin is warm and dry.  Neurological:     Mental Status: She is alert.     Deep Tendon Reflexes: Reflexes are normal and symmetric.     Wt Readings from Last 3 Encounters:  03/20/19 139 lb (63 kg)  02/22/19 140 lb (63.5 kg)  11/25/18 155 lb (70.3 kg)    BP 110/64   Pulse 64   Ht 4\' 11"  (1.499 m)   Wt 139 lb (63 kg)   BMI 28.07 kg/m   Assessment and Plan:   1. Dermatitis Patient with history of  contact dermatitis for which he occasionally needs to use triamcinolone cream for early closure to the note. - triamcinolone cream (KENALOG) 0.1 %; Apply 1 application topically 2 (two) times daily.  Dispense: 30 g; Refill: 0  2. Seasonal allergic rhinitis due to pollen With history of seasonal allergic rhinitis.  Refill Singulair 10 mg once a day and may use antihistamines as needed - montelukast (SINGULAIR) 10 MG tablet; Take 1 tablet (10 mg total) by mouth at bedtime.  Dispense: 90 tablet; Refill: 3  3. Tear of left rotator cuff, unspecified tear extent, unspecified whether traumatic Patient had a previous history of a documented rotator cuff tear for which she did not proceed with surgery.  This is gradually been worsening over the time course and patient would like to have it evaluated at this time.  Referral was placed with orthopedic surgery and Todd Monday at [facility]. - Ambulatory referral to Orthopedic Surgery  4. Gastroesophageal reflux disease, esophagitis presence not specified Patient is currently taking Prilosec 20 mg once a day.  Patient has had on and off control with this.  We will initiate pantoprazole 40 mg recommend going up on the dosing to 40 mg of the Prilosec. - pantoprazole (PROTONIX) 40 MG tablet; Take 1 tablet (40 mg total) by mouth daily.  Dispense: 90 tablet; Refill: 3  5. Fall, initial encounter Patient incurred a fall which resulted in falling backwards landing on wrist with jamming both shoulders.  This occurred 2 weeks ago [place] area.  They are hesitant to use a strong NSAID a in the circumstance that she is having to seek better control of her reflux.  Will refer to [facility] Monday for evaluation in case she is got a labral tear or further worsening of her rotator cuff tear.  6. Breast cancer screening Discussed and patient will undergo screening breast mammogram. - MM 3D SCREEN BREAST BILATERAL; Future  7. Impingement syndrome  of left shoulder Patient has developed a tenderness of the left shoulder with reduced range of motion consistent with impingement syndrome.  Will refer to orthopedics for evaluation and treatment. - Ambulatory referral to Orthopedic Surgery

## 2019-03-22 ENCOUNTER — Other Ambulatory Visit: Payer: Self-pay | Admitting: Orthopedic Surgery

## 2019-03-22 DIAGNOSIS — S46002A Unspecified injury of muscle(s) and tendon(s) of the rotator cuff of left shoulder, initial encounter: Secondary | ICD-10-CM

## 2019-03-22 DIAGNOSIS — M25512 Pain in left shoulder: Secondary | ICD-10-CM

## 2019-03-27 ENCOUNTER — Ambulatory Visit: Payer: BC Managed Care – PPO

## 2019-03-27 ENCOUNTER — Other Ambulatory Visit: Payer: Self-pay

## 2019-03-27 ENCOUNTER — Ambulatory Visit
Admission: RE | Admit: 2019-03-27 | Discharge: 2019-03-27 | Disposition: A | Discharge status: Home / Self Care | Payer: BC Managed Care – PPO | Source: Ambulatory Visit | Attending: Family Medicine | Admitting: Family Medicine

## 2019-03-27 DIAGNOSIS — Z1231 Encounter for screening mammogram for malignant neoplasm of breast: Secondary | ICD-10-CM | POA: Insufficient documentation

## 2019-03-27 DIAGNOSIS — Z1239 Encounter for other screening for malignant neoplasm of breast: Secondary | ICD-10-CM

## 2019-03-28 ENCOUNTER — Ambulatory Visit: Payer: BC Managed Care – PPO

## 2019-03-29 ENCOUNTER — Ambulatory Visit: Payer: BC Managed Care – PPO

## 2019-03-31 ENCOUNTER — Other Ambulatory Visit: Payer: Self-pay | Admitting: Family Medicine

## 2019-04-06 ENCOUNTER — Ambulatory Visit: Payer: BC Managed Care – PPO

## 2019-04-07 ENCOUNTER — Ambulatory Visit
Admission: RE | Admit: 2019-04-07 | Discharge: 2019-04-07 | Disposition: A | Discharge status: Home / Self Care | Payer: Medicare Other | Source: Ambulatory Visit | Attending: Orthopedic Surgery | Admitting: Orthopedic Surgery

## 2019-04-07 ENCOUNTER — Other Ambulatory Visit: Payer: Self-pay

## 2019-04-07 DIAGNOSIS — S46002A Unspecified injury of muscle(s) and tendon(s) of the rotator cuff of left shoulder, initial encounter: Secondary | ICD-10-CM

## 2019-04-07 DIAGNOSIS — S46012A Strain of muscle(s) and tendon(s) of the rotator cuff of left shoulder, initial encounter: Secondary | ICD-10-CM | POA: Diagnosis not present

## 2019-04-07 DIAGNOSIS — M25512 Pain in left shoulder: Secondary | ICD-10-CM | POA: Diagnosis not present

## 2019-04-12 DIAGNOSIS — W1849XA Other slipping, tripping and stumbling without falling, initial encounter: Secondary | ICD-10-CM | POA: Diagnosis not present

## 2019-04-12 DIAGNOSIS — S46012A Strain of muscle(s) and tendon(s) of the rotator cuff of left shoulder, initial encounter: Secondary | ICD-10-CM | POA: Diagnosis not present

## 2019-05-10 DIAGNOSIS — H25013 Cortical age-related cataract, bilateral: Secondary | ICD-10-CM | POA: Diagnosis not present

## 2019-05-17 DIAGNOSIS — W1849XA Other slipping, tripping and stumbling without falling, initial encounter: Secondary | ICD-10-CM | POA: Diagnosis not present

## 2019-05-17 DIAGNOSIS — S46012A Strain of muscle(s) and tendon(s) of the rotator cuff of left shoulder, initial encounter: Secondary | ICD-10-CM | POA: Diagnosis not present

## 2019-05-19 ENCOUNTER — Encounter: Payer: Self-pay | Admitting: Obstetrics & Gynecology

## 2019-05-19 ENCOUNTER — Ambulatory Visit (INDEPENDENT_AMBULATORY_CARE_PROVIDER_SITE_OTHER): Payer: Medicare Other | Admitting: Obstetrics & Gynecology

## 2019-05-19 ENCOUNTER — Other Ambulatory Visit: Payer: Self-pay

## 2019-05-19 VITALS — Ht 59.0 in | Wt 139.0 lb

## 2019-05-19 DIAGNOSIS — E663 Overweight: Secondary | ICD-10-CM | POA: Diagnosis not present

## 2019-05-19 MED ORDER — CONTRAVE 8-90 MG PO TB12: ORAL_TABLET | ORAL | 0 refills | Status: DC

## 2019-05-19 NOTE — Patient Instructions (Signed)
Bupropion; Naltrexone extended-release tablets What is this medicine? BUPROPION; NALTREXONE (byoo PROE pee on; nal TREX one) is a combination product used to promote and maintain weight loss in obese adults or overweight adults who also have weight related medical problems. This medicine should be used with a reduced calorie diet and increased physical activity. This medicine may be used for other purposes; ask your health care provider or pharmacist if you have questions. COMMON BRAND NAME(S): Contrave What should I tell my health care provider before I take this medicine? They need to know if you have any of these conditions:  an eating disorder, such as anorexia or bulimia  bipolar disorder  diabetes  depression  drug abuse or addiction  glaucoma  head injury  heart disease  high blood pressure  history of a tumor or infection of your brain or spine  history of stroke  history of irregular heartbeat  if you often drink alcohol  kidney disease  liver disease  schizophrenia  seizures  suicidal thoughts, plans, or attempt; a previous suicide attempt by you or a family member  an unusual or allergic reaction to bupropion, naltrexone, other medicines, foods, dyes, or preservatives  breast-feeding  pregnant or trying to become pregnant How should I use this medicine? Take this medicine by mouth with a glass of water. Follow the directions on the prescription label. Take this medicine in the morning and in the evenings as directed by your healthcare professional. You can take it with or without food. Do not take with high-fat meals as this may increase your risk of seizures. Do not crush, chew, or cut these tablets. Do not take your medicine more often than directed. Do not stop taking this medicine suddenly except upon the advice of your doctor. A special MedGuide will be given to you by the pharmacist with each prescription and refill. Be sure to read this information  carefully each time. Talk to your pediatrician regarding the use of this medicine in children. Special care may be needed. Overdosage: If you think you have taken too much of this medicine contact a poison control center or emergency room at once. NOTE: This medicine is only for you. Do not share this medicine with others. What if I miss a dose? If you miss a dose, skip the missed dose and take your next tablet at the regular time. Do not take double or extra doses. What may interact with this medicine? Do not take this medicine with any of the following medications:  any prescription or street opioid drug like codeine, heroin, methadone  linezolid  MAOIs like Carbex, Eldepryl, Marplan, Nardil, and Parnate  methylene blue (injected into a vein)  other medicines that contain bupropion like Zyban or Wellbutrin This medicine may also interact with the following medications:  alcohol  certain medicines for anxiety or sleep  certain medicines for blood pressure like metoprolol, propranolol  certain medicines for depression or psychotic disturbances  certain medicines for HIV or AIDS like efavirenz, lopinavir, nelfinavir, ritonavir  certain medicines for irregular heart beat like propafenone, flecainide  certain medicines for Parkinson's disease like amantadine, levodopa  certain medicines for seizures like carbamazepine, phenytoin, phenobarbital  cimetidine  clopidogrel  cyclophosphamide  digoxin  disulfiram  furazolidone  isoniazid  nicotine  orphenadrine  procarbazine  steroid medicines like prednisone or cortisone  stimulant medicines for attention disorders, weight loss, or to stay awake  tamoxifen  theophylline  thioridazine  thiotepa  ticlopidine  tramadol  warfarin This list   may not describe all possible interactions. Give your health care provider a list of all the medicines, herbs, non-prescription drugs, or dietary supplements you use.  Also tell them if you smoke, drink alcohol, or use illegal drugs. Some items may interact with your medicine. What should I watch for while using this medicine? This medicine is intended to be used in addition to a healthy diet and appropriate exercise. The best results are achieved this way. Do not increase or in any way change your dose without consulting your doctor or healthcare provider. Do not take this medicine with other prescription or over-the-counter weight loss products without consulting your doctor or healthcare provider. Your doctor should tell you to stop taking this medicine if you do not lose a certain amount of weight within the first 12 weeks of treatment. Visit your doctor or healthcare provider for regular checkups. Your doctor may order blood tests or other tests to see how you are doing. This medicine may cause serious skin reactions. They can happen weeks to months after starting the medicine. Contact your healthcare provider right away if you notice fevers or flu-like symptoms with a rash. The rash may be red or purple and then turn into blisters or peeling of the skin. Or, you might notice a red rash with swelling of the face, lips or lymph nodes in your neck or under your arms. This medicine may affect blood sugar levels. If you have diabetes, check with your doctor or health care professional before you change your diet or the dose of your diabetic medicine. Patients and their families should watch out for new or worsening depression or thoughts of suicide. Also watch out for sudden changes in feelings such as feeling anxious, agitated, panicky, irritable, hostile, aggressive, impulsive, severely restless, overly excited and hyperactive, or not being able to sleep. If this happens, especially at the beginning of treatment or after a change in dose, call your healthcare provider. Avoid alcoholic drinks while taking this medicine. Drinking large amounts of alcoholic beverages, using  sleeping or anxiety medicines, or quickly stopping the use of these agents while taking this medicine may increase your risk for a seizure. What side effects may I notice from receiving this medicine? Side effects that you should report to your doctor or health care professional as soon as possible:  allergic reactions like skin rash, itching or hives, swelling of the face, lips, or tongue  breathing problems  changes in vision  confusion  elevated mood, decreased need for sleep, racing thoughts, impulsive behavior  fast or irregular heartbeat  hallucinations, loss of contact with reality  increased blood pressure  rash, fever, and swollen lymph nodes  redness, blistering, peeling, or loosening of the skin, including inside the mouth  seizures  signs and symptoms of liver injury like dark yellow or brown urine; general ill feeling or flu-like symptoms; light-colored stools; loss of appetite; nausea; right upper belly pain; unusually weak or tired; yellowing of the eyes or skin  suicidal thoughts or other mood changes  vomiting Side effects that usually do not require medical attention (report to your doctor or health care professional if they continue or are bothersome):  constipation  headache  loss of appetite  indigestion, stomach upset  tremors This list may not describe all possible side effects. Call your doctor for medical advice about side effects. You may report side effects to FDA at 1-800-FDA-1088. Where should I keep my medicine? Keep out of the reach of children. Store at   room temperature between 15 and 30 degrees C (59 and 86 degrees F). Throw away any unused medicine after the expiration date. NOTE: This sheet is a summary. It may not cover all possible information. If you have questions about this medicine, talk to your doctor, pharmacist, or health care provider.  2020 Elsevier/Gold Standard (2018-12-08 14:09:01)  

## 2019-05-19 NOTE — Progress Notes (Signed)
Virtual Visit via Telephone Note I connected with Jennifer Kirby on 05/19/19 at  3:50 PM EDT by telephone and verified that I am speaking with the correct person using two identifiers.   I discussed the limitations, risks, security and privacy concerns of performing an evaluation and management service by telephone and the availability of in person appointments. I also discussed with the patient that there may be a patient responsible charge related to this service. The patient expressed understanding and agreed to proceed. She was at home and I was in my office.  History of Present Illness:   Jennifer Kirby is a 63 y.o. who was started on Phentermine approximately 5 months ago due to obesity/abnormal weight gain. The patient has lost 1 pounds over the past 2 mos due to meds and exercise..   She has these side effects: none.  She has surgery next week and is concerned about weight gain after surgery due to less exercise for a while.  Did well initially on Phentermine and in past.  Desires to lose 10 more lbs.  PMHx: She  has a past medical history of Hyperlipidemia. Also,  has a past surgical history that includes Foot surgery (Left)., family history includes Throat cancer in her father.,  reports that she has never smoked. She has never used smokeless tobacco. She reports current alcohol use. She reports that she does not use drugs.  She has a current medication list which includes the following prescription(s): montelukast, omeprazole, pantoprazole, senna plus, triamcinolone cream, and contrave. Also, is allergic to statins and zetia [ezetimibe].  Review of Systems  All other systems reviewed and are negative.   Assessment and Plan:   ICD-10-CM   1. Overweight (BMI 25.0-29.9)  E66.3   Plan change to Contrave while she has surgery and recovery as well as for change from Phentermine after 5 mos of that therapy (risks of tolerence).   Side effects an drisks explained.  Follow  Up Instructions: As needed   I discussed the assessment and treatment plan with the patient. The patient was provided an opportunity to ask questions and all were answered. The patient agreed with the plan and demonstrated an understanding of the instructions.   The patient was advised to call back or seek an in-person evaluation if the symptoms worsen or if the condition fails to improve as anticipated.  I provided 12 minutes of non-face-to-face time during this encounter.   Hoyt Koch, MD

## 2019-05-26 DIAGNOSIS — M65812 Other synovitis and tenosynovitis, left shoulder: Secondary | ICD-10-CM | POA: Diagnosis not present

## 2019-05-26 DIAGNOSIS — M75122 Complete rotator cuff tear or rupture of left shoulder, not specified as traumatic: Secondary | ICD-10-CM | POA: Diagnosis not present

## 2019-05-26 DIAGNOSIS — S46012A Strain of muscle(s) and tendon(s) of the rotator cuff of left shoulder, initial encounter: Secondary | ICD-10-CM | POA: Insufficient documentation

## 2019-05-26 DIAGNOSIS — K219 Gastro-esophageal reflux disease without esophagitis: Secondary | ICD-10-CM | POA: Diagnosis not present

## 2019-05-26 DIAGNOSIS — M25512 Pain in left shoulder: Secondary | ICD-10-CM | POA: Diagnosis not present

## 2019-05-26 DIAGNOSIS — E785 Hyperlipidemia, unspecified: Secondary | ICD-10-CM | POA: Diagnosis not present

## 2019-05-26 DIAGNOSIS — S46212A Strain of muscle, fascia and tendon of other parts of biceps, left arm, initial encounter: Secondary | ICD-10-CM | POA: Insufficient documentation

## 2019-05-26 DIAGNOSIS — M7552 Bursitis of left shoulder: Secondary | ICD-10-CM | POA: Diagnosis not present

## 2019-05-26 DIAGNOSIS — G4700 Insomnia, unspecified: Secondary | ICD-10-CM | POA: Diagnosis not present

## 2019-05-26 DIAGNOSIS — Z79899 Other long term (current) drug therapy: Secondary | ICD-10-CM | POA: Diagnosis not present

## 2019-05-26 DIAGNOSIS — M7542 Impingement syndrome of left shoulder: Secondary | ICD-10-CM | POA: Diagnosis not present

## 2019-05-26 HISTORY — PX: ROTATOR CUFF REPAIR: SHX139

## 2019-05-27 DIAGNOSIS — M7552 Bursitis of left shoulder: Secondary | ICD-10-CM | POA: Diagnosis not present

## 2019-05-27 DIAGNOSIS — K219 Gastro-esophageal reflux disease without esophagitis: Secondary | ICD-10-CM | POA: Diagnosis not present

## 2019-05-27 DIAGNOSIS — S46212A Strain of muscle, fascia and tendon of other parts of biceps, left arm, initial encounter: Secondary | ICD-10-CM | POA: Diagnosis not present

## 2019-05-27 DIAGNOSIS — G4700 Insomnia, unspecified: Secondary | ICD-10-CM | POA: Diagnosis not present

## 2019-05-27 DIAGNOSIS — Z79899 Other long term (current) drug therapy: Secondary | ICD-10-CM | POA: Diagnosis not present

## 2019-05-27 DIAGNOSIS — M65812 Other synovitis and tenosynovitis, left shoulder: Secondary | ICD-10-CM | POA: Diagnosis not present

## 2019-05-27 DIAGNOSIS — E785 Hyperlipidemia, unspecified: Secondary | ICD-10-CM | POA: Diagnosis not present

## 2019-05-27 DIAGNOSIS — M7542 Impingement syndrome of left shoulder: Secondary | ICD-10-CM | POA: Diagnosis not present

## 2019-05-27 DIAGNOSIS — S46012A Strain of muscle(s) and tendon(s) of the rotator cuff of left shoulder, initial encounter: Secondary | ICD-10-CM | POA: Diagnosis not present

## 2019-05-29 DIAGNOSIS — M25512 Pain in left shoulder: Secondary | ICD-10-CM | POA: Diagnosis not present

## 2019-05-29 DIAGNOSIS — M7582 Other shoulder lesions, left shoulder: Secondary | ICD-10-CM | POA: Diagnosis not present

## 2019-05-29 DIAGNOSIS — M25612 Stiffness of left shoulder, not elsewhere classified: Secondary | ICD-10-CM | POA: Diagnosis not present

## 2019-06-01 DIAGNOSIS — M7582 Other shoulder lesions, left shoulder: Secondary | ICD-10-CM | POA: Diagnosis not present

## 2019-06-01 DIAGNOSIS — M25512 Pain in left shoulder: Secondary | ICD-10-CM | POA: Diagnosis not present

## 2019-06-01 DIAGNOSIS — M25612 Stiffness of left shoulder, not elsewhere classified: Secondary | ICD-10-CM | POA: Diagnosis not present

## 2019-06-06 DIAGNOSIS — M25512 Pain in left shoulder: Secondary | ICD-10-CM | POA: Diagnosis not present

## 2019-06-06 DIAGNOSIS — M7582 Other shoulder lesions, left shoulder: Secondary | ICD-10-CM | POA: Diagnosis not present

## 2019-06-06 DIAGNOSIS — M25612 Stiffness of left shoulder, not elsewhere classified: Secondary | ICD-10-CM | POA: Diagnosis not present

## 2019-06-08 DIAGNOSIS — M25512 Pain in left shoulder: Secondary | ICD-10-CM | POA: Diagnosis not present

## 2019-06-08 DIAGNOSIS — M25612 Stiffness of left shoulder, not elsewhere classified: Secondary | ICD-10-CM | POA: Diagnosis not present

## 2019-06-08 DIAGNOSIS — M7582 Other shoulder lesions, left shoulder: Secondary | ICD-10-CM | POA: Diagnosis not present

## 2019-06-12 DIAGNOSIS — M25512 Pain in left shoulder: Secondary | ICD-10-CM | POA: Diagnosis not present

## 2019-06-12 DIAGNOSIS — M7582 Other shoulder lesions, left shoulder: Secondary | ICD-10-CM | POA: Diagnosis not present

## 2019-06-12 DIAGNOSIS — M25612 Stiffness of left shoulder, not elsewhere classified: Secondary | ICD-10-CM | POA: Diagnosis not present

## 2019-06-15 DIAGNOSIS — M7582 Other shoulder lesions, left shoulder: Secondary | ICD-10-CM | POA: Diagnosis not present

## 2019-06-15 DIAGNOSIS — M25612 Stiffness of left shoulder, not elsewhere classified: Secondary | ICD-10-CM | POA: Diagnosis not present

## 2019-06-15 DIAGNOSIS — M25512 Pain in left shoulder: Secondary | ICD-10-CM | POA: Diagnosis not present

## 2019-06-20 DIAGNOSIS — M7582 Other shoulder lesions, left shoulder: Secondary | ICD-10-CM | POA: Diagnosis not present

## 2019-06-20 DIAGNOSIS — M25512 Pain in left shoulder: Secondary | ICD-10-CM | POA: Diagnosis not present

## 2019-06-20 DIAGNOSIS — M25612 Stiffness of left shoulder, not elsewhere classified: Secondary | ICD-10-CM | POA: Diagnosis not present

## 2019-06-22 DIAGNOSIS — M25612 Stiffness of left shoulder, not elsewhere classified: Secondary | ICD-10-CM | POA: Diagnosis not present

## 2019-06-22 DIAGNOSIS — M7582 Other shoulder lesions, left shoulder: Secondary | ICD-10-CM | POA: Diagnosis not present

## 2019-06-22 DIAGNOSIS — M25512 Pain in left shoulder: Secondary | ICD-10-CM | POA: Diagnosis not present

## 2019-06-26 DIAGNOSIS — M25512 Pain in left shoulder: Secondary | ICD-10-CM | POA: Diagnosis not present

## 2019-06-26 DIAGNOSIS — M7582 Other shoulder lesions, left shoulder: Secondary | ICD-10-CM | POA: Diagnosis not present

## 2019-06-26 DIAGNOSIS — M75122 Complete rotator cuff tear or rupture of left shoulder, not specified as traumatic: Secondary | ICD-10-CM | POA: Diagnosis not present

## 2019-06-26 DIAGNOSIS — M25612 Stiffness of left shoulder, not elsewhere classified: Secondary | ICD-10-CM | POA: Diagnosis not present

## 2019-06-28 DIAGNOSIS — M7582 Other shoulder lesions, left shoulder: Secondary | ICD-10-CM | POA: Diagnosis not present

## 2019-06-28 DIAGNOSIS — M25512 Pain in left shoulder: Secondary | ICD-10-CM | POA: Diagnosis not present

## 2019-06-28 DIAGNOSIS — M25612 Stiffness of left shoulder, not elsewhere classified: Secondary | ICD-10-CM | POA: Diagnosis not present

## 2019-07-03 DIAGNOSIS — M25612 Stiffness of left shoulder, not elsewhere classified: Secondary | ICD-10-CM | POA: Diagnosis not present

## 2019-07-03 DIAGNOSIS — M7582 Other shoulder lesions, left shoulder: Secondary | ICD-10-CM | POA: Diagnosis not present

## 2019-07-03 DIAGNOSIS — M25512 Pain in left shoulder: Secondary | ICD-10-CM | POA: Diagnosis not present

## 2019-07-06 DIAGNOSIS — M25512 Pain in left shoulder: Secondary | ICD-10-CM | POA: Diagnosis not present

## 2019-07-06 DIAGNOSIS — M7582 Other shoulder lesions, left shoulder: Secondary | ICD-10-CM | POA: Diagnosis not present

## 2019-07-06 DIAGNOSIS — M25612 Stiffness of left shoulder, not elsewhere classified: Secondary | ICD-10-CM | POA: Diagnosis not present

## 2019-07-10 DIAGNOSIS — M7582 Other shoulder lesions, left shoulder: Secondary | ICD-10-CM | POA: Diagnosis not present

## 2019-07-10 DIAGNOSIS — M25612 Stiffness of left shoulder, not elsewhere classified: Secondary | ICD-10-CM | POA: Diagnosis not present

## 2019-07-10 DIAGNOSIS — M25512 Pain in left shoulder: Secondary | ICD-10-CM | POA: Diagnosis not present

## 2019-07-12 DIAGNOSIS — M25612 Stiffness of left shoulder, not elsewhere classified: Secondary | ICD-10-CM | POA: Diagnosis not present

## 2019-07-12 DIAGNOSIS — M25512 Pain in left shoulder: Secondary | ICD-10-CM | POA: Diagnosis not present

## 2019-07-12 DIAGNOSIS — M7582 Other shoulder lesions, left shoulder: Secondary | ICD-10-CM | POA: Diagnosis not present

## 2019-07-14 DIAGNOSIS — M25512 Pain in left shoulder: Secondary | ICD-10-CM | POA: Diagnosis not present

## 2019-07-14 DIAGNOSIS — M7582 Other shoulder lesions, left shoulder: Secondary | ICD-10-CM | POA: Diagnosis not present

## 2019-07-14 DIAGNOSIS — M25612 Stiffness of left shoulder, not elsewhere classified: Secondary | ICD-10-CM | POA: Diagnosis not present

## 2019-07-17 DIAGNOSIS — M7582 Other shoulder lesions, left shoulder: Secondary | ICD-10-CM | POA: Diagnosis not present

## 2019-07-17 DIAGNOSIS — M25512 Pain in left shoulder: Secondary | ICD-10-CM | POA: Diagnosis not present

## 2019-07-17 DIAGNOSIS — M25612 Stiffness of left shoulder, not elsewhere classified: Secondary | ICD-10-CM | POA: Diagnosis not present

## 2019-07-20 DIAGNOSIS — M25612 Stiffness of left shoulder, not elsewhere classified: Secondary | ICD-10-CM | POA: Diagnosis not present

## 2019-07-20 DIAGNOSIS — M7582 Other shoulder lesions, left shoulder: Secondary | ICD-10-CM | POA: Diagnosis not present

## 2019-07-20 DIAGNOSIS — M25512 Pain in left shoulder: Secondary | ICD-10-CM | POA: Diagnosis not present

## 2019-07-21 DIAGNOSIS — M7582 Other shoulder lesions, left shoulder: Secondary | ICD-10-CM | POA: Diagnosis not present

## 2019-07-21 DIAGNOSIS — M25612 Stiffness of left shoulder, not elsewhere classified: Secondary | ICD-10-CM | POA: Diagnosis not present

## 2019-07-21 DIAGNOSIS — M25512 Pain in left shoulder: Secondary | ICD-10-CM | POA: Diagnosis not present

## 2019-07-24 DIAGNOSIS — M25612 Stiffness of left shoulder, not elsewhere classified: Secondary | ICD-10-CM | POA: Diagnosis not present

## 2019-07-24 DIAGNOSIS — M7582 Other shoulder lesions, left shoulder: Secondary | ICD-10-CM | POA: Diagnosis not present

## 2019-07-24 DIAGNOSIS — M25512 Pain in left shoulder: Secondary | ICD-10-CM | POA: Diagnosis not present

## 2019-07-27 DIAGNOSIS — M25612 Stiffness of left shoulder, not elsewhere classified: Secondary | ICD-10-CM | POA: Diagnosis not present

## 2019-07-27 DIAGNOSIS — M25512 Pain in left shoulder: Secondary | ICD-10-CM | POA: Diagnosis not present

## 2019-07-27 DIAGNOSIS — M7582 Other shoulder lesions, left shoulder: Secondary | ICD-10-CM | POA: Diagnosis not present

## 2019-07-31 DIAGNOSIS — M7582 Other shoulder lesions, left shoulder: Secondary | ICD-10-CM | POA: Diagnosis not present

## 2019-07-31 DIAGNOSIS — M25612 Stiffness of left shoulder, not elsewhere classified: Secondary | ICD-10-CM | POA: Diagnosis not present

## 2019-07-31 DIAGNOSIS — M25512 Pain in left shoulder: Secondary | ICD-10-CM | POA: Diagnosis not present

## 2019-08-03 DIAGNOSIS — M25612 Stiffness of left shoulder, not elsewhere classified: Secondary | ICD-10-CM | POA: Diagnosis not present

## 2019-08-03 DIAGNOSIS — M7582 Other shoulder lesions, left shoulder: Secondary | ICD-10-CM | POA: Diagnosis not present

## 2019-08-03 DIAGNOSIS — M25512 Pain in left shoulder: Secondary | ICD-10-CM | POA: Diagnosis not present

## 2019-08-08 ENCOUNTER — Other Ambulatory Visit: Payer: Self-pay

## 2019-08-08 DIAGNOSIS — Z20822 Contact with and (suspected) exposure to covid-19: Secondary | ICD-10-CM

## 2019-08-10 LAB — NOVEL CORONAVIRUS, NAA: SARS-CoV-2, NAA: DETECTED — AB

## 2019-08-21 DIAGNOSIS — M25512 Pain in left shoulder: Secondary | ICD-10-CM | POA: Diagnosis not present

## 2019-08-21 DIAGNOSIS — M7582 Other shoulder lesions, left shoulder: Secondary | ICD-10-CM | POA: Diagnosis not present

## 2019-08-21 DIAGNOSIS — M25612 Stiffness of left shoulder, not elsewhere classified: Secondary | ICD-10-CM | POA: Diagnosis not present

## 2019-08-23 DIAGNOSIS — M25612 Stiffness of left shoulder, not elsewhere classified: Secondary | ICD-10-CM | POA: Diagnosis not present

## 2019-08-23 DIAGNOSIS — M7582 Other shoulder lesions, left shoulder: Secondary | ICD-10-CM | POA: Diagnosis not present

## 2019-08-23 DIAGNOSIS — M25512 Pain in left shoulder: Secondary | ICD-10-CM | POA: Diagnosis not present

## 2019-08-28 DIAGNOSIS — M25512 Pain in left shoulder: Secondary | ICD-10-CM | POA: Diagnosis not present

## 2019-08-28 DIAGNOSIS — M7582 Other shoulder lesions, left shoulder: Secondary | ICD-10-CM | POA: Diagnosis not present

## 2019-08-28 DIAGNOSIS — M25612 Stiffness of left shoulder, not elsewhere classified: Secondary | ICD-10-CM | POA: Diagnosis not present

## 2019-08-31 DIAGNOSIS — M7582 Other shoulder lesions, left shoulder: Secondary | ICD-10-CM | POA: Diagnosis not present

## 2019-08-31 DIAGNOSIS — M25612 Stiffness of left shoulder, not elsewhere classified: Secondary | ICD-10-CM | POA: Diagnosis not present

## 2019-08-31 DIAGNOSIS — M25512 Pain in left shoulder: Secondary | ICD-10-CM | POA: Diagnosis not present

## 2019-09-05 DIAGNOSIS — M7582 Other shoulder lesions, left shoulder: Secondary | ICD-10-CM | POA: Diagnosis not present

## 2019-09-05 DIAGNOSIS — M25612 Stiffness of left shoulder, not elsewhere classified: Secondary | ICD-10-CM | POA: Diagnosis not present

## 2019-09-05 DIAGNOSIS — M25512 Pain in left shoulder: Secondary | ICD-10-CM | POA: Diagnosis not present

## 2019-09-07 DIAGNOSIS — M25612 Stiffness of left shoulder, not elsewhere classified: Secondary | ICD-10-CM | POA: Diagnosis not present

## 2019-09-07 DIAGNOSIS — M25512 Pain in left shoulder: Secondary | ICD-10-CM | POA: Diagnosis not present

## 2019-09-07 DIAGNOSIS — M7582 Other shoulder lesions, left shoulder: Secondary | ICD-10-CM | POA: Diagnosis not present

## 2019-09-11 DIAGNOSIS — M25612 Stiffness of left shoulder, not elsewhere classified: Secondary | ICD-10-CM | POA: Diagnosis not present

## 2019-09-11 DIAGNOSIS — M25512 Pain in left shoulder: Secondary | ICD-10-CM | POA: Diagnosis not present

## 2019-09-11 DIAGNOSIS — M7582 Other shoulder lesions, left shoulder: Secondary | ICD-10-CM | POA: Diagnosis not present

## 2019-09-13 DIAGNOSIS — W1849XD Other slipping, tripping and stumbling without falling, subsequent encounter: Secondary | ICD-10-CM | POA: Diagnosis not present

## 2019-09-13 DIAGNOSIS — S46012D Strain of muscle(s) and tendon(s) of the rotator cuff of left shoulder, subsequent encounter: Secondary | ICD-10-CM | POA: Diagnosis not present

## 2019-09-13 DIAGNOSIS — Z9889 Other specified postprocedural states: Secondary | ICD-10-CM | POA: Diagnosis not present

## 2019-09-14 DIAGNOSIS — M7582 Other shoulder lesions, left shoulder: Secondary | ICD-10-CM | POA: Diagnosis not present

## 2019-09-14 DIAGNOSIS — M25512 Pain in left shoulder: Secondary | ICD-10-CM | POA: Diagnosis not present

## 2019-09-14 DIAGNOSIS — M25612 Stiffness of left shoulder, not elsewhere classified: Secondary | ICD-10-CM | POA: Diagnosis not present

## 2019-09-18 DIAGNOSIS — M25512 Pain in left shoulder: Secondary | ICD-10-CM | POA: Diagnosis not present

## 2019-09-18 DIAGNOSIS — M7582 Other shoulder lesions, left shoulder: Secondary | ICD-10-CM | POA: Diagnosis not present

## 2019-09-18 DIAGNOSIS — M25612 Stiffness of left shoulder, not elsewhere classified: Secondary | ICD-10-CM | POA: Diagnosis not present

## 2019-09-21 DIAGNOSIS — M25512 Pain in left shoulder: Secondary | ICD-10-CM | POA: Diagnosis not present

## 2019-09-21 DIAGNOSIS — M25612 Stiffness of left shoulder, not elsewhere classified: Secondary | ICD-10-CM | POA: Diagnosis not present

## 2019-09-21 DIAGNOSIS — M7582 Other shoulder lesions, left shoulder: Secondary | ICD-10-CM | POA: Diagnosis not present

## 2019-09-25 DIAGNOSIS — M25612 Stiffness of left shoulder, not elsewhere classified: Secondary | ICD-10-CM | POA: Diagnosis not present

## 2019-09-25 DIAGNOSIS — M7582 Other shoulder lesions, left shoulder: Secondary | ICD-10-CM | POA: Diagnosis not present

## 2019-09-25 DIAGNOSIS — M25512 Pain in left shoulder: Secondary | ICD-10-CM | POA: Diagnosis not present

## 2019-09-28 DIAGNOSIS — M25612 Stiffness of left shoulder, not elsewhere classified: Secondary | ICD-10-CM | POA: Diagnosis not present

## 2019-09-28 DIAGNOSIS — M25512 Pain in left shoulder: Secondary | ICD-10-CM | POA: Diagnosis not present

## 2019-09-28 DIAGNOSIS — M7582 Other shoulder lesions, left shoulder: Secondary | ICD-10-CM | POA: Diagnosis not present

## 2019-10-02 DIAGNOSIS — M7582 Other shoulder lesions, left shoulder: Secondary | ICD-10-CM | POA: Diagnosis not present

## 2019-10-02 DIAGNOSIS — M25612 Stiffness of left shoulder, not elsewhere classified: Secondary | ICD-10-CM | POA: Diagnosis not present

## 2019-10-02 DIAGNOSIS — M25512 Pain in left shoulder: Secondary | ICD-10-CM | POA: Diagnosis not present

## 2019-10-05 DIAGNOSIS — M25612 Stiffness of left shoulder, not elsewhere classified: Secondary | ICD-10-CM | POA: Diagnosis not present

## 2019-10-05 DIAGNOSIS — M7582 Other shoulder lesions, left shoulder: Secondary | ICD-10-CM | POA: Diagnosis not present

## 2019-10-05 DIAGNOSIS — M25512 Pain in left shoulder: Secondary | ICD-10-CM | POA: Diagnosis not present

## 2019-10-09 DIAGNOSIS — M25512 Pain in left shoulder: Secondary | ICD-10-CM | POA: Diagnosis not present

## 2019-10-09 DIAGNOSIS — M25612 Stiffness of left shoulder, not elsewhere classified: Secondary | ICD-10-CM | POA: Diagnosis not present

## 2019-10-09 DIAGNOSIS — M7582 Other shoulder lesions, left shoulder: Secondary | ICD-10-CM | POA: Diagnosis not present

## 2019-10-12 DIAGNOSIS — M25612 Stiffness of left shoulder, not elsewhere classified: Secondary | ICD-10-CM | POA: Diagnosis not present

## 2019-10-12 DIAGNOSIS — M25512 Pain in left shoulder: Secondary | ICD-10-CM | POA: Diagnosis not present

## 2019-10-12 DIAGNOSIS — M7582 Other shoulder lesions, left shoulder: Secondary | ICD-10-CM | POA: Diagnosis not present

## 2019-10-16 DIAGNOSIS — M25512 Pain in left shoulder: Secondary | ICD-10-CM | POA: Diagnosis not present

## 2019-10-16 DIAGNOSIS — M7582 Other shoulder lesions, left shoulder: Secondary | ICD-10-CM | POA: Diagnosis not present

## 2019-10-16 DIAGNOSIS — M25612 Stiffness of left shoulder, not elsewhere classified: Secondary | ICD-10-CM | POA: Diagnosis not present

## 2019-10-18 DIAGNOSIS — M25612 Stiffness of left shoulder, not elsewhere classified: Secondary | ICD-10-CM | POA: Diagnosis not present

## 2019-10-18 DIAGNOSIS — M7582 Other shoulder lesions, left shoulder: Secondary | ICD-10-CM | POA: Diagnosis not present

## 2019-10-18 DIAGNOSIS — M25512 Pain in left shoulder: Secondary | ICD-10-CM | POA: Diagnosis not present

## 2019-10-23 DIAGNOSIS — M25612 Stiffness of left shoulder, not elsewhere classified: Secondary | ICD-10-CM | POA: Diagnosis not present

## 2019-10-23 DIAGNOSIS — M25512 Pain in left shoulder: Secondary | ICD-10-CM | POA: Diagnosis not present

## 2019-10-23 DIAGNOSIS — M7582 Other shoulder lesions, left shoulder: Secondary | ICD-10-CM | POA: Diagnosis not present

## 2019-10-26 DIAGNOSIS — M25512 Pain in left shoulder: Secondary | ICD-10-CM | POA: Diagnosis not present

## 2019-10-26 DIAGNOSIS — M25612 Stiffness of left shoulder, not elsewhere classified: Secondary | ICD-10-CM | POA: Diagnosis not present

## 2019-10-26 DIAGNOSIS — M7582 Other shoulder lesions, left shoulder: Secondary | ICD-10-CM | POA: Diagnosis not present

## 2019-10-31 ENCOUNTER — Encounter: Payer: Self-pay | Admitting: Family Medicine

## 2019-10-31 ENCOUNTER — Ambulatory Visit (INDEPENDENT_AMBULATORY_CARE_PROVIDER_SITE_OTHER): Payer: Medicare Other | Admitting: Family Medicine

## 2019-10-31 ENCOUNTER — Other Ambulatory Visit: Payer: Self-pay

## 2019-10-31 VITALS — Temp 97.1°F

## 2019-10-31 DIAGNOSIS — J0100 Acute maxillary sinusitis, unspecified: Secondary | ICD-10-CM

## 2019-10-31 DIAGNOSIS — R059 Cough, unspecified: Secondary | ICD-10-CM

## 2019-10-31 DIAGNOSIS — R05 Cough: Secondary | ICD-10-CM

## 2019-10-31 MED ORDER — GUAIFENESIN-CODEINE 100-10 MG/5ML PO SYRP: 5.0000 mL | ORAL_SOLUTION | Freq: Four times a day (QID) | ORAL | 0 refills | Status: DC | PRN

## 2019-10-31 MED ORDER — AMOXICILLIN-POT CLAVULANATE 875-125 MG PO TABS: 1.0000 | ORAL_TABLET | Freq: Two times a day (BID) | ORAL | 0 refills | Status: DC

## 2019-10-31 NOTE — Progress Notes (Signed)
Date:  10/31/2019   Name:  Jennifer Kirby   DOB:  1956-05-19   MRN:  [id number]   Chief Complaint: Sinusitis (started over the weekend with head cong, hoarse. Green production, cough, no fever. Sneezing and both ears stopped up)  I connected withthis patient, Jennifer Kirby, by telephoneat the patient's home.  I verified that I am speaking with the correct person using two identifiers. This visit was conducted via telephone due to the Covid-19 outbreak from my office at [facility] in [place], [place]. I discussed the limitations, risks, security and privacy concerns of performing an evaluation and management service by telephone. I also discussed with the patient that there may be a patient responsible charge related to this service. The patient expressed understanding and agreed to proceed.  Sinusitis This is a new (saturday) problem. The current episode started in the past 7 days. The problem has been waxing and waning since onset. There has been no fever. Her pain is at a severity of 2/10. The pain is mild. Associated symptoms include chills, congestion, coughing, diaphoresis, ear pain, a hoarse voice, sinus pressure and a sore throat. Pertinent negatives include no headaches, neck pain, shortness of breath, sneezing or swollen glands. (Rhinorrhea) Past treatments include oral decongestants and acetaminophen. The treatment provided no relief.    Lab Results  Component Value Date   CREATININE 0.54 (L) 07/28/2017   BUN 8 07/28/2017   NA 141 07/28/2017   K 4.2 07/28/2017   CL 101 07/28/2017   CO2 24 07/28/2017   Lab Results  Component Value Date   CHOL 260 (H) 07/28/2017   HDL 75 07/28/2017   LDLCALC 166 (H) 07/28/2017   TRIG 96 07/28/2017   CHOLHDL 3.5 07/28/2017   No results found for: TSH No results found for: HGBA1C   Review of Systems  Constitutional: Positive for chills and diaphoresis. Negative for fatigue, fever and unexpected weight change.  HENT:  Positive for congestion, ear pain, hoarse voice, sinus pressure and sore throat. Negative for ear discharge, rhinorrhea and sneezing.   Eyes: Negative for photophobia, pain, discharge, redness and itching.  Respiratory: Positive for cough. Negative for shortness of breath, wheezing and stridor.   Cardiovascular: Negative for chest pain, palpitations and leg swelling.  Gastrointestinal: Negative for abdominal pain, blood in stool, constipation, diarrhea, nausea and vomiting.  Endocrine: Negative for cold intolerance, heat intolerance, polydipsia, polyphagia and polyuria.  Genitourinary: Negative for dysuria, flank pain, frequency, hematuria, menstrual problem, pelvic pain, urgency, vaginal bleeding and vaginal discharge.  Musculoskeletal: Negative for arthralgias, back pain, myalgias and neck pain.  Skin: Negative for rash.  Allergic/Immunologic: Negative for environmental allergies and food allergies.  Neurological: Negative for dizziness, weakness, light-headedness, numbness and headaches.  Hematological: Negative for adenopathy. Does not bruise/bleed easily.  Psychiatric/Behavioral: Negative for dysphoric mood. The patient is not nervous/anxious.     Patient Active Problem List   Diagnosis Date Noted  . Obesity (BMI 30-39.9) 11/25/2018  . Contact dermatitis 02/15/2018  . Mass of skin of right shoulder 07/27/2016  . Brachial neuritis 03/08/2015  . Encounter for general adult medical examination without abnormal findings 03/08/2015  . Herpes zoster 03/08/2015  . H/O hypercholesterolemia 03/08/2015  . HLD (hyperlipidemia) 03/08/2015    Allergies  Allergen Reactions  . Statins   . Zetia [Ezetimibe] Other (See Comments)    Gave leg cramps    Past Surgical History:  Procedure Laterality Date  . FOOT SURGERY Left    plantar fasciatis and broken  foot    Social History   Tobacco Use  . Smoking status: Never Smoker  . Smokeless tobacco: Never Used  Substance Use Topics  .  Alcohol use: Yes    Alcohol/week: 0.0 standard drinks  . Drug use: No     Medication list has been reviewed and updated.  Current Meds  Medication Sig  . pantoprazole (PROTONIX) 40 MG tablet Take 1 tablet (40 mg total) by mouth daily.  Orlie Dakin Sodium (SENNA PLUS) 8.6-50 MG CAPS Take 2 capsules by mouth daily.  Marland Kitchen triamcinolone cream (KENALOG) 0.1 % Apply 1 application topically 2 (two) times daily.  . [DISCONTINUED] omeprazole (PRILOSEC) 20 MG capsule TAKE 1 CAPSULE BY MOUTH EVERY DAY    PHQ 2/9 Scores 10/31/2019 07/06/2017 12/30/2015  PHQ - 2 Score 0 0 2  PHQ- 9 Score 0 0 -    BP Readings from Last 3 Encounters:  03/20/19 110/64  11/25/18 140/90  05/12/18 (!) 144/78    Physical Exam Nursing note reviewed.  Constitutional:      General: She is not in acute distress.    Appearance: She is not diaphoretic.  Neck:     Thyroid: No thyromegaly.     Vascular: No JVD.  Neurological:     Mental Status: She is alert.     Deep Tendon Reflexes: Reflexes are normal and symmetric.     Wt Readings from Last 3 Encounters:  05/19/19 139 lb (63 kg)  03/20/19 139 lb (63 kg)  02/22/19 140 lb (63.5 kg)    Temp (!) 97.1 F (36.2 C) (Oral)    Assessment and Plan: 1. Acute maxillary sinusitis, recurrence not specified Acute.  Persistent.  Continue Augmentin 875 mg twice a day. - amoxicillin-clavulanate (AUGMENTIN) 875-125 MG tablet; Take 1 tablet by mouth 2 (two) times daily.  Dispense: 20 tablet; Refill: 0  2. Cough Acute.  Persistent.  Will initiate Robitussin-AC 1 teaspoon every 6 hours as needed for cough. - guaiFENesin-codeine (ROBITUSSIN AC) 100-10 MG/5ML syrup; Take 5 mLs by mouth 4 (four) times daily as needed for cough.  Dispense: 118 mL; Refill: 0

## 2019-11-08 DIAGNOSIS — M25612 Stiffness of left shoulder, not elsewhere classified: Secondary | ICD-10-CM | POA: Diagnosis not present

## 2019-11-08 DIAGNOSIS — M25512 Pain in left shoulder: Secondary | ICD-10-CM | POA: Diagnosis not present

## 2019-11-08 DIAGNOSIS — M7582 Other shoulder lesions, left shoulder: Secondary | ICD-10-CM | POA: Diagnosis not present

## 2019-11-10 DIAGNOSIS — M25612 Stiffness of left shoulder, not elsewhere classified: Secondary | ICD-10-CM | POA: Diagnosis not present

## 2019-11-10 DIAGNOSIS — M7582 Other shoulder lesions, left shoulder: Secondary | ICD-10-CM | POA: Diagnosis not present

## 2019-11-10 DIAGNOSIS — M25512 Pain in left shoulder: Secondary | ICD-10-CM | POA: Diagnosis not present

## 2019-11-13 DIAGNOSIS — M25512 Pain in left shoulder: Secondary | ICD-10-CM | POA: Diagnosis not present

## 2019-11-13 DIAGNOSIS — M7582 Other shoulder lesions, left shoulder: Secondary | ICD-10-CM | POA: Diagnosis not present

## 2019-11-13 DIAGNOSIS — M25612 Stiffness of left shoulder, not elsewhere classified: Secondary | ICD-10-CM | POA: Diagnosis not present

## 2019-11-20 DIAGNOSIS — M7582 Other shoulder lesions, left shoulder: Secondary | ICD-10-CM | POA: Diagnosis not present

## 2019-11-20 DIAGNOSIS — M25512 Pain in left shoulder: Secondary | ICD-10-CM | POA: Diagnosis not present

## 2019-11-20 DIAGNOSIS — M25612 Stiffness of left shoulder, not elsewhere classified: Secondary | ICD-10-CM | POA: Diagnosis not present

## 2019-11-22 DIAGNOSIS — M7582 Other shoulder lesions, left shoulder: Secondary | ICD-10-CM | POA: Diagnosis not present

## 2019-11-22 DIAGNOSIS — S46012D Strain of muscle(s) and tendon(s) of the rotator cuff of left shoulder, subsequent encounter: Secondary | ICD-10-CM | POA: Diagnosis not present

## 2019-11-22 DIAGNOSIS — M25512 Pain in left shoulder: Secondary | ICD-10-CM | POA: Diagnosis not present

## 2019-11-22 DIAGNOSIS — M7542 Impingement syndrome of left shoulder: Secondary | ICD-10-CM | POA: Diagnosis not present

## 2019-11-22 DIAGNOSIS — S46212D Strain of muscle, fascia and tendon of other parts of biceps, left arm, subsequent encounter: Secondary | ICD-10-CM | POA: Diagnosis not present

## 2019-11-22 DIAGNOSIS — M25612 Stiffness of left shoulder, not elsewhere classified: Secondary | ICD-10-CM | POA: Diagnosis not present

## 2019-11-22 DIAGNOSIS — W1849XD Other slipping, tripping and stumbling without falling, subsequent encounter: Secondary | ICD-10-CM | POA: Diagnosis not present

## 2019-11-22 DIAGNOSIS — M65812 Other synovitis and tenosynovitis, left shoulder: Secondary | ICD-10-CM | POA: Diagnosis not present

## 2019-11-27 DIAGNOSIS — M25612 Stiffness of left shoulder, not elsewhere classified: Secondary | ICD-10-CM | POA: Diagnosis not present

## 2019-11-27 DIAGNOSIS — M7582 Other shoulder lesions, left shoulder: Secondary | ICD-10-CM | POA: Diagnosis not present

## 2019-11-27 DIAGNOSIS — M25512 Pain in left shoulder: Secondary | ICD-10-CM | POA: Diagnosis not present

## 2019-11-30 DIAGNOSIS — M25612 Stiffness of left shoulder, not elsewhere classified: Secondary | ICD-10-CM | POA: Diagnosis not present

## 2019-11-30 DIAGNOSIS — M7582 Other shoulder lesions, left shoulder: Secondary | ICD-10-CM | POA: Diagnosis not present

## 2019-11-30 DIAGNOSIS — M25512 Pain in left shoulder: Secondary | ICD-10-CM | POA: Diagnosis not present

## 2019-12-01 DIAGNOSIS — M25512 Pain in left shoulder: Secondary | ICD-10-CM | POA: Diagnosis not present

## 2019-12-01 DIAGNOSIS — M25612 Stiffness of left shoulder, not elsewhere classified: Secondary | ICD-10-CM | POA: Diagnosis not present

## 2019-12-01 DIAGNOSIS — M7582 Other shoulder lesions, left shoulder: Secondary | ICD-10-CM | POA: Diagnosis not present

## 2019-12-04 DIAGNOSIS — M25612 Stiffness of left shoulder, not elsewhere classified: Secondary | ICD-10-CM | POA: Diagnosis not present

## 2019-12-04 DIAGNOSIS — M7582 Other shoulder lesions, left shoulder: Secondary | ICD-10-CM | POA: Diagnosis not present

## 2019-12-04 DIAGNOSIS — M25512 Pain in left shoulder: Secondary | ICD-10-CM | POA: Diagnosis not present

## 2019-12-11 DIAGNOSIS — M7582 Other shoulder lesions, left shoulder: Secondary | ICD-10-CM | POA: Diagnosis not present

## 2019-12-11 DIAGNOSIS — M25612 Stiffness of left shoulder, not elsewhere classified: Secondary | ICD-10-CM | POA: Diagnosis not present

## 2019-12-11 DIAGNOSIS — M25512 Pain in left shoulder: Secondary | ICD-10-CM | POA: Diagnosis not present

## 2019-12-14 DIAGNOSIS — M25612 Stiffness of left shoulder, not elsewhere classified: Secondary | ICD-10-CM | POA: Diagnosis not present

## 2019-12-14 DIAGNOSIS — M7582 Other shoulder lesions, left shoulder: Secondary | ICD-10-CM | POA: Diagnosis not present

## 2019-12-14 DIAGNOSIS — M25512 Pain in left shoulder: Secondary | ICD-10-CM | POA: Diagnosis not present

## 2019-12-18 DIAGNOSIS — M7582 Other shoulder lesions, left shoulder: Secondary | ICD-10-CM | POA: Diagnosis not present

## 2019-12-18 DIAGNOSIS — M25612 Stiffness of left shoulder, not elsewhere classified: Secondary | ICD-10-CM | POA: Diagnosis not present

## 2019-12-18 DIAGNOSIS — M25512 Pain in left shoulder: Secondary | ICD-10-CM | POA: Diagnosis not present

## 2019-12-21 DIAGNOSIS — M25612 Stiffness of left shoulder, not elsewhere classified: Secondary | ICD-10-CM | POA: Diagnosis not present

## 2019-12-21 DIAGNOSIS — M7582 Other shoulder lesions, left shoulder: Secondary | ICD-10-CM | POA: Diagnosis not present

## 2019-12-21 DIAGNOSIS — M25512 Pain in left shoulder: Secondary | ICD-10-CM | POA: Diagnosis not present

## 2019-12-25 DIAGNOSIS — M7582 Other shoulder lesions, left shoulder: Secondary | ICD-10-CM | POA: Diagnosis not present

## 2019-12-25 DIAGNOSIS — M25512 Pain in left shoulder: Secondary | ICD-10-CM | POA: Diagnosis not present

## 2019-12-25 DIAGNOSIS — M25612 Stiffness of left shoulder, not elsewhere classified: Secondary | ICD-10-CM | POA: Diagnosis not present

## 2019-12-28 DIAGNOSIS — M25612 Stiffness of left shoulder, not elsewhere classified: Secondary | ICD-10-CM | POA: Diagnosis not present

## 2019-12-28 DIAGNOSIS — M25512 Pain in left shoulder: Secondary | ICD-10-CM | POA: Diagnosis not present

## 2019-12-28 DIAGNOSIS — M7582 Other shoulder lesions, left shoulder: Secondary | ICD-10-CM | POA: Diagnosis not present

## 2020-01-01 DIAGNOSIS — M7582 Other shoulder lesions, left shoulder: Secondary | ICD-10-CM | POA: Diagnosis not present

## 2020-01-01 DIAGNOSIS — M25612 Stiffness of left shoulder, not elsewhere classified: Secondary | ICD-10-CM | POA: Diagnosis not present

## 2020-01-01 DIAGNOSIS — M25512 Pain in left shoulder: Secondary | ICD-10-CM | POA: Diagnosis not present

## 2020-01-08 DIAGNOSIS — M25512 Pain in left shoulder: Secondary | ICD-10-CM | POA: Diagnosis not present

## 2020-01-08 DIAGNOSIS — M25612 Stiffness of left shoulder, not elsewhere classified: Secondary | ICD-10-CM | POA: Diagnosis not present

## 2020-01-08 DIAGNOSIS — M7582 Other shoulder lesions, left shoulder: Secondary | ICD-10-CM | POA: Diagnosis not present

## 2020-01-16 DIAGNOSIS — H1033 Unspecified acute conjunctivitis, bilateral: Secondary | ICD-10-CM | POA: Diagnosis not present

## 2020-01-18 DIAGNOSIS — M25612 Stiffness of left shoulder, not elsewhere classified: Secondary | ICD-10-CM | POA: Diagnosis not present

## 2020-01-18 DIAGNOSIS — M25512 Pain in left shoulder: Secondary | ICD-10-CM | POA: Diagnosis not present

## 2020-01-18 DIAGNOSIS — M7582 Other shoulder lesions, left shoulder: Secondary | ICD-10-CM | POA: Diagnosis not present

## 2020-01-22 DIAGNOSIS — M7582 Other shoulder lesions, left shoulder: Secondary | ICD-10-CM | POA: Diagnosis not present

## 2020-01-22 DIAGNOSIS — M25512 Pain in left shoulder: Secondary | ICD-10-CM | POA: Diagnosis not present

## 2020-01-22 DIAGNOSIS — M25612 Stiffness of left shoulder, not elsewhere classified: Secondary | ICD-10-CM | POA: Diagnosis not present

## 2020-01-24 DIAGNOSIS — M65812 Other synovitis and tenosynovitis, left shoulder: Secondary | ICD-10-CM | POA: Diagnosis not present

## 2020-01-24 DIAGNOSIS — M7542 Impingement syndrome of left shoulder: Secondary | ICD-10-CM | POA: Diagnosis not present

## 2020-01-24 DIAGNOSIS — S46212D Strain of muscle, fascia and tendon of other parts of biceps, left arm, subsequent encounter: Secondary | ICD-10-CM | POA: Diagnosis not present

## 2020-01-24 DIAGNOSIS — W1849XD Other slipping, tripping and stumbling without falling, subsequent encounter: Secondary | ICD-10-CM | POA: Diagnosis not present

## 2020-01-24 DIAGNOSIS — S46012D Strain of muscle(s) and tendon(s) of the rotator cuff of left shoulder, subsequent encounter: Secondary | ICD-10-CM | POA: Diagnosis not present

## 2020-02-21 DIAGNOSIS — M7582 Other shoulder lesions, left shoulder: Secondary | ICD-10-CM | POA: Diagnosis not present

## 2020-02-21 DIAGNOSIS — M25512 Pain in left shoulder: Secondary | ICD-10-CM | POA: Diagnosis not present

## 2020-02-21 DIAGNOSIS — M25612 Stiffness of left shoulder, not elsewhere classified: Secondary | ICD-10-CM | POA: Diagnosis not present

## 2020-03-06 ENCOUNTER — Other Ambulatory Visit: Payer: Self-pay | Admitting: Family Medicine

## 2020-03-06 DIAGNOSIS — K219 Gastro-esophageal reflux disease without esophagitis: Secondary | ICD-10-CM

## 2020-03-06 NOTE — Telephone Encounter (Signed)
Requested Prescriptions  Pending Prescriptions Disp Refills  . pantoprazole (PROTONIX) 40 MG tablet [Pharmacy Med Name: PANTOPRAZOLE SOD DR 40 MG TAB] 90 tablet 2    Sig: TAKE 1 TABLET BY MOUTH EVERY DAY     Gastroenterology: Proton Pump Inhibitors Passed - 03/06/2020  1:24 AM      Passed - Valid encounter within last 12 months    Recent Outpatient Visits          4 months ago Acute maxillary sinusitis, recurrence not specified   [facility] Juline Patch, MD   11 months ago Fall, initial encounter   [facility] Juline Patch, MD   1 year ago Herpes zoster without complication   [facility] Juline Patch, MD   2 years ago Gastroesophageal reflux disease without esophagitis   [facility] Juline Patch, MD   2 years ago Annual physical exam   [facility] Juline Patch, MD

## 2020-04-30 DIAGNOSIS — Z20822 Contact with and (suspected) exposure to covid-19: Secondary | ICD-10-CM | POA: Diagnosis not present

## 2020-05-29 ENCOUNTER — Ambulatory Visit (INDEPENDENT_AMBULATORY_CARE_PROVIDER_SITE_OTHER): Payer: Medicare Other

## 2020-05-29 DIAGNOSIS — Z0000 Encounter for general adult medical examination without abnormal findings: Secondary | ICD-10-CM | POA: Diagnosis not present

## 2020-05-29 DIAGNOSIS — Z780 Asymptomatic menopausal state: Secondary | ICD-10-CM | POA: Diagnosis not present

## 2020-05-29 DIAGNOSIS — Z1231 Encounter for screening mammogram for malignant neoplasm of breast: Secondary | ICD-10-CM

## 2020-05-29 NOTE — Patient Instructions (Signed)
Jennifer Kirby , Thank you for taking time to come for your Medicare Wellness Visit. I appreciate your ongoing commitment to your health goals. Please review the following plan we discussed and let me know if I can assist you in the future.   Screening recommendations/referrals: Colonoscopy: done 11/12/17.  Mammogram: done 03/27/19. Please call [phone] to schedule your mammogram and bone density screening Bone Density: done 07/24/10 Recommended yearly ophthalmology/optometry visit for glaucoma screening and checkup Recommended yearly dental visit for hygiene and checkup  Vaccinations: Influenza vaccine: due Pneumococcal vaccine: due Tdap vaccine: done 05/29/14 Shingles vaccine: done 01/26/18 & 04/04/18   Covid-19:done 11/13/19 & 12/04/19   Advanced directives: Please bring a copy of your health care power of attorney and living will to the office at your convenience.  Conditions/risks identified: Keep up the great work!  Next appointment: Follow up in one year for your annual wellness visit    Preventive Care 65 Years and Older, Female Preventive care refers to lifestyle choices and visits with your health care provider that can promote health and wellness. What does preventive care include?  A yearly physical exam. This is also called an annual well check.  Dental exams once or twice a year.  Routine eye exams. Ask your health care provider how often you should have your eyes checked.  Personal lifestyle choices, including:  Daily care of your teeth and gums.  Regular physical activity.  Eating a healthy diet.  Avoiding tobacco and drug use.  Limiting alcohol use.  Practicing safe sex.  Taking low-dose aspirin every day.  Taking vitamin and mineral supplements as recommended by your health care provider. What happens during an annual well check? The services and screenings done by your health care provider during your annual well check will depend on your age, overall  health, lifestyle risk factors, and family history of disease. Counseling  Your health care provider may ask you questions about your:  Alcohol use.  Tobacco use.  Drug use.  Emotional well-being.  Home and relationship well-being.  Sexual activity.  Eating habits.  History of falls.  Memory and ability to understand (cognition).  Work and work Statistician.  Reproductive health. Screening  You may have the following tests or measurements:  Height, weight, and BMI.  Blood pressure.  Lipid and cholesterol levels. These may be checked every 5 years, or more frequently if you are over 90 years old.  Skin check.  Lung cancer screening. You may have this screening every year starting at age 91 if you have a 30-pack-year history of smoking and currently smoke or have quit within the past 15 years.  Fecal occult blood test (FOBT) of the stool. You may have this test every year starting at age 72.  Flexible sigmoidoscopy or colonoscopy. You may have a sigmoidoscopy every 5 years or a colonoscopy every 10 years starting at age 68.  Hepatitis C blood test.  Hepatitis B blood test.  Sexually transmitted disease (STD) testing.  Diabetes screening. This is done by checking your blood sugar (glucose) after you have not eaten for a while (fasting). You may have this done every 1-3 years.  Bone density scan. This is done to screen for osteoporosis. You may have this done starting at age 12.  Mammogram. This may be done every 1-2 years. Talk to your health care provider about how often you should have regular mammograms. Talk with your health care provider about your test results, treatment options, and if necessary, the need for  more tests. Vaccines  Your health care provider may recommend certain vaccines, such as:  Influenza vaccine. This is recommended every year.  Tetanus, diphtheria, and acellular pertussis (Tdap, Td) vaccine. You may need a Td booster every 10  years.  Zoster vaccine. You may need this after age 34.  Pneumococcal 13-valent conjugate (PCV13) vaccine. One dose is recommended after age 1.  Pneumococcal polysaccharide (PPSV23) vaccine. One dose is recommended after age 11. Talk to your health care provider about which screenings and vaccines you need and how often you need them. This information is not intended to replace advice given to you by your health care provider. Make sure you discuss any questions you have with your health care provider. Document Released: 10/11/2015 Document Revised: 06/03/2016 Document Reviewed: 07/16/2015 Elsevier Interactive Patient Education  2017 [employer] Prevention in the Home Falls can cause injuries. They can happen to people of all ages. There are many things you can do to make your home safe and to help prevent falls. What can I do on the outside of my home?  Regularly fix the edges of walkways and driveways and fix any cracks.  Remove anything that might make you trip as you walk through a door, such as a raised step or threshold.  Trim any bushes or trees on the path to your home.  Use bright outdoor lighting.  Clear any walking paths of anything that might make someone trip, such as rocks or tools.  Regularly check to see if handrails are loose or broken. Make sure that both sides of any steps have handrails.  Any raised decks and porches should have guardrails on the edges.  Have any leaves, snow, or ice cleared regularly.  Use sand or salt on walking paths during winter.  Clean up any spills in your garage right away. This includes oil or grease spills. What can I do in the bathroom?  Use night lights.  Install grab bars by the toilet and in the tub and shower. Do not use towel bars as grab bars.  Use non-skid mats or decals in the tub or shower.  If you need to sit down in the shower, use a plastic, non-slip stool.  Keep the floor dry. Clean up any water that  spills on the floor as soon as it happens.  Remove soap buildup in the tub or shower regularly.  Attach bath mats securely with double-sided non-slip rug tape.  Do not have throw rugs and other things on the floor that can make you trip. What can I do in the bedroom?  Use night lights.  Make sure that you have a light by your bed that is easy to reach.  Do not use any sheets or blankets that are too big for your bed. They should not hang down onto the floor.  Have a firm chair that has side arms. You can use this for support while you get dressed.  Do not have throw rugs and other things on the floor that can make you trip. What can I do in the kitchen?  Clean up any spills right away.  Avoid walking on wet floors.  Keep items that you use a lot in easy-to-reach places.  If you need to reach something above you, use a strong step stool that has a grab bar.  Keep electrical cords out of the way.  Do not use floor polish or wax that makes floors slippery. If you must use wax, use non-skid  floor wax.  Do not have throw rugs and other things on the floor that can make you trip. What can I do with my stairs?  Do not leave any items on the stairs.  Make sure that there are handrails on both sides of the stairs and use them. Fix handrails that are broken or loose. Make sure that handrails are as long as the stairways.  Check any carpeting to make sure that it is firmly attached to the stairs. Fix any carpet that is loose or worn.  Avoid having throw rugs at the top or bottom of the stairs. If you do have throw rugs, attach them to the floor with carpet tape.  Make sure that you have a light switch at the top of the stairs and the bottom of the stairs. If you do not have them, ask someone to add them for you. What else can I do to help prevent falls?  Wear shoes that:  Do not have high heels.  Have rubber bottoms.  Are comfortable and fit you well.  Are closed at the  toe. Do not wear sandals.  If you use a stepladder:  Make sure that it is fully opened. Do not climb a closed stepladder.  Make sure that both sides of the stepladder are locked into place.  Ask someone to hold it for you, if possible.  Clearly mark and make sure that you can see:  Any grab bars or handrails.  First and last steps.  Where the edge of each step is.  Use tools that help you move around (mobility aids) if they are needed. These include:  Canes.  Walkers.  Scooters.  Crutches.  Turn on the lights when you go into a dark area. Replace any light bulbs as soon as they burn out.  Set up your furniture so you have a clear path. Avoid moving your furniture around.  If any of your floors are uneven, fix them.  If there are any pets around you, be aware of where they are.  Review your medicines with your doctor. Some medicines can make you feel dizzy. This can increase your chance of falling. Ask your doctor what other things that you can do to help prevent falls. This information is not intended to replace advice given to you by your health care provider. Make sure you discuss any questions you have with your health care provider. Document Released: 07/11/2009 Document Revised: 02/20/2016 Document Reviewed: 10/19/2014 Elsevier Interactive Patient Education  2017 [employer].

## 2020-05-29 NOTE — Progress Notes (Signed)
Subjective:   Jennifer Kirby is a 64 y.o. female who presents for an Initial Medicare Annual Wellness Visit.   Virtual Visit via Telephone Note  I connected with  [place] on 05/29/20 at  8:00 AM EDT by telephone and verified that I am speaking with the correct person using two identifiers.  Medicare Annual Wellness visit completed telephonically due to Covid-19 pandemic.   Location: Patient: home Provider: [facility]   I discussed the limitations, risks, security and privacy concerns of performing an evaluation and management service by telephone and the availability of in person appointments. The patient expressed understanding and agreed to proceed.  Unable to perform video visit due to video visit attempted and failed and/or patient does not have video capability.   Some vital signs may be absent or patient reported.   Clemetine Marker, LPN    Review of Systems     Cardiac Risk Factors include: advanced age (>6men, >50 women)     Objective:    There were no vitals filed for this visit. There is no height or weight on file to calculate BMI.  Advanced Directives 05/29/2020 05/12/2018  Does Patient Have a Medical Advance Directive? Yes Yes  Type of Paramedic of [place];Living will Living will  Copy of [place] in Chart? No - copy requested -  Would patient like information on creating a medical advance directive? - Yes (ED - Information included in AVS)    Current Medications (verified) Outpatient Encounter Medications as of 05/29/2020  Medication Sig  . pantoprazole (PROTONIX) 40 MG tablet TAKE 1 TABLET BY MOUTH EVERY DAY  . Sennosides-Docusate Sodium (SENNA PLUS) 8.6-50 MG CAPS Take 2 capsules by mouth daily.  . montelukast (SINGULAIR) 10 MG tablet Take 1 tablet (10 mg total) by mouth at bedtime. (Patient not taking: Reported on 10/31/2019)  . triamcinolone cream (KENALOG) 0.1 % Apply 1 application topically 2  (two) times daily. (Patient not taking: Reported on 05/29/2020)  . [DISCONTINUED] amoxicillin-clavulanate (AUGMENTIN) 875-125 MG tablet Take 1 tablet by mouth 2 (two) times daily.  . [DISCONTINUED] guaiFENesin-codeine (ROBITUSSIN AC) 100-10 MG/5ML syrup Take 5 mLs by mouth 4 (four) times daily as needed for cough.   No facility-administered encounter medications on file as of 05/29/2020.    Allergies (verified) Statins and Zetia [ezetimibe]   History: Past Medical History:  Diagnosis Date  . Allergy   . Hyperlipidemia    Past Surgical History:  Procedure Laterality Date  . FOOT SURGERY Left    plantar fasciatis and broken foot  . ROTATOR CUFF REPAIR Left 05/26/2019   bicep tear, tendon repair and skin graft - [facility]    Family History  Problem Relation Age of Onset  . Throat cancer Father   . Breast cancer Neg Hx    Social History   Socioeconomic History  . Marital status: Widowed    Spouse name: Not on file  . Number of children: Not on file  . Years of education: Not on file  . Highest education level: Not on file  Occupational History  . Occupation: retired  Tobacco Use  . Smoking status: Never Smoker  . Smokeless tobacco: Never Used  Vaping Use  . Vaping Use: Never used  Substance and Sexual Activity  . Alcohol use: Not Currently  . Drug use: No  . Sexual activity: Never  Other Topics Concern  . Not on file  Social History Narrative   Pt lives alone. 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    Social Determinants of Health   Financial Resource Strain: Low Risk   . Difficulty of Paying Living Expenses: Not hard at all  Food Insecurity: No Food Insecurity  . Worried About Charity fundraiser in the Last Year: Never true  . Ran Out of Food in the Last Year: Never true  Transportation Needs: No Transportation Needs  . Lack of Transportation (Medical): No  . Lack of Transportation (Non-Medical): No  Physical Activity: Sufficiently Active  . Days of Exercise per Week: 2 days   . Minutes of Exercise per Session: 120 min  Stress: No Stress Concern Present  . Feeling of Stress : Not at all  Social Connections: Moderately Integrated  . Frequency of Communication with Friends and Family: More than three times a week  . Frequency of Social Gatherings with Friends and Family: More than three times a week  . Attends Religious Services: More than 4 times per year  . Active Member of Clubs or Organizations: Yes  . Attends Archivist Meetings: More than 4 times per year  . Marital Status: Widowed    Tobacco Counseling Counseling given: Not Answered   Clinical Intake:  Pre-visit preparation completed: Yes  Pain : No/denies pain     Nutritional Risks: None Diabetes: No  How often do you need to have someone help you when you read instructions, pamphlets, or other written materials from your doctor or pharmacy?: 1 - Never    Interpreter Needed?: No  Information entered by :: Clemetine Marker LPN   Activities of Daily Living In your present state of health, do you have any difficulty performing the following activities: 05/29/2020  Hearing? Y  Comment declines hearing aids  Vision? N  Difficulty concentrating or making decisions? N  Walking or climbing stairs? N  Dressing or bathing? N  Doing errands, shopping? N  Preparing Food and eating ? N  Using the Toilet? N  In the past six months, have you accidently leaked urine? N  Do you have problems with loss of bowel control? N  Managing your Medications? N  Managing your Finances? N  Housekeeping or managing your Housekeeping? N  Some recent data might be hidden    Patient Care Team: Juline Patch, MD as PCP - General (Family Medicine) Zara Council as Physician Assistant (Orthopedic Surgery) Robert Bellow, MD (General Surgery)  Indicate any recent Medical Services you may have received from other than [facility] providers in the past year (date may be approximate).       Assessment:   This is a routine wellness examination for Jennifer.  Hearing/Vision screen  Hearing Screening   125Hz  250Hz  500Hz  1000Hz  2000Hz  3000Hz  4000Hz  6000Hz  8000Hz   Right ear:           Left ear:           Comments: Pt states mild hearing loss in right ear, declines hearing aids  Vision Screening Comments: Annual vision screenings with Dr. Juanda Crumble in [place]  Dietary issues and exercise activities discussed: Current Exercise Habits: Structured exercise class, Type of exercise: Other - see comments (water aerobics), Time (Minutes): > 60, Frequency (Times/Week): 2, Weekly Exercise (Minutes/Week): 0, Intensity: Moderate, Exercise limited by: orthopedic condition(s)  Goals    . Weight (lb) < 150 lb (68 kg)     Pt states she would like to lose 25 lbs over the next year with healthy eating and physical activity  Depression Screen PHQ 2/9 Scores 05/29/2020 10/31/2019 07/06/2017 12/30/2015  PHQ - 2 Score 0 0 0 2  PHQ- 9 Score - 0 0 -    Fall Risk Fall Risk  05/29/2020 10/31/2019 12/30/2015  Falls in the past year? 0 0 No  Number falls in past yr: 0 - -  Injury with Fall? 0 - -  Risk for fall due to : No Fall Risks - -  Follow up Falls prevention discussed Falls evaluation completed -    Any stairs in or around the home? Yes  If so, are there any without handrails? No  Home free of loose throw rugs in walkways, pet beds, electrical cords, etc? Yes  Adequate lighting in your home to reduce risk of falls? Yes   ASSISTIVE DEVICES UTILIZED TO PREVENT FALLS:  Life alert? No  Use of a cane, walker or w/c? No  Grab bars in the bathroom? Yes  Shower chair or bench in shower? No  Elevated toilet seat or a handicapped toilet? No   TIMED UP AND GO:  Was the test performed? No . Telephonic visit.   Cognitive Function: 6CIT deferred for 2021 AWV; pt states no memory issues        Immunizations Immunization History  Administered Date(s) Administered  . Influenza Inj Mdck Quad  Pf 06/22/2018  . Influenza, High Dose Seasonal PF 05/10/2019  . Influenza,inj,Quad PF,6+ Mos 08/14/2015, 07/15/2016, 07/06/2017  . Influenza-Unspecified 04/29/2019  . PFIZER SARS-COV-2 Vaccination 11/13/2019, 12/04/2019  . Zoster Recombinat (Shingrix) 01/26/2018, 04/04/2018    TDAP status: Up to date   Flu Vaccine status: Declined, Education has been provided regarding the importance of this vaccine but patient still declined. Advised may receive this vaccine at local pharmacy or Health Dept. Aware to provide a copy of the vaccination record if obtained from local pharmacy or Health Dept. Verbalized acceptance and understanding.   Pneumococcal vaccine status: Declined,  Education has been provided regarding the importance of this vaccine but patient still declined. Advised may receive this vaccine at local pharmacy or Health Dept. Aware to provide a copy of the vaccination record if obtained from local pharmacy or Health Dept. Verbalized acceptance and understanding.    Covid-19 vaccine status: Completed vaccines  Qualifies for Shingles Vaccine? Yes   Zostavax completed Yes   Shingrix Completed?: Yes  Screening Tests Health Maintenance  Topic Date Due  . Hepatitis C Screening  Never done  . PNA vac Low Risk Adult (1 of 2 - PCV13) Never done  . INFLUENZA VACCINE  04/28/2020  . MAMMOGRAM  03/26/2021  . TETANUS/TDAP  09/28/2026  . COLONOSCOPY  11/13/2027  . DEXA SCAN  Completed  . COVID-19 Vaccine  Completed    Health Maintenance  Health Maintenance Due  Topic Date Due  . Hepatitis C Screening  Never done  . PNA vac Low Risk Adult (1 of 2 - PCV13) Never done  . INFLUENZA VACCINE  04/28/2020    Colorectal cancer screening: Completed 11/12/17. Repeat every 10 years   Mammogram status: Completed 03/27/19. Repeat every year   Bone Density status: Completed 07/16/10. Results reflect: Bone density results: OSTEOPENIA. Repeat every 2 years.  Lung Cancer Screening: (Low Dose CT  Chest recommended if Age 54-80 years, 30 pack-year currently smoking OR have quit w/in 15years.) does not qualify.   Additional Screening:  Hepatitis C Screening: does qualify; postponed  Vision Screening: Recommended annual ophthalmology exams for early detection of glaucoma and other disorders of the eye. Is the patient  up to date with their annual eye exam?  Yes  Who is the provider or what is the name of the office in which the patient attends annual eye exams? Dr. Mallie Mussel  Dental Screening: Recommended annual dental exams for proper oral hygiene  Community Resource Referral / Chronic Care Management: CRR required this visit?  No   CCM required this visit?  No      Plan:     I have personally reviewed and noted the following in the patient's chart:   . Medical and social history . Use of alcohol, tobacco or illicit drugs  . Current medications and supplements . Functional ability and status . Nutritional status . Physical activity . Advanced directives . List of other physicians . Hospitalizations, surgeries, and ER visits in previous 12 months . Vitals . Screenings to include cognitive, depression, and falls . Referrals and appointments  In addition, I have reviewed and discussed with patient certain preventive protocols, quality metrics, and best practice recommendations. A written personalized care plan for preventive services as well as general preventive health recommendations were provided to patient.     Clemetine Marker, LPN   0/05/2329   Nurse Notes: pt advised due for OV for labs and med refill. Scheduled pt for 06/04/20 with Dr. Ronnald Ramp. Pt also plans to receive flu and pneumonia vaccine at that appt.

## 2020-05-29 NOTE — Addendum Note (Signed)
Addended by: Clemetine Marker D on: 05/29/2020 10:27 AM   Modules accepted: Orders

## 2020-06-04 ENCOUNTER — Ambulatory Visit: Payer: Medicare Other | Admitting: Family Medicine

## 2020-06-13 ENCOUNTER — Ambulatory Visit
Admission: RE | Admit: 2020-06-13 | Discharge: 2020-06-13 | Disposition: A | Discharge status: Home / Self Care | Payer: Medicare Other | Source: Ambulatory Visit | Attending: Family Medicine | Admitting: Family Medicine

## 2020-06-13 ENCOUNTER — Other Ambulatory Visit: Payer: Self-pay

## 2020-06-13 DIAGNOSIS — Z1231 Encounter for screening mammogram for malignant neoplasm of breast: Secondary | ICD-10-CM | POA: Insufficient documentation

## 2020-06-13 DIAGNOSIS — Z780 Asymptomatic menopausal state: Secondary | ICD-10-CM | POA: Diagnosis not present

## 2020-06-13 DIAGNOSIS — M85852 Other specified disorders of bone density and structure, left thigh: Secondary | ICD-10-CM | POA: Diagnosis not present

## 2020-06-13 DIAGNOSIS — R29890 Loss of height: Secondary | ICD-10-CM | POA: Diagnosis not present

## 2020-06-14 ENCOUNTER — Telehealth: Payer: Self-pay

## 2020-06-14 NOTE — Telephone Encounter (Signed)
Called with normal mammo

## 2020-06-20 ENCOUNTER — Other Ambulatory Visit: Payer: Self-pay

## 2020-06-20 ENCOUNTER — Ambulatory Visit (INDEPENDENT_AMBULATORY_CARE_PROVIDER_SITE_OTHER): Payer: Medicare Other | Admitting: Family Medicine

## 2020-06-20 ENCOUNTER — Encounter: Payer: Self-pay | Admitting: Family Medicine

## 2020-06-20 VITALS — BP 130/70 | HR 64 | Ht 59.0 in | Wt 139.0 lb

## 2020-06-20 DIAGNOSIS — M5136 Other intervertebral disc degeneration, lumbar region: Secondary | ICD-10-CM | POA: Diagnosis not present

## 2020-06-20 DIAGNOSIS — E782 Mixed hyperlipidemia: Secondary | ICD-10-CM

## 2020-06-20 DIAGNOSIS — J301 Allergic rhinitis due to pollen: Secondary | ICD-10-CM

## 2020-06-20 DIAGNOSIS — K219 Gastro-esophageal reflux disease without esophagitis: Secondary | ICD-10-CM | POA: Diagnosis not present

## 2020-06-20 DIAGNOSIS — Z23 Encounter for immunization: Secondary | ICD-10-CM | POA: Diagnosis not present

## 2020-06-20 MED ORDER — MONTELUKAST SODIUM 10 MG PO TABS: 10.0000 mg | ORAL_TABLET | Freq: Every day | ORAL | 3 refills | Status: DC

## 2020-06-20 MED ORDER — PREDNISONE 10 MG PO TABS: 10.0000 mg | ORAL_TABLET | Freq: Every day | ORAL | 0 refills | Status: DC

## 2020-06-20 MED ORDER — PANTOPRAZOLE SODIUM 40 MG PO TBEC: 40.0000 mg | DELAYED_RELEASE_TABLET | Freq: Every day | ORAL | 3 refills | Status: DC

## 2020-06-20 MED ORDER — MELOXICAM 15 MG PO TABS: 15.0000 mg | ORAL_TABLET | Freq: Every day | ORAL | 0 refills | Status: DC

## 2020-06-20 MED ORDER — CYCLOBENZAPRINE HCL 10 MG PO TABS: 10.0000 mg | ORAL_TABLET | Freq: Three times a day (TID) | ORAL | 0 refills | Status: DC | PRN

## 2020-06-20 NOTE — Progress Notes (Addendum)
Date:  06/20/2020   Name:  Jennifer Kirby   DOB:  October 12, 1955   MRN:  [id number]   Chief Complaint: Allergic Rhinitis , Gastroesophageal Reflux, Back Pain (lower back pain), and Flu Vaccine  Gastroesophageal Reflux She reports no abdominal pain, no belching, no chest pain, no choking, no coughing, no dysphagia, no early satiety, no globus sensation, no heartburn, no hoarse voice, no nausea, no sore throat, no stridor, no tooth decay, no water brash or no wheezing. This is a chronic problem. The current episode started more than 1 year ago. The problem occurs occasionally. The problem has been gradually improving. The symptoms are aggravated by certain foods. Pertinent negatives include no anemia, fatigue, melena, muscle weakness, orthopnea or weight loss. She has tried a PPI for the symptoms. The treatment provided moderate relief.  Back Pain This is a chronic problem. The problem occurs intermittently. The problem has been waxing and waning since onset. The pain is present in the lumbar spine. The quality of the pain is described as aching. The pain is moderate. Pertinent negatives include no abdominal pain, bladder incontinence, bowel incontinence, chest pain, dysuria, fever, headaches, numbness, paresis, paresthesias, pelvic pain, weakness or weight loss. She has tried NSAIDs (ibuprofen) for the symptoms. The treatment provided moderate relief.  URI  This is a chronic problem. The current episode started more than 1 year ago. The problem has been waxing and waning. Pertinent negatives include no abdominal pain, chest pain, congestion, coughing, diarrhea, dysuria, ear pain, headaches, joint pain, joint swelling, nausea, neck pain, plugged ear sensation, rash, rhinorrhea, sinus pain, sneezing, sore throat, swollen glands, vomiting or wheezing. The treatment provided moderate relief.    Lab Results  Component Value Date   CREATININE 0.54 (L) 07/28/2017   BUN 8 07/28/2017   NA 141  07/28/2017   K 4.2 07/28/2017   CL 101 07/28/2017   CO2 24 07/28/2017   Lab Results  Component Value Date   CHOL 260 (H) 07/28/2017   HDL 75 07/28/2017   LDLCALC 166 (H) 07/28/2017   TRIG 96 07/28/2017   CHOLHDL 3.5 07/28/2017   No results found for: TSH No results found for: HGBA1C Lab Results  Component Value Date   WBC 6.9 07/28/2017   HGB 14.4 07/28/2017   HCT 41.9 07/28/2017   MCV 86 07/28/2017   PLT 263 07/28/2017   No results found for: ALT, AST, GGT, ALKPHOS, BILITOT   Review of Systems  Constitutional: Negative.  Negative for chills, fatigue, fever, unexpected weight change and weight loss.  HENT: Negative for congestion, ear discharge, ear pain, hoarse voice, rhinorrhea, sinus pressure, sinus pain, sneezing and sore throat.   Eyes: Negative for photophobia, pain, discharge, redness and itching.  Respiratory: Negative for cough, choking, shortness of breath, wheezing and stridor.   Cardiovascular: Negative for chest pain.  Gastrointestinal: Negative for abdominal pain, blood in stool, bowel incontinence, constipation, diarrhea, dysphagia, heartburn, melena, nausea and vomiting.  Endocrine: Negative for cold intolerance, heat intolerance, polydipsia, polyphagia and polyuria.  Genitourinary: Negative for bladder incontinence, dysuria, flank pain, frequency, hematuria, menstrual problem, pelvic pain, urgency, vaginal bleeding and vaginal discharge.  Musculoskeletal: Positive for back pain. Negative for arthralgias, joint pain, myalgias, muscle weakness and neck pain.  Skin: Negative for rash.  Allergic/Immunologic: Negative for environmental allergies and food allergies.  Neurological: Negative for dizziness, weakness, light-headedness, numbness, headaches and paresthesias.  Hematological: Negative for adenopathy. Does not bruise/bleed easily.  Psychiatric/Behavioral: Negative for dysphoric mood. The patient is not  nervous/anxious.     Patient Active Problem List    Diagnosis Date Noted  . Obesity (BMI 30-39.9) 11/25/2018  . Contact dermatitis 02/15/2018  . Mass of skin of right shoulder 07/27/2016  . Brachial neuritis 03/08/2015  . Encounter for general adult medical examination without abnormal findings 03/08/2015  . Herpes zoster 03/08/2015  . H/O hypercholesterolemia 03/08/2015  . HLD (hyperlipidemia) 03/08/2015    Allergies  Allergen Reactions  . Statins   . Zetia [Ezetimibe] Other (See Comments)    Gave leg cramps    Past Surgical History:  Procedure Laterality Date  . FOOT SURGERY Left    plantar fasciatis and broken foot  . ROTATOR CUFF REPAIR Left 05/26/2019   bicep tear, tendon repair and skin graft - [facility]     Social History   Tobacco Use  . Smoking status: Never Smoker  . Smokeless tobacco: Never Used  Vaping Use  . Vaping Use: Never used  Substance Use Topics  . Alcohol use: Not Currently  . Drug use: No     Medication list has been reviewed and updated.  Current Meds  Medication Sig  . montelukast (SINGULAIR) 10 MG tablet Take 1 tablet (10 mg total) by mouth at bedtime.  . pantoprazole (PROTONIX) 40 MG tablet TAKE 1 TABLET BY MOUTH EVERY DAY  . Sennosides-Docusate Sodium (SENNA PLUS) 8.6-50 MG CAPS Take 2 capsules by mouth daily.  Marland Kitchen triamcinolone cream (KENALOG) 0.1 % Apply 1 application topically 2 (two) times daily.    PHQ 2/9 Scores 06/20/2020 05/29/2020 10/31/2019 07/06/2017  PHQ - 2 Score 0 0 0 0  PHQ- 9 Score 0 - 0 0    GAD 7 : Generalized Anxiety Score 06/20/2020  Nervous, Anxious, on Edge 0  Control/stop worrying 0  Worry too much - different things 0  Trouble relaxing 0  Restless 0  Easily annoyed or irritable 0  Afraid - awful might happen 0  Total GAD 7 Score 0    BP Readings from Last 3 Encounters:  06/20/20 130/70  03/20/19 110/64  11/25/18 140/90    Physical Exam Vitals and nursing note reviewed.  Constitutional:      General: She is not in acute distress.    Appearance: She is not  diaphoretic.  HENT:     Head: Normocephalic and atraumatic.     Right Ear: Tympanic membrane, ear canal and external ear normal. There is no impacted cerumen.     Left Ear: Tympanic membrane, ear canal and external ear normal. There is no impacted cerumen.     Nose: Nose normal. No congestion or rhinorrhea.     Mouth/Throat:     Mouth: Mucous membranes are moist.  Eyes:     General:        Right eye: No discharge.        Left eye: No discharge.     Conjunctiva/sclera: Conjunctivae normal.     Pupils: Pupils are equal, round, and reactive to light.  Neck:     Thyroid: No thyromegaly.     Vascular: No carotid bruit or JVD.  Cardiovascular:     Rate and Rhythm: Normal rate and regular rhythm.     Heart sounds: Normal heart sounds. No murmur heard.  No friction rub. No gallop.   Pulmonary:     Effort: Pulmonary effort is normal.     Breath sounds: Normal breath sounds. No wheezing, rhonchi or rales.  Chest:     Chest wall: No tenderness.  Abdominal:  General: Bowel sounds are normal.     Palpations: Abdomen is soft. There is no mass.     Tenderness: There is no abdominal tenderness. There is no guarding or rebound.  Musculoskeletal:        General: Normal range of motion.     Cervical back: Normal range of motion and neck supple.     Lumbar back: Spasms present. Scoliosis present.  Lymphadenopathy:     Cervical: No cervical adenopathy.  Skin:    General: Skin is warm and dry.     Capillary Refill: Capillary refill takes less than 2 seconds.     Findings: No bruising, erythema, lesion or rash.  Neurological:     General: No focal deficit present.     Mental Status: She is alert.     Deep Tendon Reflexes: Reflexes are normal and symmetric.     Wt Readings from Last 3 Encounters:  06/20/20 139 lb (63 kg)  05/19/19 139 lb (63 kg)  03/20/19 139 lb (63 kg)    BP 130/70   Pulse 64   Ht 4\' 11"  (1.499 m)   Wt 139 lb (63 kg)   BMI 28.07 kg/m    Assessment and  Plan: 1. Gastroesophageal reflux disease Chronic.  Controlled.  Stable.  Continue pantoprazole 40 mg once a day. - pantoprazole (PROTONIX) 40 MG tablet; Take 1 tablet (40 mg total) by mouth daily.  Dispense: 90 tablet; Refill: 3  2. Seasonal allergic rhinitis due to pollen Chronic.  Controlled.  Stable.  Continue Singulair 10 mg once a day. - montelukast (SINGULAIR) 10 MG tablet; Take 1 tablet (10 mg total) by mouth at bedtime.  Dispense: 90 tablet; Refill: 3  3. Lumbar degenerative disc disease New onset.  Intermittent.  Episodic.  Mild to moderate.  Patient was noted to have a x-ray in 2018 which noted degenerative changes in the disc and arthritis in the facets.  Patient has been instructed to limit her activity with this current exacerbation.  Patient has been given meloxicam 15 mg once a day for 2 weeks cyclobenzaprine 10 mg to be taken 1 every evening.  And prednisone 10 mg once a day for 2 weeks. - meloxicam (MOBIC) 15 MG tablet; Take 1 tablet (15 mg total) by mouth daily.  Dispense: 30 tablet; Refill: 0 - cyclobenzaprine (FLEXERIL) 10 MG tablet; Take 1 tablet (10 mg total) by mouth 3 (three) times daily as needed for muscle spasms.  Dispense: 30 tablet; Refill: 0 - predniSONE (DELTASONE) 10 MG tablet; Take 1 tablet (10 mg total) by mouth daily with breakfast.  Dispense: 30 tablet; Refill: 0  4. Mixed hyperlipidemia Chronic.  Controlled.  Stable.  Patient is controlling this currently with diet.  Will check lipid panel. - Lipid Panel With LDL/HDL Ratio - Renal function panel  5. Need for immunization against influenza Gust and administered. - Flu Vaccine QUAD High Dose(Fluad)

## 2020-06-21 LAB — RENAL FUNCTION PANEL
Albumin: 4.3 g/dL (ref 3.8–4.8)
BUN/Creatinine Ratio: 11 — ABNORMAL LOW (ref 12–28)
BUN: 7 mg/dL — ABNORMAL LOW (ref 8–27)
CO2: 21 mmol/L (ref 20–29)
Calcium: 8.9 mg/dL (ref 8.7–10.3)
Chloride: 104 mmol/L (ref 96–106)
Creatinine, Ser: 0.65 mg/dL (ref 0.57–1.00)
GFR calc Af Amer: 107 mL/min/{1.73_m2} (ref >59)
GFR calc non Af Amer: 93 mL/min/{1.73_m2} (ref >59)
Glucose: 89 mg/dL (ref 65–99)
Phosphorus: 4 mg/dL (ref 3.0–4.3)
Potassium: 4.2 mmol/L (ref 3.5–5.2)
Sodium: 142 mmol/L (ref 134–144)

## 2020-06-21 LAB — LIPID PANEL WITH LDL/HDL RATIO
Cholesterol, Total: 233 mg/dL — ABNORMAL HIGH (ref 100–199)
HDL: 67 mg/dL (ref >39)
LDL Chol Calc (NIH): 145 mg/dL — ABNORMAL HIGH (ref 0–99)
LDL/HDL Ratio: 2.2 ratio (ref 0.0–3.2)
Triglycerides: 117 mg/dL (ref 0–149)
VLDL Cholesterol Cal: 21 mg/dL (ref 5–40)

## 2020-06-25 ENCOUNTER — Encounter: Payer: Self-pay | Admitting: Family Medicine

## 2020-06-25 ENCOUNTER — Other Ambulatory Visit: Payer: Self-pay

## 2020-06-25 ENCOUNTER — Ambulatory Visit (INDEPENDENT_AMBULATORY_CARE_PROVIDER_SITE_OTHER): Payer: Medicare Other | Admitting: Family Medicine

## 2020-06-25 ENCOUNTER — Ambulatory Visit: Payer: Self-pay | Admitting: Family Medicine

## 2020-06-25 VITALS — BP 120/64 | HR 64 | Ht 59.0 in | Wt 150.0 lb

## 2020-06-25 DIAGNOSIS — Z111 Encounter for screening for respiratory tuberculosis: Secondary | ICD-10-CM | POA: Diagnosis not present

## 2020-06-25 DIAGNOSIS — Z0289 Encounter for other administrative examinations: Secondary | ICD-10-CM

## 2020-06-25 NOTE — Progress Notes (Signed)
Date:  06/25/2020   Name:  Jennifer Kirby   DOB:  Jan 12, 1956   MRN:  [id number]   Chief Complaint: Employment Physical  Patient is a 64 year old female who presents for an employment  exam. The patient reports the following problems: backpain. Health maintenance has been reviewed up to date.   Lab Results  Component Value Date   CREATININE 0.65 06/20/2020   BUN 7 (L) 06/20/2020   NA 142 06/20/2020   K 4.2 06/20/2020   CL 104 06/20/2020   CO2 21 06/20/2020   Lab Results  Component Value Date   CHOL 233 (H) 06/20/2020   HDL 67 06/20/2020   LDLCALC 145 (H) 06/20/2020   TRIG 117 06/20/2020   CHOLHDL 3.5 07/28/2017   No results found for: TSH No results found for: HGBA1C Lab Results  Component Value Date   WBC 6.9 07/28/2017   HGB 14.4 07/28/2017   HCT 41.9 07/28/2017   MCV 86 07/28/2017   PLT 263 07/28/2017   No results found for: ALT, AST, GGT, ALKPHOS, BILITOT   Review of Systems  Constitutional: Negative.  Negative for chills, fatigue, fever and unexpected weight change.  HENT: Negative for congestion, ear discharge, ear pain, rhinorrhea, sinus pressure, sneezing and sore throat.   Eyes: Negative for photophobia, pain, discharge, redness and itching.  Respiratory: Negative for cough, chest tightness, shortness of breath, wheezing and stridor.   Cardiovascular: Negative for chest pain, palpitations and leg swelling.  Gastrointestinal: Negative for abdominal pain, blood in stool, constipation, diarrhea, nausea and vomiting.  Endocrine: Negative for cold intolerance, heat intolerance, polydipsia, polyphagia and polyuria.  Genitourinary: Negative for dysuria, flank pain, frequency, hematuria, menstrual problem, pelvic pain, urgency, vaginal bleeding and vaginal discharge.  Musculoskeletal: Negative for arthralgias, back pain and myalgias.  Skin: Negative for rash.  Allergic/Immunologic: Negative for environmental allergies and food allergies.  Neurological:  Negative for dizziness, weakness, light-headedness, numbness and headaches.  Hematological: Negative for adenopathy. Does not bruise/bleed easily.  Psychiatric/Behavioral: Negative for dysphoric mood. The patient is not nervous/anxious.     Patient Active Problem List   Diagnosis Date Noted  . Obesity (BMI 30-39.9) 11/25/2018  . Contact dermatitis 02/15/2018  . Mass of skin of right shoulder 07/27/2016  . Brachial neuritis 03/08/2015  . Encounter for general adult medical examination without abnormal findings 03/08/2015  . Herpes zoster 03/08/2015  . H/O hypercholesterolemia 03/08/2015  . HLD (hyperlipidemia) 03/08/2015    Allergies  Allergen Reactions  . Statins   . Zetia [Ezetimibe] Other (See Comments)    Gave leg cramps    Past Surgical History:  Procedure Laterality Date  . FOOT SURGERY Left    plantar fasciatis and broken foot  . ROTATOR CUFF REPAIR Left 05/26/2019   bicep tear, tendon repair and skin graft - [facility]     Social History   Tobacco Use  . Smoking status: Never Smoker  . Smokeless tobacco: Never Used  Vaping Use  . Vaping Use: Never used  Substance Use Topics  . Alcohol use: Not Currently  . Drug use: No     Medication list has been reviewed and updated.  Current Meds  Medication Sig  . cyclobenzaprine (FLEXERIL) 10 MG tablet Take 1 tablet (10 mg total) by mouth 3 (three) times daily as needed for muscle spasms.  . meloxicam (MOBIC) 15 MG tablet Take 1 tablet (15 mg total) by mouth daily.  . montelukast (SINGULAIR) 10 MG tablet Take 1 tablet (10 mg total)  by mouth at bedtime.  . pantoprazole (PROTONIX) 40 MG tablet Take 1 tablet (40 mg total) by mouth daily.  . predniSONE (DELTASONE) 10 MG tablet Take 1 tablet (10 mg total) by mouth daily with breakfast.  . Sennosides-Docusate Sodium (SENNA PLUS) 8.6-50 MG CAPS Take 2 capsules by mouth daily.  Marland Kitchen triamcinolone cream (KENALOG) 0.1 % Apply 1 application topically 2 (two) times daily.    PHQ 2/9  Scores 06/20/2020 05/29/2020 10/31/2019 07/06/2017  PHQ - 2 Score 0 0 0 0  PHQ- 9 Score 0 - 0 0    GAD 7 : Generalized Anxiety Score 06/20/2020  Nervous, Anxious, on Edge 0  Control/stop worrying 0  Worry too much - different things 0  Trouble relaxing 0  Restless 0  Easily annoyed or irritable 0  Afraid - awful might happen 0  Total GAD 7 Score 0    BP Readings from Last 3 Encounters:  06/25/20 120/64  06/20/20 130/70  03/20/19 110/64    Physical Exam Vitals and nursing note reviewed.  Constitutional:      Appearance: She is well-developed.  HENT:     Head: Normocephalic.     Right Ear: Tympanic membrane, ear canal and external ear normal. There is no impacted cerumen.     Left Ear: Tympanic membrane, ear canal and external ear normal. There is no impacted cerumen.     Nose: Nose normal. No congestion or rhinorrhea.     Mouth/Throat:     Mouth: Mucous membranes are moist.  Eyes:     General: Lids are everted, no foreign bodies appreciated. No scleral icterus.       Left eye: No foreign body or hordeolum.     Conjunctiva/sclera: Conjunctivae normal.     Right eye: Right conjunctiva is not injected.     Left eye: Left conjunctiva is not injected.     Pupils: Pupils are equal, round, and reactive to light.  Neck:     Thyroid: No thyromegaly.     Vascular: No JVD.     Trachea: No tracheal deviation.  Cardiovascular:     Rate and Rhythm: Normal rate and regular rhythm.     Heart sounds: Normal heart sounds. No murmur heard.  No friction rub. No gallop.   Pulmonary:     Effort: Pulmonary effort is normal. No respiratory distress.     Breath sounds: Normal breath sounds. No stridor. No wheezing, rhonchi or rales.  Chest:     Chest wall: No tenderness.  Abdominal:     General: Bowel sounds are normal.     Palpations: Abdomen is soft. There is no mass.     Tenderness: There is no abdominal tenderness. There is no guarding or rebound.  Musculoskeletal:        General: No  tenderness. Normal range of motion.     Cervical back: Normal range of motion and neck supple.  Lymphadenopathy:     Cervical: No cervical adenopathy.  Skin:    General: Skin is warm.     Capillary Refill: Capillary refill takes less than 2 seconds.     Findings: No bruising, erythema or rash.  Neurological:     General: No focal deficit present.     Mental Status: She is alert and oriented to person, place, and time.     Cranial Nerves: No cranial nerve deficit.     Sensory: No sensory deficit.     Motor: No weakness.     Coordination: Coordination normal.  Gait: Gait normal.     Deep Tendon Reflexes: Reflexes normal.  Psychiatric:        Mood and Affect: Mood is not anxious or depressed.     Wt Readings from Last 3 Encounters:  06/25/20 150 lb (68 kg)  06/20/20 139 lb (63 kg)  05/19/19 139 lb (63 kg)    BP 120/64   Pulse 64   Ht 4\' 11"  (1.499 m)   Wt 150 lb (68 kg)   BMI 30.30 kg/m   Assessment and Plan: 1. 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  There is no evidence of communicable disease and physical exam notes no contraindication to teaching responsibilities.  2. Screening-pulmonary TB Placement was done and reading will be in 48 hours. - TB Skin Test

## 2020-06-27 LAB — TB SKIN TEST
Induration: 0 mm
TB Skin Test: NEGATIVE

## 2020-07-08 DIAGNOSIS — Z23 Encounter for immunization: Secondary | ICD-10-CM | POA: Diagnosis not present

## 2020-07-13 ENCOUNTER — Other Ambulatory Visit: Payer: Self-pay | Admitting: Family Medicine

## 2020-07-13 DIAGNOSIS — M5136 Other intervertebral disc degeneration, lumbar region: Secondary | ICD-10-CM

## 2020-07-13 NOTE — Telephone Encounter (Signed)
Requested medication (s) are due for refill today: yes  Requested medication (s) are on the active medication list: yes  Last refill:  prednisone: 06/20/20  meloxicam: 06/20/20  Future visit scheduled: no  Notes to clinic:  Prednisone- not delegated to NT to RF Not sure if provider wants to refill or seen in office first for the Meloxicam   Requested Prescriptions  Pending Prescriptions Disp Refills   predniSONE (DELTASONE) 10 MG tablet [Pharmacy Med Name: PREDNISONE 10 MG TABLET] 30 tablet 0    Sig: Take 1 tablet (10 mg total) by mouth daily with breakfast.      Not Delegated - Endocrinology:  Oral Corticosteroids Failed - 07/13/2020  8:49 AM      Failed - This refill cannot be delegated      Passed - Last BP in normal range    BP Readings from Last 1 Encounters:  06/25/20 120/64          Passed - Valid encounter within last 6 months    Recent Outpatient Visits           2 weeks ago Encounter for physical examination related to employment   [facility] Juline Patch, [facility]   3 weeks ago Lumbar degenerative disc disease   [facility] Juline Patch, MD   8 months ago Acute maxillary sinusitis, recurrence not specified   [facility] Juline Patch, MD   1 year ago Fall, initial encounter   [facility] Juline Patch, MD   2 years ago Herpes zoster without complication   [facility] Juline Patch, MD                meloxicam (MOBIC) 15 MG tablet [Pharmacy Med Name: MELOXICAM 15 MG TABLET] 30 tablet 0    Sig: TAKE 1 TABLET BY MOUTH EVERY DAY      Analgesics:  COX2 Inhibitors Failed - 07/13/2020  8:49 AM      Failed - HGB in normal range and within 360 days    Hemoglobin  Date Value Ref Range Status  07/28/2017 14.4 11.1 - 15.9 g/dL Final          Passed - Cr in normal range and within 360 days    Creatinine, Ser  Date Value Ref Range Status  06/20/2020 0.65 0.57 - 1.00 mg/dL Final          Passed -  Patient is not pregnant      Passed - Valid encounter within last 12 months    Recent Outpatient Visits           2 weeks ago Encounter for physical examination related to employment   [facility] Juline Patch, MD   3 weeks ago Lumbar degenerative disc disease   [facility] Juline Patch, MD   8 months ago Acute maxillary sinusitis, recurrence not specified   [facility] Juline Patch, MD   1 year ago Fall, initial encounter   [facility] Juline Patch, MD   2 years ago Herpes zoster without complication   [facility] Juline Patch, MD

## 2020-08-06 ENCOUNTER — Other Ambulatory Visit: Payer: Self-pay

## 2020-08-06 ENCOUNTER — Encounter: Payer: Self-pay | Admitting: Family Medicine

## 2020-08-06 ENCOUNTER — Ambulatory Visit (INDEPENDENT_AMBULATORY_CARE_PROVIDER_SITE_OTHER): Payer: Medicare Other | Admitting: Family Medicine

## 2020-08-06 ENCOUNTER — Telehealth: Payer: Self-pay

## 2020-08-06 VITALS — BP 120/80 | HR 68 | Ht 59.0 in | Wt 151.0 lb

## 2020-08-06 DIAGNOSIS — Z23 Encounter for immunization: Secondary | ICD-10-CM

## 2020-08-06 DIAGNOSIS — M222X2 Patellofemoral disorders, left knee: Secondary | ICD-10-CM | POA: Diagnosis not present

## 2020-08-06 DIAGNOSIS — M222X1 Patellofemoral disorders, right knee: Secondary | ICD-10-CM

## 2020-08-06 DIAGNOSIS — M1711 Unilateral primary osteoarthritis, right knee: Secondary | ICD-10-CM | POA: Diagnosis not present

## 2020-08-06 DIAGNOSIS — M5136 Other intervertebral disc degeneration, lumbar region: Secondary | ICD-10-CM | POA: Diagnosis not present

## 2020-08-06 MED ORDER — PREDNISONE 10 MG PO TABS: 10.0000 mg | ORAL_TABLET | Freq: Every day | ORAL | 1 refills | Status: DC

## 2020-08-06 MED ORDER — MELOXICAM 15 MG PO TABS: 15.0000 mg | ORAL_TABLET | Freq: Every day | ORAL | 2 refills | Status: DC

## 2020-08-06 NOTE — Progress Notes (Signed)
Date:  08/06/2020   Name:  Jennifer Kirby   DOB:  Mar 29, 1956   MRN:  [id number]   Chief Complaint: Knee Pain (finished 30 days of pred- wants refill due to going on a trip)  Knee Pain  There was no injury mechanism. The pain is present in the right knee and left knee. The quality of the pain is described as aching. The pain is moderate. The pain has been intermittent since onset. Pertinent negatives include no inability to bear weight, loss of motion, loss of sensation, muscle weakness, numbness or tingling. She has tried NSAIDs for the symptoms. The treatment provided moderate relief.  Back Pain This is a chronic problem. The problem has been waxing and waning since onset. The pain is present in the lumbar spine. The quality of the pain is described as aching. The pain is moderate. Associated symptoms include paresthesias. Pertinent negatives include no abdominal pain, dysuria, fever, headaches, numbness, pelvic pain, tingling or weakness.    Lab Results  Component Value Date   CREATININE 0.65 06/20/2020   BUN 7 (L) 06/20/2020   NA 142 06/20/2020   K 4.2 06/20/2020   CL 104 06/20/2020   CO2 21 06/20/2020   Lab Results  Component Value Date   CHOL 233 (H) 06/20/2020   HDL 67 06/20/2020   LDLCALC 145 (H) 06/20/2020   TRIG 117 06/20/2020   CHOLHDL 3.5 07/28/2017   No results found for: TSH No results found for: HGBA1C Lab Results  Component Value Date   WBC 6.9 07/28/2017   HGB 14.4 07/28/2017   HCT 41.9 07/28/2017   MCV 86 07/28/2017   PLT 263 07/28/2017   No results found for: ALT, AST, GGT, ALKPHOS, BILITOT   Review of Systems  Constitutional: Negative.  Negative for chills, fatigue, fever and unexpected weight change.  HENT: Negative for congestion, ear discharge, ear pain, rhinorrhea, sinus pressure, sneezing and sore throat.   Eyes: Negative for photophobia, pain, discharge, redness and itching.  Respiratory: Negative for cough, shortness of breath,  wheezing and stridor.   Gastrointestinal: Negative for abdominal pain, blood in stool, constipation, diarrhea, nausea and vomiting.  Endocrine: Negative for cold intolerance, heat intolerance, polydipsia, polyphagia and polyuria.  Genitourinary: Negative for dysuria, flank pain, frequency, hematuria, menstrual problem, pelvic pain, urgency, vaginal bleeding and vaginal discharge.  Musculoskeletal: Positive for back pain. Negative for arthralgias and myalgias.  Skin: Negative for rash.  Allergic/Immunologic: Negative for environmental allergies and food allergies.  Neurological: Positive for paresthesias. Negative for dizziness, tingling, weakness, light-headedness, numbness and headaches.  Hematological: Negative for adenopathy. Does not bruise/bleed easily.  Psychiatric/Behavioral: Negative for dysphoric mood. The patient is not nervous/anxious.     Patient Active Problem List   Diagnosis Date Noted  . Obesity (BMI 30-39.9) 11/25/2018  . Contact dermatitis 02/15/2018  . Mass of skin of right shoulder 07/27/2016  . Brachial neuritis 03/08/2015  . Encounter for general adult medical examination without abnormal findings 03/08/2015  . Herpes zoster 03/08/2015  . H/O hypercholesterolemia 03/08/2015  . HLD (hyperlipidemia) 03/08/2015    Allergies  Allergen Reactions  . Statins   . Zetia [Ezetimibe] Other (See Comments)    Gave leg cramps    Past Surgical History:  Procedure Laterality Date  . FOOT SURGERY Left    plantar fasciatis and broken foot  . ROTATOR CUFF REPAIR Left 05/26/2019   bicep tear, tendon repair and skin graft - [facility]     Social History   Tobacco Use  .  Smoking status: Never Smoker  . Smokeless tobacco: Never Used  Vaping Use  . Vaping Use: Never used  Substance Use Topics  . Alcohol use: Not Currently  . Drug use: No     Medication list has been reviewed and updated.  Current Meds  Medication Sig  . cyclobenzaprine (FLEXERIL) 10 MG tablet Take 1  tablet (10 mg total) by mouth 3 (three) times daily as needed for muscle spasms.  . meloxicam (MOBIC) 15 MG tablet TAKE 1 TABLET BY MOUTH EVERY DAY  . montelukast (SINGULAIR) 10 MG tablet Take 1 tablet (10 mg total) by mouth at bedtime.  . pantoprazole (PROTONIX) 40 MG tablet Take 1 tablet (40 mg total) by mouth daily.  Orlie Dakin Sodium (SENNA PLUS) 8.6-50 MG CAPS Take 2 capsules by mouth daily.  Marland Kitchen triamcinolone cream (KENALOG) 0.1 % Apply 1 application topically 2 (two) times daily.    PHQ 2/9 Scores 06/20/2020 05/29/2020 10/31/2019 07/06/2017  PHQ - 2 Score 0 0 0 0  PHQ- 9 Score 0 - 0 0    GAD 7 : Generalized Anxiety Score 06/20/2020  Nervous, Anxious, on Edge 0  Control/stop worrying 0  Worry too much - different things 0  Trouble relaxing 0  Restless 0  Easily annoyed or irritable 0  Afraid - awful might happen 0  Total GAD 7 Score 0    BP Readings from Last 3 Encounters:  08/06/20 120/80  06/25/20 120/64  06/20/20 130/70    Physical Exam Vitals and nursing note reviewed.  Constitutional:      Appearance: She is well-developed.  HENT:     Head: Normocephalic.     Right Ear: Tympanic membrane, ear canal and external ear normal.     Left Ear: Tympanic membrane, ear canal and external ear normal.     Nose: Nose normal.  Eyes:     General: Lids are everted, no foreign bodies appreciated. No scleral icterus.       Left eye: No foreign body or hordeolum.     Conjunctiva/sclera: Conjunctivae normal.     Right eye: Right conjunctiva is not injected.     Left eye: Left conjunctiva is not injected.     Pupils: Pupils are equal, round, and reactive to light.  Neck:     Thyroid: No thyromegaly.     Vascular: No JVD.     Trachea: No tracheal deviation.  Cardiovascular:     Rate and Rhythm: Normal rate and regular rhythm.     Heart sounds: Normal heart sounds. No murmur heard.  No friction rub. No gallop.   Pulmonary:     Effort: Pulmonary effort is normal. No  respiratory distress.     Breath sounds: Normal breath sounds. No wheezing, rhonchi or rales.  Abdominal:     General: Bowel sounds are normal.     Palpations: Abdomen is soft. There is no mass.     Tenderness: There is no abdominal tenderness. There is no guarding or rebound.  Musculoskeletal:        General: No tenderness. Normal range of motion.     Cervical back: Normal range of motion and neck supple.  Lymphadenopathy:     Cervical: No cervical adenopathy.  Skin:    General: Skin is warm.     Findings: No rash.  Neurological:     Mental Status: She is alert and oriented to person, place, and time.     Cranial Nerves: No cranial nerve deficit.  Deep Tendon Reflexes: Reflexes normal.  Psychiatric:        Mood and Affect: Mood is not anxious or depressed.     Wt Readings from Last 3 Encounters:  08/06/20 151 lb (68.5 kg)  06/25/20 150 lb (68 kg)  06/20/20 139 lb (63 kg)    BP 120/80   Pulse 68   Ht 4\' 11"  (1.499 m)   Wt 151 lb (68.5 kg)   BMI 30.50 kg/m   Assessment and Plan: 1. Lumbar degenerative disc disease Chronic.  Controlled.  Stable.  Patient is having a trip to [place] in which she will be traveling in the backseat of a truck.  This will likely exacerbate her back and I will give her some prednisone to take along with her Mobic taken to she returns and after the procedure we will arrange for orthopedics for evaluation. - meloxicam (MOBIC) 15 MG tablet; Take 1 tablet (15 mg total) by mouth daily.  Dispense: 30 tablet; Refill: 2 - predniSONE (DELTASONE) 10 MG tablet; Take 1 tablet (10 mg total) by mouth daily with breakfast.  Dispense: 30 tablet; Refill: 1  2. Patellofemoral pain syndrome of both knees Chronic.  Controlled.  Stable.  We will treat with Mobic 15 mg once a day as well as prednisone 10 mg once a day. - meloxicam (MOBIC) 15 MG tablet; Take 1 tablet (15 mg total) by mouth daily.  Dispense: 30 tablet; Refill: 2 - predniSONE (DELTASONE) 10 MG  tablet; Take 1 tablet (10 mg total) by mouth daily with breakfast.  Dispense: 30 tablet; Refill: 1  3. Primary osteoarthritis of right knee Chronic.  Controlled.  Stable.  Exam is consistent with both patellofemoral concern as well as osteoarthritis and will treat with above dosings the meloxicam and prednisone. - meloxicam (MOBIC) 15 MG tablet; Take 1 tablet (15 mg total) by mouth daily.  Dispense: 30 tablet; Refill: 2 - predniSONE (DELTASONE) 10 MG tablet; Take 1 tablet (10 mg total) by mouth daily with breakfast.  Dispense: 30 tablet; Refill: 1  4. Need for pneumococcal vaccination Discussed and administered - Pneumococcal conjugate vaccine 13-valent

## 2020-08-06 NOTE — Telephone Encounter (Signed)
She can have an appt this afternoon If she wants. We need documentation for the prednisone

## 2020-08-06 NOTE — Telephone Encounter (Signed)
Copied from [place] [id number]. Topic: General - Inquiry >> Aug 06, 2020 10:18 AM Lennox Solders wrote: Reason for CRM: Pt would like tara to return her call. Pt back is hurting her and she would like a refill on prednisone. PT last seen 06-25-2020. [facility]

## 2020-10-23 ENCOUNTER — Encounter: Payer: Self-pay | Admitting: Family Medicine

## 2020-10-23 ENCOUNTER — Ambulatory Visit (INDEPENDENT_AMBULATORY_CARE_PROVIDER_SITE_OTHER): Payer: Medicare Other | Admitting: Family Medicine

## 2020-10-23 ENCOUNTER — Other Ambulatory Visit: Payer: Self-pay

## 2020-10-23 VITALS — BP 138/82 | HR 90 | Ht 59.0 in | Wt 153.0 lb

## 2020-10-23 DIAGNOSIS — M222X1 Patellofemoral disorders, right knee: Secondary | ICD-10-CM

## 2020-10-23 DIAGNOSIS — S838X9A Sprain of other specified parts of unspecified knee, initial encounter: Secondary | ICD-10-CM | POA: Diagnosis not present

## 2020-10-23 DIAGNOSIS — M170 Bilateral primary osteoarthritis of knee: Secondary | ICD-10-CM

## 2020-10-23 DIAGNOSIS — M222X2 Patellofemoral disorders, left knee: Secondary | ICD-10-CM

## 2020-10-23 NOTE — Patient Instructions (Signed)
Patellofemoral Pain Syndrome  Patellofemoral pain syndrome is a condition in which the tissue (cartilage) on the underside of the kneecap (patella) softens or breaks down. This causes pain in the front of the knee. The condition is also called runner's knee or chondromalacia patella. Patellofemoral pain syndrome is most common in young adults who are active in sports. The knee is the largest joint in the body. The patella covers the front of the knee and is attached to muscles above and below the knee. The underside of the patella is covered with a smooth type of cartilage (synovium). The smooth surface helps the patella glide easily when you move your knee. Patellofemoral pain syndrome causes swelling in the joint linings and bone surfaces in the knee. What are the causes? This condition may be caused by:  Overuse of the knee.  Poor alignment of your knee joints.  Weak leg muscles.  A direct hit to your kneecap. What increases the risk? You are more likely to develop this condition if:  You do a lot of activities that can wear down your kneecap. These include: ? Running. ? Squatting. ? Climbing stairs.  You start a new physical activity or exercise program.  You wear shoes that do not fit well.  You do not have good leg strength.  You are overweight. What are the signs or symptoms? The main symptom of this condition is knee pain. This may feel like a dull, aching pain underneath your patella, in the front of your knee. There may be a popping or cracking sound when you move your knee. Pain may get worse with:  Exercise.  Climbing stairs.  Running.  Jumping.  Squatting.  Kneeling.  Sitting for a long time.  Moving or pushing on your patella. How is this diagnosed? This condition may be diagnosed based on:  Your symptoms and medical history. You may be asked about your recent physical activities and which ones cause knee pain.  A physical exam. This may  include: ? Moving your patella back and forth. ? Checking your range of knee motion. ? Having you squat or jump to see if you have pain. ? Checking the strength of your leg muscles.  Imaging tests to confirm the diagnosis. These may include an MRI of your knee. How is this treated? This condition may be treated at home with rest, ice, compression, and elevation (RICE).  Other treatments may include:  NSAIDs, such as ibuprofen.  Physical therapy to stretch and strengthen your leg muscles.  Shoe inserts (orthotics) to take stress off your knee.  A knee brace or knee support.  Adhesive tapes to the skin.  Surgery to remove damaged cartilage or move the patella to a better position. This is rare. Follow these instructions at home: If you have a brace:  Wear the brace as told by your health care provider. Remove it only as told by your health care provider.  Loosen the brace if your toes tingle, become numb, or turn cold and blue.  Keep the brace clean.  If the brace is not waterproof: ? Do not let it get wet. ? Cover it with a watertight covering when you take a bath or a shower. Managing pain, stiffness, and swelling  If directed, put ice on the painful area. To do this: ? If you have a removable brace, remove it as told by your health care provider. ? Put ice in a plastic bag. ? Place a towel between your skin and the bag. ?  Leave the ice on for 20 minutes, 2-3 times a day. ? Remove the ice if your skin turns bright red. This is very important. If you cannot feel pain, heat, or cold, you have a greater risk of damage to the area.  Move your toes often to reduce stiffness and swelling.  Raise (elevate) the injured area above the level of your heart while you are sitting or lying down.   Activity  Rest your knee.  Avoid activities that cause knee pain.  Perform stretching and strengthening exercises as told by your health care provider or physical therapist.  Return  to your normal activities as told by your health care provider. Ask your health care provider what activities are safe for you. General instructions  Take over-the-counter and prescription medicines only as told by your health care provider.  Use splints, braces, knee supports, or walking aids as directed by your health care provider.  Do not use any products that contain nicotine or tobacco, such as cigarettes, e-cigarettes, and chewing tobacco. These can delay healing. If you need help quitting, ask your health care provider.  Keep all follow-up visits. This is important. Contact a health care provider if:  Your symptoms get worse.  You are not improving with home care. Summary  Patellofemoral pain syndrome is a condition in which the tissue (cartilage) on the underside of the kneecap (patella) softens or breaks down.  This condition causes swelling in the joint linings and bone surfaces in the knee. This leads to pain in the front of the knee.  This condition may be treated at home with rest, ice, compression, and elevation (RICE).  Use splints, braces, knee supports, or walking aids as directed by your health care provider. This information is not intended to replace advice given to you by your health care provider. Make sure you discuss any questions you have with your health care provider. Document Revised: 02/28/2020 Document Reviewed: 02/28/2020 Elsevier Patient Education  2021 [employer].

## 2020-10-23 NOTE — Progress Notes (Signed)
Date:  10/23/2020   Name:  Jennifer Kirby   DOB:  December 02, 1955   MRN:  [id number]   Chief Complaint: Knee Pain  Knee Pain  The incident occurred more than 1 week ago (last wednesday). The incident occurred at home. There was no injury mechanism (popping sensation). The quality of the pain is described as aching. The pain is mild. The pain has been intermittent since onset. Associated symptoms include a loss of motion. Pertinent negatives include no inability to bear weight, loss of sensation, muscle weakness, numbness or tingling. She has tried NSAIDs for the symptoms. The treatment provided moderate relief.    Lab Results  Component Value Date   CREATININE 0.65 06/20/2020   BUN 7 (L) 06/20/2020   NA 142 06/20/2020   K 4.2 06/20/2020   CL 104 06/20/2020   CO2 21 06/20/2020   Lab Results  Component Value Date   CHOL 233 (H) 06/20/2020   HDL 67 06/20/2020   LDLCALC 145 (H) 06/20/2020   TRIG 117 06/20/2020   CHOLHDL 3.5 07/28/2017   No results found for: TSH No results found for: HGBA1C Lab Results  Component Value Date   WBC 6.9 07/28/2017   HGB 14.4 07/28/2017   HCT 41.9 07/28/2017   MCV 86 07/28/2017   PLT 263 07/28/2017   No results found for: ALT, AST, GGT, ALKPHOS, BILITOT   Review of Systems  Constitutional: Negative.  Negative for chills, fatigue, fever and unexpected weight change.  HENT: Negative for congestion, ear discharge, ear pain, rhinorrhea, sinus pressure, sneezing and sore throat.   Eyes: Negative for double vision, photophobia, pain, discharge, redness and itching.  Respiratory: Negative for cough, shortness of breath, wheezing and stridor.   Cardiovascular: Negative for chest pain, palpitations and leg swelling.  Gastrointestinal: Negative for abdominal pain, blood in stool, constipation, diarrhea, nausea and vomiting.  Endocrine: Negative for cold intolerance, heat intolerance, polydipsia, polyphagia and polyuria.  Genitourinary: Negative  for dysuria, flank pain, frequency, hematuria, menstrual problem, pelvic pain, urgency, vaginal bleeding and vaginal discharge.  Musculoskeletal: Negative for arthralgias, back pain and myalgias.  Skin: Negative for rash.  Allergic/Immunologic: Negative for environmental allergies and food allergies.  Neurological: Negative for dizziness, tingling, weakness, light-headedness, numbness and headaches.  Hematological: Negative for adenopathy. Does not bruise/bleed easily.  Psychiatric/Behavioral: Negative for dysphoric mood. The patient is not nervous/anxious.     Patient Active Problem List   Diagnosis Date Noted  . Obesity (BMI 30-39.9) 11/25/2018  . Contact dermatitis 02/15/2018  . Mass of skin of right shoulder 07/27/2016  . Brachial neuritis 03/08/2015  . Encounter for general adult medical examination without abnormal findings 03/08/2015  . Herpes zoster 03/08/2015  . H/O hypercholesterolemia 03/08/2015  . HLD (hyperlipidemia) 03/08/2015    Allergies  Allergen Reactions  . Statins   . Zetia [Ezetimibe] Other (See Comments)    Gave leg cramps    Past Surgical History:  Procedure Laterality Date  . FOOT SURGERY Left    plantar fasciatis and broken foot  . ROTATOR CUFF REPAIR Left 05/26/2019   bicep tear, tendon repair and skin graft - [facility]     Social History   Tobacco Use  . Smoking status: Never Smoker  . Smokeless tobacco: Never Used  Vaping Use  . Vaping Use: Never used  Substance Use Topics  . Alcohol use: Not Currently  . Drug use: No     Medication list has been reviewed and updated.  Current Meds  Medication Sig  .  meloxicam (MOBIC) 15 MG tablet Take 1 tablet (15 mg total) by mouth daily.  . pantoprazole (PROTONIX) 40 MG tablet Take 1 tablet (40 mg total) by mouth daily.  Orlie Dakin Sodium (SENNA PLUS) 8.6-50 MG CAPS Take 2 capsules by mouth daily.  . [DISCONTINUED] triamcinolone cream (KENALOG) 0.1 % Apply 1 application topically 2 (two)  times daily.    PHQ 2/9 Scores 06/20/2020 05/29/2020 10/31/2019 07/06/2017  PHQ - 2 Score 0 0 0 0  PHQ- 9 Score 0 - 0 0    GAD 7 : Generalized Anxiety Score 06/20/2020  Nervous, Anxious, on Edge 0  Control/stop worrying 0  Worry too much - different things 0  Trouble relaxing 0  Restless 0  Easily annoyed or irritable 0  Afraid - awful might happen 0  Total GAD 7 Score 0    BP Readings from Last 3 Encounters:  10/23/20 138/82  08/06/20 120/80  06/25/20 120/64    Physical Exam Vitals and nursing note reviewed.  Constitutional:      Appearance: She is well-developed and well-nourished.  HENT:     Head: Normocephalic.     Right Ear: Tympanic membrane, ear canal and external ear normal.     Left Ear: Tympanic membrane, ear canal and external ear normal.     Nose: Nose normal. No congestion or rhinorrhea.     Mouth/Throat:     Mouth: Oropharynx is clear and moist. Mucous membranes are moist.  Eyes:     General: Lids are everted, no foreign bodies appreciated. No scleral icterus.       Left eye: No foreign body or hordeolum.     Extraocular Movements: Extraocular movements intact and EOM normal.     Conjunctiva/sclera: Conjunctivae normal.     Right eye: Right conjunctiva is not injected.     Left eye: Left conjunctiva is not injected.     Pupils: Pupils are equal, round, and reactive to light.  Neck:     Thyroid: No thyromegaly.     Vascular: No JVD.     Trachea: No tracheal deviation.  Cardiovascular:     Rate and Rhythm: Normal rate and regular rhythm.     Pulses: Intact distal pulses.     Heart sounds: Normal heart sounds. No murmur heard. No friction rub. No gallop.   Pulmonary:     Effort: Pulmonary effort is normal. No respiratory distress.     Breath sounds: Normal breath sounds. No wheezing, rhonchi or rales.  Abdominal:     General: Abdomen is flat. Bowel sounds are normal.     Palpations: Abdomen is soft. There is no hepatosplenomegaly or mass.     Tenderness:  There is no abdominal tenderness. There is no guarding or rebound.  Musculoskeletal:        General: No edema. Normal range of motion.     Cervical back: Normal range of motion and neck supple.     Right knee: No swelling. Tenderness present over the medial joint line and lateral joint line.     Left knee: No swelling. Tenderness present over the medial joint line and lateral joint line.  Lymphadenopathy:     Cervical: No cervical adenopathy.  Skin:    General: Skin is warm.     Capillary Refill: Capillary refill takes less than 2 seconds.     Findings: No rash.  Neurological:     Mental Status: She is alert and oriented to person, place, and time.     Cranial  Nerves: No cranial nerve deficit.     Deep Tendon Reflexes: Strength normal. Reflexes normal.  Psychiatric:        Mood and Affect: Mood and affect normal. Mood is not anxious or depressed.     Wt Readings from Last 3 Encounters:  10/23/20 153 lb (69.4 kg)  08/06/20 151 lb (68.5 kg)  06/25/20 150 lb (68 kg)    BP 138/82   Pulse 90   Ht 4\' 11"  (1.499 m)   Wt 153 lb (69.4 kg)   BMI 30.90 kg/m   Assessment and Plan: 1. Patellofemoral pain syndrome of both knees Chronic.  Uncontrolled.  Stable.  Patient had an episode when she was standing that she felt a sudden pop in with pain primarily in the left but there is overall pain involving both knees to a certain extent most days as well.  Exam notes particular tenderness of the medial joint line on the left knee but has full range of motion.  There is some crepitance underneath both patella as well which is consistent for patellofemoral syndrome.  Patient is currently on meloxicam and I have encouraged her to continue and that we would like to avoid prednisone at this time.  We would like to become more diagnostic and she would like to be seen by [facility] in [place] for evaluation of persistent pain and treatment thereof. - Ambulatory referral to Orthopedic Surgery  2.  Primary osteoarthritis of both knees As noted above. - Ambulatory referral to Orthopedic Surgery  3. Meniscal injury, unspecified laterality, initial encounter As noted above. - Ambulatory referral to Orthopedic Surgery

## 2020-10-24 DIAGNOSIS — M170 Bilateral primary osteoarthritis of knee: Secondary | ICD-10-CM | POA: Diagnosis not present

## 2020-11-08 DIAGNOSIS — M25562 Pain in left knee: Secondary | ICD-10-CM | POA: Diagnosis not present

## 2020-11-08 DIAGNOSIS — M25561 Pain in right knee: Secondary | ICD-10-CM | POA: Diagnosis not present

## 2020-11-08 DIAGNOSIS — R262 Difficulty in walking, not elsewhere classified: Secondary | ICD-10-CM | POA: Diagnosis not present

## 2020-11-11 DIAGNOSIS — M25562 Pain in left knee: Secondary | ICD-10-CM | POA: Diagnosis not present

## 2020-11-11 DIAGNOSIS — R262 Difficulty in walking, not elsewhere classified: Secondary | ICD-10-CM | POA: Diagnosis not present

## 2020-11-11 DIAGNOSIS — M25561 Pain in right knee: Secondary | ICD-10-CM | POA: Diagnosis not present

## 2020-11-15 DIAGNOSIS — R262 Difficulty in walking, not elsewhere classified: Secondary | ICD-10-CM | POA: Diagnosis not present

## 2020-11-15 DIAGNOSIS — M25562 Pain in left knee: Secondary | ICD-10-CM | POA: Diagnosis not present

## 2020-11-15 DIAGNOSIS — M25561 Pain in right knee: Secondary | ICD-10-CM | POA: Diagnosis not present

## 2020-11-20 DIAGNOSIS — R262 Difficulty in walking, not elsewhere classified: Secondary | ICD-10-CM | POA: Diagnosis not present

## 2020-11-20 DIAGNOSIS — M25562 Pain in left knee: Secondary | ICD-10-CM | POA: Diagnosis not present

## 2020-11-20 DIAGNOSIS — M25561 Pain in right knee: Secondary | ICD-10-CM | POA: Diagnosis not present

## 2020-11-22 DIAGNOSIS — R262 Difficulty in walking, not elsewhere classified: Secondary | ICD-10-CM | POA: Diagnosis not present

## 2020-11-22 DIAGNOSIS — M25562 Pain in left knee: Secondary | ICD-10-CM | POA: Diagnosis not present

## 2020-11-22 DIAGNOSIS — M25561 Pain in right knee: Secondary | ICD-10-CM | POA: Diagnosis not present

## 2020-11-25 DIAGNOSIS — R262 Difficulty in walking, not elsewhere classified: Secondary | ICD-10-CM | POA: Diagnosis not present

## 2020-11-25 DIAGNOSIS — M25561 Pain in right knee: Secondary | ICD-10-CM | POA: Diagnosis not present

## 2020-11-25 DIAGNOSIS — M25562 Pain in left knee: Secondary | ICD-10-CM | POA: Diagnosis not present

## 2020-11-27 DIAGNOSIS — M25562 Pain in left knee: Secondary | ICD-10-CM | POA: Diagnosis not present

## 2020-11-27 DIAGNOSIS — R262 Difficulty in walking, not elsewhere classified: Secondary | ICD-10-CM | POA: Diagnosis not present

## 2020-11-27 DIAGNOSIS — M25561 Pain in right knee: Secondary | ICD-10-CM | POA: Diagnosis not present

## 2020-12-04 DIAGNOSIS — M25562 Pain in left knee: Secondary | ICD-10-CM | POA: Diagnosis not present

## 2020-12-04 DIAGNOSIS — M25561 Pain in right knee: Secondary | ICD-10-CM | POA: Diagnosis not present

## 2020-12-04 DIAGNOSIS — R262 Difficulty in walking, not elsewhere classified: Secondary | ICD-10-CM | POA: Diagnosis not present

## 2020-12-06 DIAGNOSIS — R262 Difficulty in walking, not elsewhere classified: Secondary | ICD-10-CM | POA: Diagnosis not present

## 2020-12-06 DIAGNOSIS — M25562 Pain in left knee: Secondary | ICD-10-CM | POA: Diagnosis not present

## 2020-12-06 DIAGNOSIS — M25561 Pain in right knee: Secondary | ICD-10-CM | POA: Diagnosis not present

## 2020-12-11 DIAGNOSIS — M25562 Pain in left knee: Secondary | ICD-10-CM | POA: Diagnosis not present

## 2020-12-11 DIAGNOSIS — M25561 Pain in right knee: Secondary | ICD-10-CM | POA: Diagnosis not present

## 2020-12-11 DIAGNOSIS — R262 Difficulty in walking, not elsewhere classified: Secondary | ICD-10-CM | POA: Diagnosis not present

## 2020-12-13 DIAGNOSIS — M25562 Pain in left knee: Secondary | ICD-10-CM | POA: Diagnosis not present

## 2020-12-13 DIAGNOSIS — R262 Difficulty in walking, not elsewhere classified: Secondary | ICD-10-CM | POA: Diagnosis not present

## 2020-12-13 DIAGNOSIS — M25561 Pain in right knee: Secondary | ICD-10-CM | POA: Diagnosis not present

## 2020-12-16 DIAGNOSIS — R262 Difficulty in walking, not elsewhere classified: Secondary | ICD-10-CM | POA: Diagnosis not present

## 2020-12-16 DIAGNOSIS — M25561 Pain in right knee: Secondary | ICD-10-CM | POA: Diagnosis not present

## 2020-12-16 DIAGNOSIS — M25562 Pain in left knee: Secondary | ICD-10-CM | POA: Diagnosis not present

## 2020-12-19 DIAGNOSIS — R262 Difficulty in walking, not elsewhere classified: Secondary | ICD-10-CM | POA: Diagnosis not present

## 2020-12-19 DIAGNOSIS — M25561 Pain in right knee: Secondary | ICD-10-CM | POA: Diagnosis not present

## 2020-12-19 DIAGNOSIS — M25562 Pain in left knee: Secondary | ICD-10-CM | POA: Diagnosis not present

## 2020-12-23 DIAGNOSIS — R262 Difficulty in walking, not elsewhere classified: Secondary | ICD-10-CM | POA: Diagnosis not present

## 2020-12-23 DIAGNOSIS — M25562 Pain in left knee: Secondary | ICD-10-CM | POA: Diagnosis not present

## 2020-12-23 DIAGNOSIS — M25561 Pain in right knee: Secondary | ICD-10-CM | POA: Diagnosis not present

## 2021-01-03 IMAGING — MG DIGITAL SCREENING BILAT W/ TOMO W/ CAD
8 series · 8 of 24 positions shown · non-contrast
Comparison: Previous exam(s).

CLINICAL DATA: Screening.

EXAM:
DIGITAL SCREENING BILATERAL MAMMOGRAM WITH TOMO AND CAD

[R CC synth-2D]
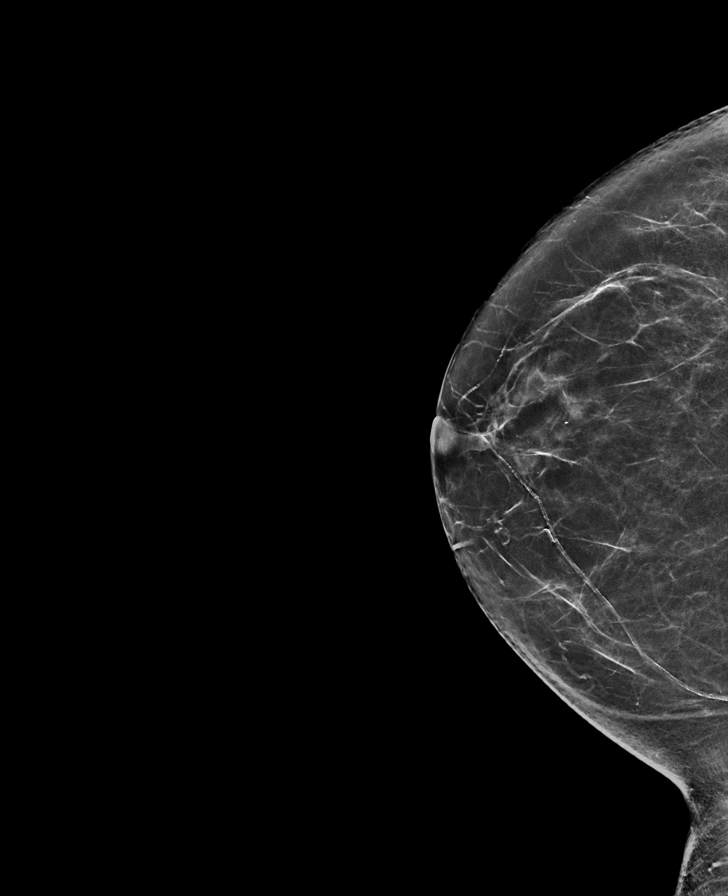

[L CC synth-2D]
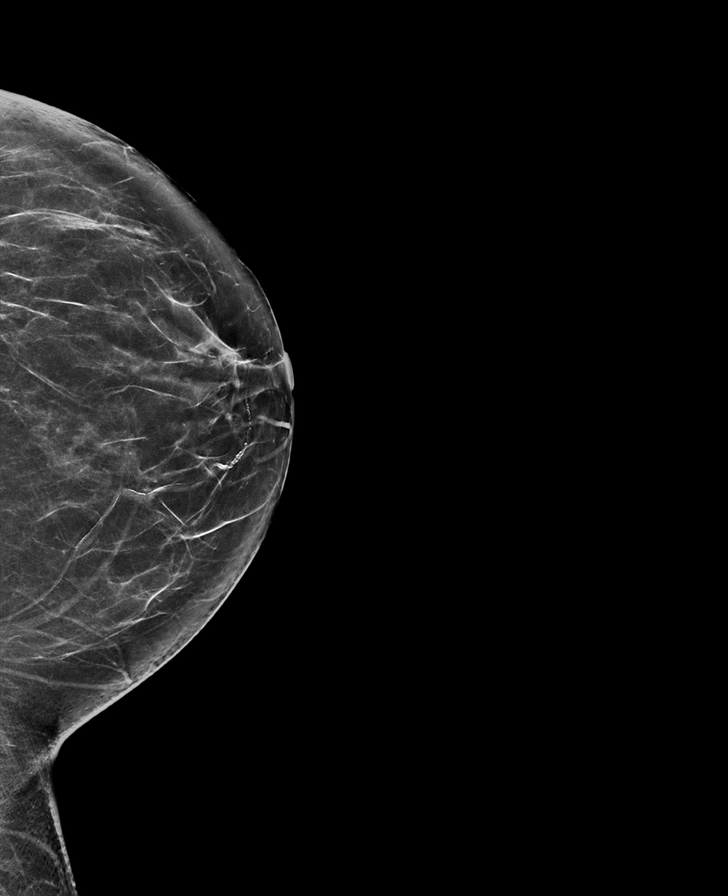

[L MLO synth-2D]
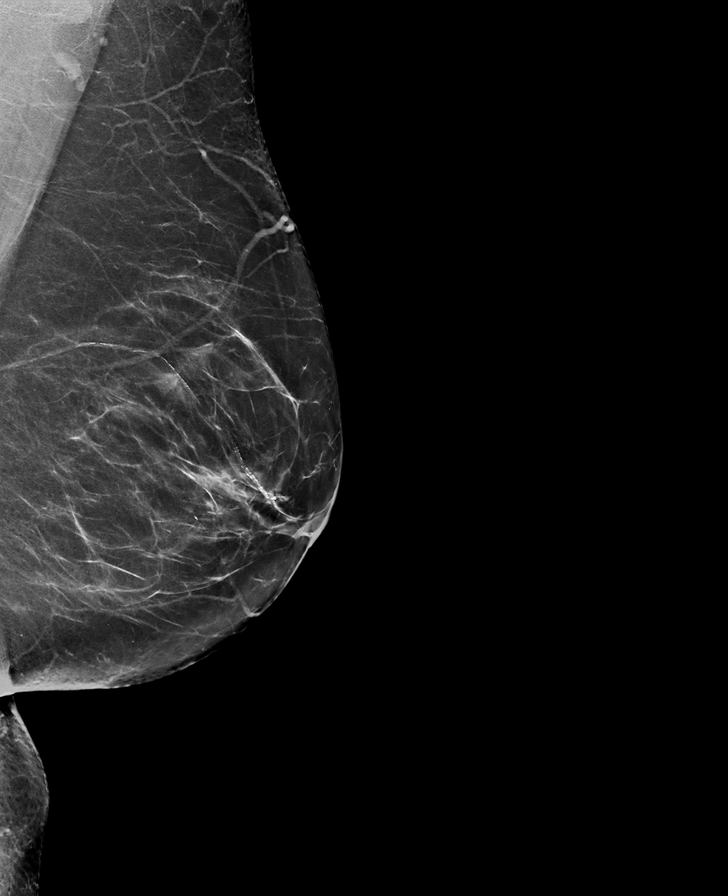

[R MLO synth-2D]
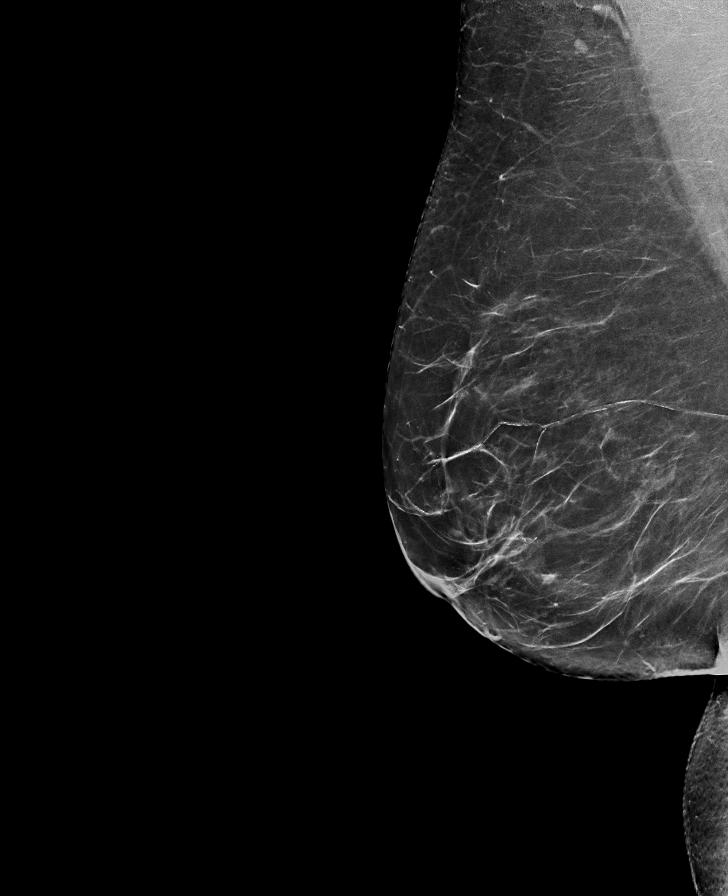

[R MLO tomo · tomo slice 39/76.0]
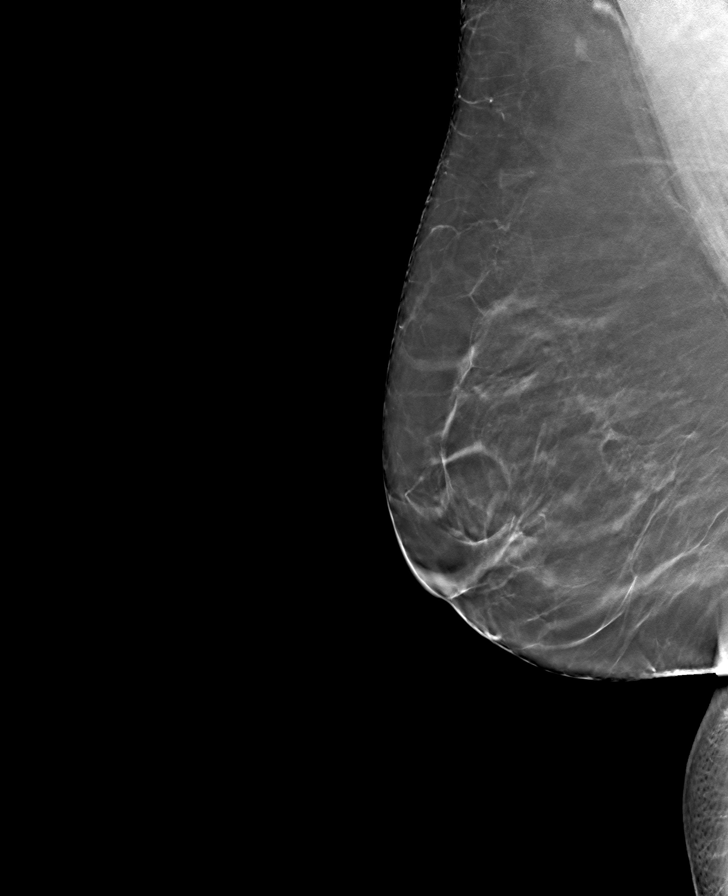

[R CC tomo · tomo slice 33/66.0]
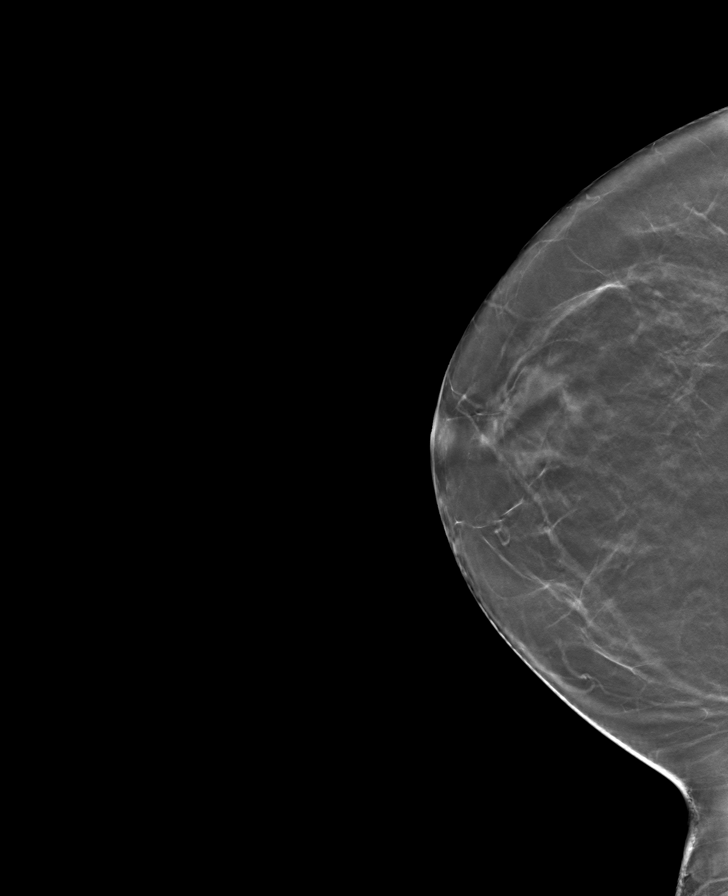

[L CC tomo · tomo slice 36/71.0]
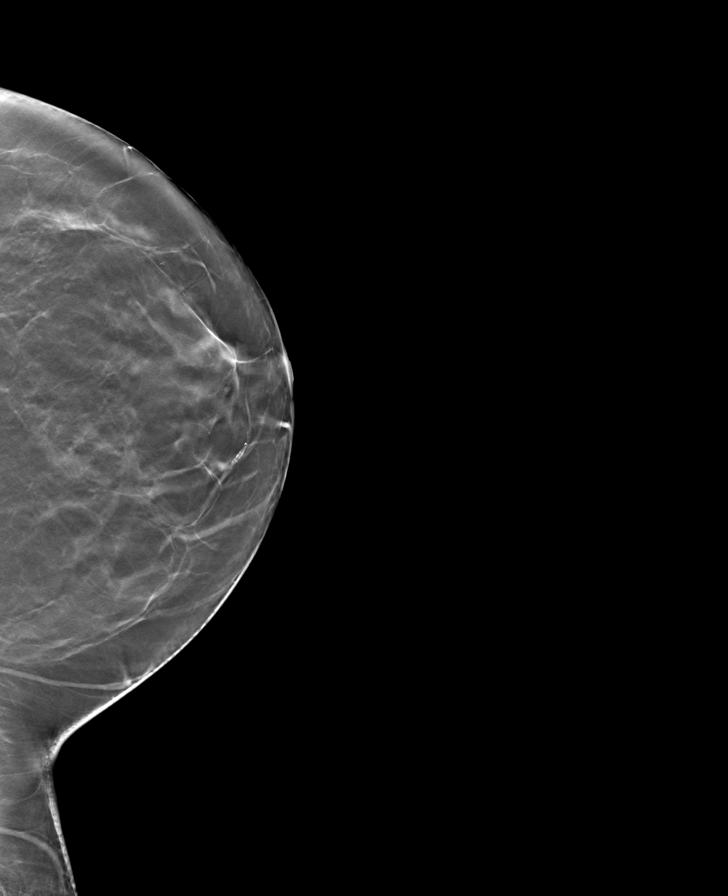

[L MLO tomo · tomo slice 39/76.0]
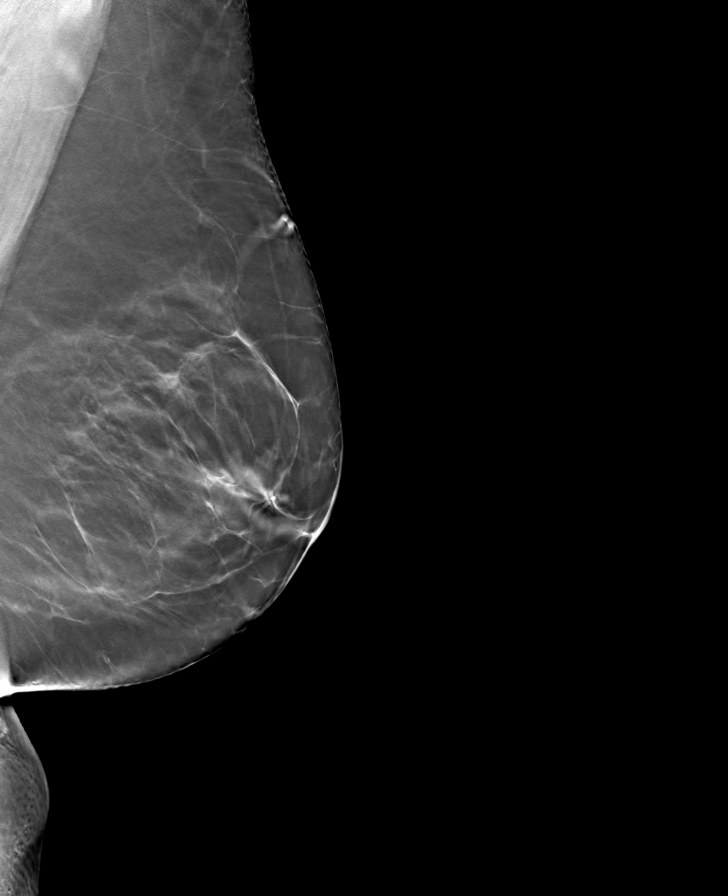

[8 of 24 positions shown; findings below may reference images not displayed]

ACR Breast Density Category b: There are scattered areas of
fibroglandular density.
FINDINGS: There are no findings suspicious for malignancy. Images were
processed with CAD.
IMPRESSION: No mammographic evidence of malignancy. A result letter of this
screening mammogram will be mailed directly to the patient.

RECOMMENDATION:
Screening mammogram in one year. (Code:[id number])

BI-RADS CATEGORY  1: Negative.

## 2021-01-15 ENCOUNTER — Encounter: Payer: Self-pay | Admitting: Dermatology

## 2021-01-15 ENCOUNTER — Other Ambulatory Visit: Payer: Self-pay

## 2021-01-15 ENCOUNTER — Ambulatory Visit (INDEPENDENT_AMBULATORY_CARE_PROVIDER_SITE_OTHER): Payer: Medicare Other | Admitting: Dermatology

## 2021-01-15 DIAGNOSIS — L570 Actinic keratosis: Secondary | ICD-10-CM

## 2021-01-15 DIAGNOSIS — L719 Rosacea, unspecified: Secondary | ICD-10-CM | POA: Diagnosis not present

## 2021-01-15 MED ORDER — METRONIDAZOLE 0.75 % EX CREA: TOPICAL_CREAM | CUTANEOUS | 5 refills | Status: DC

## 2021-01-15 MED ORDER — DOXYCYCLINE MONOHYDRATE 100 MG PO TABS: 100.0000 mg | ORAL_TABLET | Freq: Every day | ORAL | 1 refills | Status: DC

## 2021-01-15 NOTE — Patient Instructions (Addendum)
Cryotherapy Aftercare  . Wash gently with soap and water everyday.   Marland Kitchen Apply Vaseline and Band-Aid daily until healed.   Rosacea  What is rosacea? Rosacea (say: ro-zay-sha) is a common skin disease that usually begins as a trend of flushing or blushing easily.  As rosacea progresses, a persistent redness in the center of the face will develop and may gradually spread beyond the nose and cheeks to the forehead and chin.  In some cases, the ears, chest, and back could be affected.  Rosacea may appear as tiny blood vessels or small red bumps that occur in crops.  Frequently they can contain pus, and are called "pustules".  If the bumps do not contain pus, they are referred to as "papules".  Rarely, in prolonged, untreated cases of rosacea, the oil glands of the nose and cheeks may become permanently enlarged.  This is called rhinophyma, and is seen more frequently in men.  Signs and Risks In its beginning stages, rosacea tends to come and go, which makes it difficult to recognize.  It can start as intermittent flushing of the face.  Eventually, blood vessels may become permanently visible.  Pustules and papules can appear, but can be mistaken for adult acne.  People of all races, ages, genders and ethnic groups are at risk of developing rosacea.  However, it is more common in women (especially around menopause) and adults with fair skin between the ages of 67 and 43.  Treatment Dermatologists typically recommend a combination of treatments to effectively manage rosacea.  Treatment can improve symptoms and may stop the progression of the rosacea.  Treatment may involve both topical and oral medications.  The tetracycline antibiotics are often used for their anti-inflammatory effect; however, because of the possibility of developing antibiotic resistance, they should not be used long term at full dose.  For dilated blood vessels the options include electrodessication (uses electric current through a small  needle), laser treatment, and cosmetics to hide the redness.   With all forms of treatment, improvement is a slow process, and patients may not see any results for the first 3-4 weeks.  It is very important to avoid the sun and other triggers.  Patients must wear sunscreen daily.  Skin Care Instructions: 1. Cleanse the skin with a mild soap such as CeraVe cleanser, Cetaphil cleanser, or Dove soap once or twice daily as needed. 2. Moisturize with Eucerin Redness Relief Daily Perfecting Lotion (has a subtle green tint), CeraVe Moisturizing Cream, or Oil of Olay Daily Moisturizer with sunscreen every morning and/or night as recommended. 3. Makeup should be "non-comedogenic" (won't clog pores) and be labeled "for sensitive skin". Good choices for cosmetics are: Neutrogena, Almay, and Physician's Formula.  Any product with a green tint tends to offset a red complexion. 4. If your eyes are dry and irritated, use artificial tears 2-3 times per day and cleanse the eyelids daily with baby shampoo.  Have your eyes examined at least every 2 years.  Be sure to tell your eye doctor that you have rosacea. 5. Alcoholic beverages tend to cause flushing of the skin, and may make rosacea worse. 6. Always wear sunscreen, protect your skin from extreme hot and cold temperatures, and avoid spicy foods, hot drinks, and mechanical irritation such as rubbing, scrubbing, or massaging the face.  Avoid harsh skin cleansers, cleansing masks, astringents, and exfoliation. If a particular product burns or makes your face feel tight, then it is likely to flare your rosacea. 7. If you are  having difficulty finding a sunscreen that you can tolerate, you may try switching to a chemical-free sunscreen.  These are ones whose active ingredient is zinc oxide or titanium dioxide only.  They should also be fragrance free, non-comedogenic, and labeled for sensitive skin. 8. Rosacea triggers may vary from person to person.  There are a variety of  foods that have been reported to trigger rosacea.  Some patients find that keeping a diary of what they were doing when they flared helps them avoid triggers.   Doxycycline should be taken with food to prevent nausea. Do not lay down for 30 minutes after taking. Be cautious with sun exposure and use good sun protection while on this medication. Pregnant women should not take this medication.    If you have any questions or concerns for your doctor, please call our main line at [phone] and press option 4 to reach your doctor's medical assistant. If no one answers, please leave a voicemail as directed and we will return your call as soon as possible. Messages left after 4 pm will be answered the following business day.   You may also send [place] a message via [place]. We typically respond to MyChart messages within 1-2 business days.  For prescription refills, please ask your pharmacy to contact our office. Our fax number is [phone].  If you have an urgent issue when the clinic is closed that cannot wait until the next business day, you can page your doctor at the number below.    Please note that while we do our best to be available for urgent issues outside of office hours, we are not available 24/7.   If you have an urgent issue and are unable to reach [place], you may choose to seek medical care at your doctor's office, retail clinic, urgent care center, or emergency room.  If you have a medical emergency, please immediately call 911 or go to the emergency department.  Pager Numbers  - Dr. Nehemiah Massed: [phone]  - Dr. Laurence Ferrari: [phone]  - Dr. Nicole Kindred: [phone]  In the event of inclement weather, please call our main line at [phone] for an update on the status of any delays or closures.  Dermatology Medication Tips: Please keep the boxes that topical medications come in in order to help keep track of the instructions about where and how to use these. Pharmacies typically  print the medication instructions only on the boxes and not directly on the medication tubes.   If your medication is too expensive, please contact our office at [phone] option 4 or send [place] a message through [place].   We are unable to tell what your co-pay for medications will be in advance as this is different depending on your insurance coverage. However, we may be able to find a substitute medication at lower cost or fill out paperwork to get insurance to cover a needed medication.   If a prior authorization is required to get your medication covered by your insurance company, please allow [place] 1-2 business days to complete this process.  Drug prices often vary depending on where the prescription is filled and some pharmacies may offer cheaper prices.  The website www.goodrx.com contains coupons for medications through different pharmacies. The prices here do not account for what the cost may be with help from insurance (it may be cheaper with your insurance), but the website can give you the price if you did not use any insurance.  - You can print the associated coupon  and take it with your prescription to the pharmacy.  - You may also stop by our office during regular business hours and pick up a GoodRx coupon card.  - If you need your prescription sent electronically to a different pharmacy, notify our office through [facility] or by phone at [phone] option 4.

## 2021-01-15 NOTE — Progress Notes (Signed)
   Follow-Up Visit   Subjective  Jennifer Kirby is a 65 y.o. female who presents for the following: Rough spots (Face x months, some are sore. Hx of Aks treated in the past.).  Also redness on face she would like addressed.   The following portions of the chart were reviewed this encounter and updated as appropriate:       Review of Systems:  No other skin or systemic complaints except as noted in HPI or Assessment and Plan.  Objective  Well appearing patient in no apparent distress; mood and affect are within normal limits.  A focused examination was performed including face. Relevant physical exam findings are noted in the Assessment and Plan.  Objective  Forehead x 4, R lateral brow x 1, L cheek x 1 (6): Pink scaly macules.  Objective  face: Erythema on malar cheeks and chin with telangiectasias. Edema on right malar cheek. Resolving inflammatory papule on right cheek.   Assessment & Plan  AK (actinic keratosis) (6) Forehead x 4, R lateral brow x 1, L cheek x 1  vs ISK (L cheek)  Prior to procedure, discussed risks of blister formation, small wound, skin dyspigmentation, or rare scar following cryotherapy.    Destruction of lesion - Forehead x 4, R lateral brow x 1, L cheek x 1  Destruction method: cryotherapy   Informed consent: discussed and consent obtained   Lesion destroyed using liquid nitrogen: Yes   Region frozen until ice ball extended beyond lesion: Yes   Outcome: patient tolerated procedure well with no complications   Post-procedure details: wound care instructions given    Rosacea face  Rosacea is a chronic progressive skin condition usually affecting the face of adults, causing redness and/or acne bumps. It is treatable but not curable. It sometimes affects the eyes (ocular rosacea) as well. It may respond to topical and/or systemic medication and can flare with stress, sun exposure, alcohol, exercise and some foods.  Daily application of broad  spectrum spf 30+ sunscreen to face is recommended to reduce flares.  Start doxycycline 100mg  take 1 po QD dsp #30 1Rf  Start metronidazole cream Apply to mid face QD/BID dsp 45g   Doxycycline should be taken with food to prevent nausea. Do not lay down for 30 minutes after taking. Be cautious with sun exposure and use good sun protection while on this medication. Pregnant women should not take this medication.   Discussed BBL procedure, noncovered, to treat redness    metroNIDAZOLE (METROCREAM) 0.75 % cream - face  doxycycline (ADOXA) 100 MG tablet - face  Return in about 3 months (around 04/16/2021) for f/u Rosacea, TBSE.  IJamesetta Orleans, CMA, am acting as scribe for Brendolyn Patty, MD .  Documentation: I have reviewed the above documentation for accuracy and completeness, and I agree with the above.  Brendolyn Patty MD

## 2021-02-25 ENCOUNTER — Other Ambulatory Visit: Payer: Self-pay | Admitting: Dermatology

## 2021-02-25 DIAGNOSIS — L719 Rosacea, unspecified: Secondary | ICD-10-CM

## 2021-03-26 DIAGNOSIS — H43811 Vitreous degeneration, right eye: Secondary | ICD-10-CM | POA: Diagnosis not present

## 2021-03-26 DIAGNOSIS — H2513 Age-related nuclear cataract, bilateral: Secondary | ICD-10-CM | POA: Diagnosis not present

## 2021-04-07 DIAGNOSIS — H5213 Myopia, bilateral: Secondary | ICD-10-CM | POA: Diagnosis not present

## 2021-04-21 ENCOUNTER — Ambulatory Visit: Payer: Medicare Other | Admitting: Dermatology

## 2021-05-20 ENCOUNTER — Other Ambulatory Visit: Payer: Self-pay

## 2021-05-20 ENCOUNTER — Ambulatory Visit (INDEPENDENT_AMBULATORY_CARE_PROVIDER_SITE_OTHER): Payer: Medicare Other | Admitting: Dermatology

## 2021-05-20 DIAGNOSIS — L570 Actinic keratosis: Secondary | ICD-10-CM | POA: Diagnosis not present

## 2021-05-20 DIAGNOSIS — L821 Other seborrheic keratosis: Secondary | ICD-10-CM

## 2021-05-20 DIAGNOSIS — L918 Other hypertrophic disorders of the skin: Secondary | ICD-10-CM

## 2021-05-20 DIAGNOSIS — L859 Epidermal thickening, unspecified: Secondary | ICD-10-CM

## 2021-05-20 DIAGNOSIS — D229 Melanocytic nevi, unspecified: Secondary | ICD-10-CM

## 2021-05-20 DIAGNOSIS — L820 Inflamed seborrheic keratosis: Secondary | ICD-10-CM

## 2021-05-20 DIAGNOSIS — L578 Other skin changes due to chronic exposure to nonionizing radiation: Secondary | ICD-10-CM

## 2021-05-20 DIAGNOSIS — L74519 Primary focal hyperhidrosis, unspecified: Secondary | ICD-10-CM | POA: Diagnosis not present

## 2021-05-20 DIAGNOSIS — Z1283 Encounter for screening for malignant neoplasm of skin: Secondary | ICD-10-CM

## 2021-05-20 DIAGNOSIS — D692 Other nonthrombocytopenic purpura: Secondary | ICD-10-CM

## 2021-05-20 DIAGNOSIS — L719 Rosacea, unspecified: Secondary | ICD-10-CM

## 2021-05-20 DIAGNOSIS — D1800 Hemangioma unspecified site: Secondary | ICD-10-CM

## 2021-05-20 DIAGNOSIS — L814 Other melanin hyperpigmentation: Secondary | ICD-10-CM

## 2021-05-20 DIAGNOSIS — L853 Xerosis cutis: Secondary | ICD-10-CM

## 2021-05-20 MED ORDER — DRYSOL 20 % EX SOLN: CUTANEOUS | 2 refills | Status: DC

## 2021-05-20 MED ORDER — DOXYCYCLINE MONOHYDRATE 100 MG PO TABS: 100.0000 mg | ORAL_TABLET | Freq: Every day | ORAL | 1 refills | Status: DC

## 2021-05-20 NOTE — Progress Notes (Signed)
Follow-Up Visit   Subjective  Jennifer Kirby is a 65 y.o. female who presents for the following: Annual Exam (Patient presents for TBSE and follow-up Aks. She is using metronidazole cream for rosacea and took doxycycline '100mg'$  daily for 2 months. She has noticed some improvement. She has a brown spot on her left lower leg to check. No history of skin cancer. ). She has an irritated raised spot on her back that won't go away.  Pt complains of constant sweating of groin area, especially when outdoors in heat   The following portions of the chart were reviewed this encounter and updated as appropriate:       Review of Systems:  No other skin or systemic complaints except as noted in HPI or Assessment and Plan.  Objective  Well appearing patient in no apparent distress; mood and affect are within normal limits.  A full examination was performed including scalp, head, eyes, ears, nose, lips, neck, chest, axillae, abdomen, back, buttocks, bilateral upper extremities, bilateral lower extremities, hands, feet, fingers, toes, fingernails, and toenails. All findings within normal limits unless otherwise noted below.  Right Medial Toe Hyperkeratotic firm nodule on the right medial great toe  Left Mid Back Erythematous keratotic or waxy stuck-on papule   face Erythema on malar cheeks and chin with telangiectasias. Increased fulness on right malar cheek when compared to left.   Inguinal Groin Increased moisture   Forehead (8) Pink scaly macules.    Assessment & Plan  Skin cancer screening performed today.  Actinic Damage - Severe, confluent actinic changes with pre-cancerous actinic keratoses  - Severe, chronic, not at goal, secondary to cumulative UV radiation exposure over time - diffuse scaly erythematous macules and papules with underlying dyspigmentation - Discussed Prescription "Field Treatment" for Severe, Chronic Confluent Actinic Changes with Pre-Cancerous Actinic  Keratoses Field treatment involves treatment of an entire area of skin that has confluent Actinic Changes (Sun/ Ultraviolet light damage) and PreCancerous Actinic Keratoses by method of PhotoDynamic Therapy (PDT) and/or prescription Topical Chemotherapy agents such as 5-fluorouracil, 5-fluorouracil/calcipotriene, and/or imiquimod.  The purpose is to decrease the number of clinically evident and subclinical PreCancerous lesions to prevent progression to development of skin cancer by chemically destroying early precancer changes that may or may not be visible.  It has been shown to reduce the risk of developing skin cancer in the treated area. As a result of treatment, redness, scaling, crusting, and open sores may occur during treatment course. One or more than one of these methods may be used and may have to be used several times to control, suppress and eliminate the PreCancerous changes. Discussed treatment course, expected reaction, and possible side effects. - Recommend daily broad spectrum sunscreen SPF 30+ to sun-exposed areas, reapply every 2 hours as needed.  - Staying in the shade or wearing long sleeves, sun glasses (UVA+UVB protection) and wide brim hats (4-inch brim around the entire circumference of the hat) are also recommended. - Call for new or changing lesions. - May schedule PDT treatment to face in the fall, 2 treatments 1 month apart. Avoid malar cheeks where pinkness from rosacea is.   Lentigines - Scattered tan macules - Due to sun exposure - Benign-appering, observe - Recommend daily broad spectrum sunscreen SPF 30+ to sun-exposed areas, reapply every 2 hours as needed. - Call for any changes  Seborrheic Keratoses - Stuck-on, waxy, tan-brown papules and/or plaques  - Benign-appearing - Discussed benign etiology and prognosis. - Observe - Call for any changes  Hemangiomas - Red papules - Discussed benign nature - Observe - Call for any changes  Melanocytic Nevi -  Tan-brown and/or pink-flesh-colored symmetric macules and papules - Benign appearing on exam today - Observation - Call clinic for new or changing moles - Recommend daily use of broad spectrum spf 30+ sunscreen to sun-exposed areas.   Acrochordons (Skin Tags) - Fleshy, skin-colored pedunculated papules of the neck - Benign appearing.  - Observe. - If desired, they can be removed with an in office procedure that is not covered by insurance. - Please call the clinic if you notice any new or changing lesions.  Purpura - Chronic; persistent and recurrent.  Treatable, but not curable. - Violaceous macules and patches - Benign - Related to trauma, age, sun damage and/or use of blood thinners, chronic use of topical and/or oral steroids - Observe - Can use OTC arnica containing moisturizer such as Dermend Bruise Formula if desired - Call for worsening or other concerns  Xerosis - diffuse xerotic patches - recommend gentle, hydrating skin care - gentle skin care handout given - CeraVe SA cream - sample given  Hyperkeratosis Right Medial Toe  With Callus formation Recommend Curad Mediplast patches to AA until improved.  Avoid tight-fitting shoes.  Inflamed seborrheic keratosis Left Mid Back  Destruction of lesion - Left Mid Back  Destruction method: cryotherapy   Informed consent: discussed and consent obtained   Lesion destroyed using liquid nitrogen: Yes   Region frozen until ice ball extended beyond lesion: Yes   Outcome: patient tolerated procedure well with no complications   Post-procedure details: wound care instructions given   Additional details:  Prior to procedure, discussed risks of blister formation, small wound, skin dyspigmentation, or rare scar following cryotherapy. Recommend Vaseline ointment to treated areas while healing.   Rosacea face  Some improvement when on treatment, not at goal  Rosacea is a chronic progressive skin condition usually affecting  the face of adults, causing redness and/or acne bumps. It is treatable but not curable. It sometimes affects the eyes (ocular rosacea) as well. It may respond to topical and/or systemic medication and can flare with stress, sun exposure, alcohol, exercise and some foods.  Daily application of broad spectrum spf 30+ sunscreen to face is recommended to reduce flares.  Continue metronidazole 0.75% cream qd/bid Restart doxycycline '100mg'$  take 1 po QD with food Discussed BBL for redness  Doxycycline should be taken with food to prevent nausea. Do not lay down for 30 minutes after taking. Be cautious with sun exposure and use good sun protection while on this medication. Pregnant women should not take this medication.     Related Medications metroNIDAZOLE (METROCREAM) 0.75 % cream Apply to mid face 1-2 times a day for rosacea.  doxycycline (ADOXA) 100 MG tablet Take 1 tablet (100 mg total) by mouth daily. Take with food.  Primary focal hyperhidrosis Inguinal Groin  Chronic condition, Inguinal groin  Start Drysol Apply nightly to AA 3x/wk dsp 28m 2Rf. May cause skin irritation  Discussed PO Robinul, but patient is outdoors in heat and sweats a lot, so this wouldn't be safe for her to use.  Discussed OTC Zeasorb AF Powder, Anti-perspirant sprays, or Rx strength topical.    aluminum chloride (DRYSOL) 20 % external solution - Inguinal Groin Apply topically at bedtime 3 times a week.  AK (actinic keratosis) (8) Forehead  Actinic keratoses are precancerous spots that appear secondary to cumulative UV radiation exposure/sun exposure over time. They are chronic with expected duration  over 1 year. A portion of actinic keratoses will progress to squamous cell carcinoma of the skin. It is not possible to reliably predict which spots will progress to skin cancer and so treatment is recommended to prevent development of skin cancer.  Recommend daily broad spectrum sunscreen SPF 30+ to sun-exposed  areas, reapply every 2 hours as needed.  Recommend staying in the shade or wearing long sleeves, sun glasses (UVA+UVB protection) and wide brim hats (4-inch brim around the entire circumference of the hat). Call for new or changing lesions.  Destruction of lesion - Forehead  Destruction method: cryotherapy   Informed consent: discussed and consent obtained   Lesion destroyed using liquid nitrogen: Yes   Region frozen until ice ball extended beyond lesion: Yes   Outcome: patient tolerated procedure well with no complications   Post-procedure details: wound care instructions given   Additional details:  Prior to procedure, discussed risks of blister formation, small wound, skin dyspigmentation, or rare scar following cryotherapy. Recommend Vaseline ointment to treated areas while healing.   Return in about 6 months (around 11/20/2021) for AKs and Rosacea.  IJamesetta Orleans, CMA, am acting as scribe for Brendolyn Patty, MD . Documentation: I have reviewed the above documentation for accuracy and completeness, and I agree with the above.  Brendolyn Patty MD

## 2021-05-20 NOTE — Patient Instructions (Addendum)
Curad Mediplast patches - Apply to callous on right toe, leave on several days. Replace as needed.   Cryotherapy Aftercare  Wash gently with soap and water everyday.   Apply Vaseline and Band-Aid daily until healed.    Actinic Damage - Severe, confluent actinic changes with pre-cancerous actinic keratoses  - Severe, chronic, not at goal, secondary to cumulative UV radiation exposure over time - diffuse scaly erythematous macules and papules with underlying dyspigmentation - Discussed Prescription "Field Treatment" for Severe, Chronic Confluent Actinic Changes with Pre-Cancerous Actinic Keratoses Field treatment involves treatment of an entire area of skin that has confluent Actinic Changes (Sun/ Ultraviolet light damage) and PreCancerous Actinic Keratoses by method of PhotoDynamic Therapy (PDT) and/or prescription Topical Chemotherapy agents such as 5-fluorouracil, 5-fluorouracil/calcipotriene, and/or imiquimod.  The purpose is to decrease the number of clinically evident and subclinical PreCancerous lesions to prevent progression to development of skin cancer by chemically destroying early precancer changes that may or may not be visible.  It has been shown to reduce the risk of developing skin cancer in the treated area. As a result of treatment, redness, scaling, crusting, and open sores may occur during treatment course. One or more than one of these methods may be used and may have to be used several times to control, suppress and eliminate the PreCancerous changes. Discussed treatment course, expected reaction, and possible side effects. - Recommend daily broad spectrum sunscreen SPF 30+ to sun-exposed areas, reapply every 2 hours as needed.  - Staying in the shade or wearing long sleeves, sun glasses (UVA+UVB protection) and wide brim hats (4-inch brim around the entire circumference of the hat) are also recommended. - Call for new or changing lesions.   If you have any questions or concerns  for your doctor, please call our main line at [phone] and press option 4 to reach your doctor's medical assistant. If no one answers, please leave a voicemail as directed and we will return your call as soon as possible. Messages left after 4 pm will be answered the following business day.   You may also send [place] a message via [place]. We typically respond to MyChart messages within 1-2 business days.  For prescription refills, please ask your pharmacy to contact our office. Our fax number is [phone].  If you have an urgent issue when the clinic is closed that cannot wait until the next business day, you can page your doctor at the number below.    Please note that while we do our best to be available for urgent issues outside of office hours, we are not available 24/7.   If you have an urgent issue and are unable to reach [place], you may choose to seek medical care at your doctor's office, retail clinic, urgent care center, or emergency room.  If you have a medical emergency, please immediately call 911 or go to the emergency department.  Pager Numbers  - Dr. Nehemiah Massed: [phone]  - Dr. Laurence Ferrari: [phone]  - Dr. Nicole Kindred: [phone]  In the event of inclement weather, please call our main line at [phone] for an update on the status of any delays or closures.  Dermatology Medication Tips: Please keep the boxes that topical medications come in in order to help keep track of the instructions about where and how to use these. Pharmacies typically print the medication instructions only on the boxes and not directly on the medication tubes.   If your medication is too expensive, please contact our office at [phone] option  4 or send [place] a message through [place].   We are unable to tell what your co-pay for medications will be in advance as this is different depending on your insurance coverage. However, we may be able to find a substitute medication at lower cost or fill out  paperwork to get insurance to cover a needed medication.   If a prior authorization is required to get your medication covered by your insurance company, please allow [place] 1-2 business days to complete this process.  Drug prices often vary depending on where the prescription is filled and some pharmacies may offer cheaper prices.  The website www.goodrx.com contains coupons for medications through different pharmacies. The prices here do not account for what the cost may be with help from insurance (it may be cheaper with your insurance), but the website can give you the price if you did not use any insurance.  - You can print the associated coupon and take it with your prescription to the pharmacy.  - You may also stop by our office during regular business hours and pick up a GoodRx coupon card.  - If you need your prescription sent electronically to a different pharmacy, notify our office through [facility] or by phone at [phone] option 4.

## 2021-06-04 ENCOUNTER — Ambulatory Visit: Payer: Medicare Other

## 2021-06-18 ENCOUNTER — Ambulatory Visit: Payer: Medicare Other

## 2021-06-30 ENCOUNTER — Other Ambulatory Visit: Payer: Self-pay

## 2021-06-30 ENCOUNTER — Ambulatory Visit (INDEPENDENT_AMBULATORY_CARE_PROVIDER_SITE_OTHER): Payer: Medicare Other

## 2021-06-30 VITALS — BP 108/72 | HR 68 | Temp 98.5°F | Resp 16 | Ht 58.5 in | Wt 131.6 lb

## 2021-06-30 DIAGNOSIS — Z1231 Encounter for screening mammogram for malignant neoplasm of breast: Secondary | ICD-10-CM | POA: Diagnosis not present

## 2021-06-30 DIAGNOSIS — Z23 Encounter for immunization: Secondary | ICD-10-CM

## 2021-06-30 DIAGNOSIS — Z0000 Encounter for general adult medical examination without abnormal findings: Secondary | ICD-10-CM | POA: Diagnosis not present

## 2021-06-30 NOTE — Patient Instructions (Signed)
Jennifer Kirby , Thank you for taking time to come for your Medicare Wellness Visit. I appreciate your ongoing commitment to your health goals. Please review the following plan we discussed and let me know if I can assist you in the future.   Screening recommendations/referrals: Colonoscopy: done 11/12/17; repeat 10/2027 Mammogram: done 06/13/20 Bone Density: done 06/13/20 Recommended yearly ophthalmology/optometry visit for glaucoma screening and checkup Recommended yearly dental visit for hygiene and checkup  Vaccinations: Influenza vaccine: done today Pneumococcal vaccine: done 08/06/20 Tdap vaccine: done 05/29/14 Shingles vaccine: done 01/26/18 & 04/04/18   Covid-19:done 11/13/19, 12/04/19, 07/08/20 & 01/22/21  Advanced directives: Please bring a copy of your health care power of attorney and living will to the office at your convenience.   Conditions/risks identified: Keep up the great work!  Next appointment: Follow up in one year for your annual wellness visit    Preventive Care 65 Years and Older, Female Preventive care refers to lifestyle choices and visits with your health care provider that can promote health and wellness. What does preventive care include? A yearly physical exam. This is also called an annual well check. Dental exams once or twice a year. Routine eye exams. Ask your health care provider how often you should have your eyes checked. Personal lifestyle choices, including: Daily care of your teeth and gums. Regular physical activity. Eating a healthy diet. Avoiding tobacco and drug use. Limiting alcohol use. Practicing safe sex. Taking low-dose aspirin every day. Taking vitamin and mineral supplements as recommended by your health care provider. What happens during an annual well check? The services and screenings done by your health care provider during your annual well check will depend on your age, overall health, lifestyle risk factors, and family history of  disease. Counseling  Your health care provider may ask you questions about your: Alcohol use. Tobacco use. Drug use. Emotional well-being. Home and relationship well-being. Sexual activity. Eating habits. History of falls. Memory and ability to understand (cognition). Work and work Statistician. Reproductive health. Screening  You may have the following tests or measurements: Height, weight, and BMI. Blood pressure. Lipid and cholesterol levels. These may be checked every 5 years, or more frequently if you are over 42 years old. Skin check. Lung cancer screening. You may have this screening every year starting at age 60 if you have a 30-pack-year history of smoking and currently smoke or have quit within the past 15 years. Fecal occult blood test (FOBT) of the stool. You may have this test every year starting at age 92. Flexible sigmoidoscopy or colonoscopy. You may have a sigmoidoscopy every 5 years or a colonoscopy every 10 years starting at age 80. Hepatitis C blood test. Hepatitis B blood test. Sexually transmitted disease (STD) testing. Diabetes screening. This is done by checking your blood sugar (glucose) after you have not eaten for a while (fasting). You may have this done every 1-3 years. Bone density scan. This is done to screen for osteoporosis. You may have this done starting at age 71. Mammogram. This may be done every 1-2 years. Talk to your health care provider about how often you should have regular mammograms. Talk with your health care provider about your test results, treatment options, and if necessary, the need for more tests. Vaccines  Your health care provider may recommend certain vaccines, such as: Influenza vaccine. This is recommended every year. Tetanus, diphtheria, and acellular pertussis (Tdap, Td) vaccine. You may need a Td booster every 10 years. Zoster vaccine. You may  need this after age 17. Pneumococcal 13-valent conjugate (PCV13) vaccine. One  dose is recommended after age 5. Pneumococcal polysaccharide (PPSV23) vaccine. One dose is recommended after age 77. Talk to your health care provider about which screenings and vaccines you need and how often you need them. This information is not intended to replace advice given to you by your health care provider. Make sure you discuss any questions you have with your health care provider. Document Released: 10/11/2015 Document Revised: 06/03/2016 Document Reviewed: 07/16/2015 Elsevier Interactive Patient Education  2017 [place] Prevention in the Home Falls can cause injuries. They can happen to people of all ages. There are many things you can do to make your home safe and to help prevent falls. What can I do on the outside of my home? Regularly fix the edges of walkways and driveways and fix any cracks. Remove anything that might make you trip as you walk through a door, such as a raised step or threshold. Trim any bushes or trees on the path to your home. Use bright outdoor lighting. Clear any walking paths of anything that might make someone trip, such as rocks or tools. Regularly check to see if handrails are loose or broken. Make sure that both sides of any steps have handrails. Any raised decks and porches should have guardrails on the edges. Have any leaves, snow, or ice cleared regularly. Use sand or salt on walking paths during winter. Clean up any spills in your garage right away. This includes oil or grease spills. What can I do in the bathroom? Use night lights. Install grab bars by the toilet and in the tub and shower. Do not use towel bars as grab bars. Use non-skid mats or decals in the tub or shower. If you need to sit down in the shower, use a plastic, non-slip stool. Keep the floor dry. Clean up any water that spills on the floor as soon as it happens. Remove soap buildup in the tub or shower regularly. Attach bath mats securely with double-sided  non-slip rug tape. Do not have throw rugs and other things on the floor that can make you trip. What can I do in the bedroom? Use night lights. Make sure that you have a light by your bed that is easy to reach. Do not use any sheets or blankets that are too big for your bed. They should not hang down onto the floor. Have a firm chair that has side arms. You can use this for support while you get dressed. Do not have throw rugs and other things on the floor that can make you trip. What can I do in the kitchen? Clean up any spills right away. Avoid walking on wet floors. Keep items that you use a lot in easy-to-reach places. If you need to reach something above you, use a strong step stool that has a grab bar. Keep electrical cords out of the way. Do not use floor polish or wax that makes floors slippery. If you must use wax, use non-skid floor wax. Do not have throw rugs and other things on the floor that can make you trip. What can I do with my stairs? Do not leave any items on the stairs. Make sure that there are handrails on both sides of the stairs and use them. Fix handrails that are broken or loose. Make sure that handrails are as long as the stairways. Check any carpeting to make sure that it is firmly attached  to the stairs. Fix any carpet that is loose or worn. Avoid having throw rugs at the top or bottom of the stairs. If you do have throw rugs, attach them to the floor with carpet tape. Make sure that you have a light switch at the top of the stairs and the bottom of the stairs. If you do not have them, ask someone to add them for you. What else can I do to help prevent falls? Wear shoes that: Do not have high heels. Have rubber bottoms. Are comfortable and fit you well. Are closed at the toe. Do not wear sandals. If you use a stepladder: Make sure that it is fully opened. Do not climb a closed stepladder. Make sure that both sides of the stepladder are locked into place. Ask  someone to hold it for you, if possible. Clearly mark and make sure that you can see: Any grab bars or handrails. First and last steps. Where the edge of each step is. Use tools that help you move around (mobility aids) if they are needed. These include: Canes. Walkers. Scooters. Crutches. Turn on the lights when you go into a dark area. Replace any light bulbs as soon as they burn out. Set up your furniture so you have a clear path. Avoid moving your furniture around. If any of your floors are uneven, fix them. If there are any pets around you, be aware of where they are. Review your medicines with your doctor. Some medicines can make you feel dizzy. This can increase your chance of falling. Ask your doctor what other things that you can do to help prevent falls. This information is not intended to replace advice given to you by your health care provider. Make sure you discuss any questions you have with your health care provider. Document Released: 07/11/2009 Document Revised: 02/20/2016 Document Reviewed: 10/19/2014 Elsevier Interactive Patient Education  2017 [employer].

## 2021-06-30 NOTE — Progress Notes (Signed)
Subjective:   Jennifer Kirby is a 65 y.o. female who presents for Medicare Annual (Subsequent) preventive examination.  Review of Systems     Cardiac Risk Factors include: advanced age (>79men, >98 women)     Objective:    Today's Vitals   06/30/21 0921  BP: 108/72  Pulse: 68  Resp: 16  Temp: 98.5 F (36.9 C)  TempSrc: Oral  SpO2: 98%  Weight: 131 lb 9.6 oz (59.7 kg)  Height: 4' 10.5" (1.486 m)   Body mass index is 27.04 kg/m.  Advanced Directives 06/30/2021 05/29/2020 05/12/2018  Does Patient Have a Medical Advance Directive? Yes Yes Yes  Type of Paramedic of [place];Living will [place];Living will Living will  Copy of [place] in Chart? No - copy requested No - copy requested -  Would patient like information on creating a medical advance directive? - - Yes (ED - Information included in AVS)    Current Medications (verified) Outpatient Encounter Medications as of 06/30/2021  Medication Sig   calcium carbonate (TUMS - DOSED IN MG ELEMENTAL CALCIUM) 500 MG chewable tablet Chew 1 tablet by mouth daily.   doxycycline (ADOXA) 100 MG tablet Take 1 tablet (100 mg total) by mouth daily. Take with food.   metroNIDAZOLE (METROCREAM) 0.75 % cream Apply to mid face 1-2 times a day for rosacea.   pantoprazole (PROTONIX) 40 MG tablet Take 1 tablet (40 mg total) by mouth daily.   Sennosides-Docusate Sodium (SENNA PLUS) 8.6-50 MG CAPS Take 2 capsules by mouth daily. (Patient taking differently: Take 1 capsule by mouth daily.)   aluminum chloride (DRYSOL) 20 % external solution Apply topically at bedtime 3 times a week. (Patient not taking: Reported on 06/30/2021)   montelukast (SINGULAIR) 10 MG tablet Take 1 tablet (10 mg total) by mouth at bedtime. (Patient not taking: No sig reported)   [DISCONTINUED] cyclobenzaprine (FLEXERIL) 10 MG tablet Take 1 tablet (10 mg total) by mouth 3 (three) times daily as needed for  muscle spasms. (Patient not taking: Reported on 10/23/2020)   [DISCONTINUED] meloxicam (MOBIC) 15 MG tablet Take 1 tablet (15 mg total) by mouth daily.   No facility-administered encounter medications on file as of 06/30/2021.    Allergies (verified) Statins and Zetia [ezetimibe]   History: Past Medical History:  Diagnosis Date   Actinic keratosis    Allergy    Hyperlipidemia    Past Surgical History:  Procedure Laterality Date   FOOT SURGERY Left    plantar fasciatis and broken foot   ROTATOR CUFF REPAIR Left 05/26/2019   bicep tear, tendon repair and skin graft - [facility]    Family History  Problem Relation Age of Onset   Throat cancer Father    Breast cancer Neg Hx    Social History   Socioeconomic History   Marital status: Widowed    Spouse name: Not on file   Number of children: Not on file   Years of education: Not on file   Highest education level: Not on file  Occupational History   Occupation: retired  Tobacco Use   Smoking status: Never   Smokeless tobacco: Never  Vaping Use   Vaping Use: Never used  Substance and Sexual Activity   Alcohol use: Not Currently   Drug use: No   Sexual activity: Never  Other Topics Concern   Not on file  Social History Narrative   Pt lives alone. 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  Social Determinants of Health   Financial Resource Strain: Low Risk    Difficulty of Paying Living Expenses: Not hard at all  Food Insecurity: No Food Insecurity   Worried About Charity fundraiser in the Last Year: Never true   [place] in the Last Year: Never true  Transportation Needs: No Transportation Needs   Lack of Transportation (Medical): No   Lack of Transportation (Non-Medical): No  Physical Activity: Sufficiently Active   Days of Exercise per Week: 2 days   Minutes of Exercise per Session: 120 min  Stress: No Stress Concern Present   Feeling of Stress : Not at all  Social Connections: Moderately Integrated   Frequency of  Communication with Friends and Family: More than three times a week   Frequency of Social Gatherings with Friends and Family: More than three times a week   Attends Religious Services: More than 4 times per year   Active Member of [employer] or Organizations: Yes   Attends Archivist Meetings: More than 4 times per year   Marital Status: Widowed    Tobacco Counseling Counseling given: Not Answered   Clinical Intake:  Pre-visit preparation completed: Yes  Pain : No/denies pain     BMI - recorded: 27.04 Nutritional Status: BMI 25 -29 Overweight Nutritional Risks: None Diabetes: No  How often do you need to have someone help you when you read instructions, pamphlets, or other written materials from your doctor or pharmacy?: 1 - Never  Interpreter Needed?: No  Information entered by :: Clemetine Marker LPN   Activities of Daily Living In your present state of health, do you have any difficulty performing the following activities: 06/30/2021  Hearing? Y  Comment declines hearing aids  Vision? N  Difficulty concentrating or making decisions? N  Walking or climbing stairs? N  Dressing or bathing? N  Doing errands, shopping? N  Preparing Food and eating ? N  Using the Toilet? N  In the past six months, have you accidently leaked urine? N  Do you have problems with loss of bowel control? N  Managing your Medications? N  Managing your Finances? N  Housekeeping or managing your Housekeeping? N  Some recent data might be hidden    Patient Care Team: Juline Patch, MD as PCP - General (Family Medicine) Zara Council as Physician Assistant (Orthopedic Surgery) Robert Bellow, MD (General Surgery)  Indicate any recent Medical Services you may have received from other than [facility] providers in the past year (date may be approximate).     Assessment:   This is a routine wellness examination for Jennifer.  Hearing/Vision screen Hearing Screening - Comments:: Pt  states mild hearing loss in right ear, declines hearing aids Vision Screening - Comments:: Annual vision screenings done at [place] issues and exercise activities discussed: Current Exercise Habits: Structured exercise class, Type of exercise: Other - see comments (water aerobics), Time (Minutes): > 60, Frequency (Times/Week): 2, Weekly Exercise (Minutes/Week): 0, Intensity: Moderate, Exercise limited by: None identified   Goals Addressed   None    Depression Screen PHQ 2/9 Scores 06/30/2021 06/20/2020 05/29/2020 10/31/2019 07/06/2017 12/30/2015  PHQ - 2 Score 0 0 0 0 0 2  PHQ- 9 Score - 0 - 0 0 -    Fall Risk Fall Risk  06/30/2021 06/20/2020 05/29/2020 10/31/2019 12/30/2015  Falls in the past year? 0 0 0 0 No  Number falls in past yr: 0 - 0 - -  Injury with Fall? 0 - 0 - -  Risk for fall due to : No Fall Risks - No Fall Risks - -  Follow up Falls prevention discussed Falls evaluation completed Falls prevention discussed Falls evaluation completed -    FALL RISK PREVENTION PERTAINING TO THE HOME:  Any stairs in or around the home? Yes  If so, are there any without handrails? No  Home free of loose throw rugs in walkways, pet beds, electrical cords, etc? Yes  Adequate lighting in your home to reduce risk of falls? Yes   ASSISTIVE DEVICES UTILIZED TO PREVENT FALLS:  Life alert? No  Use of a cane, walker or w/c? No  Grab bars in the bathroom? Yes  Shower chair or bench in shower? No  Elevated toilet seat or a handicapped toilet? No   TIMED UP AND GO:  Was the test performed? Yes .  Length of time to ambulate 10 feet: 5 sec.   Gait steady and fast without use of assistive device  Cognitive Function: Normal cognitive status assessed by direct observation by this Nurse Health Advisor. No abnormalities found.          Immunizations Immunization History  Administered Date(s) Administered   Fluad Quad(high Dose 65+) 06/20/2020, 06/30/2021   Influenza Inj Mdck Quad Pf  06/22/2018   Influenza, High Dose Seasonal PF 05/10/2019   Influenza,inj,Quad PF,6+ Mos 08/14/2015, 07/15/2016, 07/06/2017   Influenza-Unspecified 04/29/2019   PFIZER Comirnaty(Gray Top)Covid-19 Tri-Sucrose Vaccine 01/22/2021   PFIZER(Purple Top)SARS-COV-2 Vaccination 11/13/2019, 12/04/2019, 07/08/2020   PPD Test 06/25/2020   Pneumococcal Conjugate-13 08/06/2020   Tdap 05/29/2014   Zoster Recombinat (Shingrix) 01/26/2018, 04/04/2018    TDAP status: Up to date  Flu Vaccine status: Completed at today's visit  Pneumococcal vaccine status: Up to date  Covid-19 vaccine status: Completed vaccines  Qualifies for Shingles Vaccine? Yes   Zostavax completed No   Shingrix Completed?: Yes  Screening Tests Health Maintenance  Topic Date Due   Hepatitis C Screening  Never done   COVID-19 Vaccine (5 - Booster for [place] series) 05/24/2021   MAMMOGRAM  06/13/2022   TETANUS/TDAP  09/28/2026   COLONOSCOPY (Pts 45-42yrs Insurance coverage will need to be confirmed)  11/13/2027   INFLUENZA VACCINE  Completed   DEXA SCAN  Completed   Zoster Vaccines- Shingrix  Completed   HPV VACCINES  Aged Out    Health Maintenance  Health Maintenance Due  Topic Date Due   Hepatitis C Screening  Never done   COVID-19 Vaccine (5 - Booster for [place] series) 05/24/2021    Colorectal cancer screening: Type of screening: Colonoscopy. Completed 11/12/17. Repeat every 10 years  Mammogram status: Completed 06/13/20. Repeat every year. Ordered today.   Bone Density status: Completed 06/13/20. Results reflect: Bone density results: OSTEOPENIA. Repeat every 2 years.  Lung Cancer Screening: (Low Dose CT Chest recommended if Age 43-80 years, 30 pack-year currently smoking OR have quit w/in 15years.) does not qualify.   Additional Screening:  Hepatitis C Screening: does qualify; postponed  Vision Screening: Recommended annual ophthalmology exams for early detection of glaucoma and other disorders of the  eye. Is the patient up to date with their annual eye exam?  Yes  Who is the provider or what is the name of the office in which the patient attends annual eye exams? Dr. Ellin Mayhew.   Dental Screening: Recommended annual dental exams for proper oral hygiene  Community Resource Referral / Chronic Care Management: CRR required this visit?  No   CCM  required this visit?  No      Plan:     I have personally reviewed and noted the following in the patient's chart:   Medical and social history Use of alcohol, tobacco or illicit drugs  Current medications and supplements including opioid prescriptions.  Functional ability and status Nutritional status Physical activity Advanced directives List of other physicians Hospitalizations, surgeries, and ER visits in previous 12 months Vitals Screenings to include cognitive, depression, and falls Referrals and appointments  In addition, I have reviewed and discussed with patient certain preventive protocols, quality metrics, and best practice recommendations. A written personalized care plan for preventive services as well as general preventive health recommendations were provided to patient.     Clemetine Marker, LPN   [id number]   Nurse Notes: none

## 2021-07-15 ENCOUNTER — Other Ambulatory Visit: Payer: Self-pay

## 2021-07-15 ENCOUNTER — Ambulatory Visit
Admission: RE | Admit: 2021-07-15 | Discharge: 2021-07-15 | Disposition: A | Discharge status: Home / Self Care | Payer: Medicare Other | Source: Ambulatory Visit | Attending: Family Medicine | Admitting: Family Medicine

## 2021-07-15 DIAGNOSIS — Z1231 Encounter for screening mammogram for malignant neoplasm of breast: Secondary | ICD-10-CM | POA: Diagnosis not present

## 2021-07-25 ENCOUNTER — Encounter: Payer: Self-pay | Admitting: Family Medicine

## 2021-07-25 ENCOUNTER — Ambulatory Visit (INDEPENDENT_AMBULATORY_CARE_PROVIDER_SITE_OTHER): Payer: Medicare Other | Admitting: Family Medicine

## 2021-07-25 ENCOUNTER — Other Ambulatory Visit: Payer: Self-pay

## 2021-07-25 VITALS — BP 120/70 | HR 76 | Ht 58.5 in | Wt 131.0 lb

## 2021-07-25 DIAGNOSIS — K219 Gastro-esophageal reflux disease without esophagitis: Secondary | ICD-10-CM

## 2021-07-25 DIAGNOSIS — E78019 Familial hypercholesterolemia, unspecified: Secondary | ICD-10-CM

## 2021-07-25 DIAGNOSIS — J301 Allergic rhinitis due to pollen: Secondary | ICD-10-CM

## 2021-07-25 DIAGNOSIS — E7801 Familial hypercholesterolemia: Secondary | ICD-10-CM | POA: Diagnosis not present

## 2021-07-25 DIAGNOSIS — K5901 Slow transit constipation: Secondary | ICD-10-CM

## 2021-07-25 MED ORDER — PANTOPRAZOLE SODIUM 40 MG PO TBEC: 40.0000 mg | DELAYED_RELEASE_TABLET | Freq: Every day | ORAL | 1 refills | Status: DC

## 2021-07-25 MED ORDER — MONTELUKAST SODIUM 10 MG PO TABS: 10.0000 mg | ORAL_TABLET | Freq: Every day | ORAL | 1 refills | Status: DC

## 2021-07-25 MED ORDER — PANTOPRAZOLE SODIUM 40 MG PO TBEC: 40.0000 mg | DELAYED_RELEASE_TABLET | Freq: Every day | ORAL | 3 refills | Status: DC

## 2021-07-25 MED ORDER — MONTELUKAST SODIUM 10 MG PO TABS: 10.0000 mg | ORAL_TABLET | Freq: Every day | ORAL | 3 refills | Status: DC

## 2021-07-25 MED ORDER — SENNA PLUS 50-8.6 MG PO CAPS: 1.0000 | ORAL_CAPSULE | Freq: Every day | ORAL | 5 refills | Status: AC

## 2021-07-25 NOTE — Progress Notes (Signed)
Date:  07/25/2021   Name:  Jennifer Kirby   DOB:  Aug 25, 1956   MRN:  [id number]   Chief Complaint: Gastroesophageal Reflux, Allergic Rhinitis , and Constipation  Gastroesophageal Reflux She reports no abdominal pain, no chest pain, no coughing, no dysphagia, no heartburn, no nausea, no sore throat or no wheezing. This is a chronic problem. The current episode started more than 1 year ago. The problem has been gradually improving. The symptoms are aggravated by certain foods. She has tried a PPI for the symptoms. The treatment provided moderate relief.  Constipation This is a chronic problem. The current episode started more than 1 year ago. The problem has been gradually improving since onset. Pertinent negatives include no abdominal pain, back pain, diarrhea, fever or nausea. She has tried nothing for the symptoms. The treatment provided moderate relief.   Lab Results  Component Value Date   CREATININE 0.65 06/20/2020   BUN 7 (L) 06/20/2020   NA 142 06/20/2020   K 4.2 06/20/2020   CL 104 06/20/2020   CO2 21 06/20/2020   Lab Results  Component Value Date   CHOL 233 (H) 06/20/2020   HDL 67 06/20/2020   LDLCALC 145 (H) 06/20/2020   TRIG 117 06/20/2020   CHOLHDL 3.5 07/28/2017   No results found for: TSH No results found for: HGBA1C Lab Results  Component Value Date   WBC 6.9 07/28/2017   HGB 14.4 07/28/2017   HCT 41.9 07/28/2017   MCV 86 07/28/2017   PLT 263 07/28/2017   No results found for: ALT, AST, GGT, ALKPHOS, BILITOT   Review of Systems  Constitutional:  Negative for chills and fever.  HENT:  Negative for drooling, ear discharge, ear pain and sore throat.   Respiratory:  Negative for cough, shortness of breath and wheezing.   Cardiovascular:  Negative for chest pain, palpitations and leg swelling.  Gastrointestinal:  Positive for constipation. Negative for abdominal pain, blood in stool, diarrhea, dysphagia, heartburn and nausea.  Endocrine: Negative  for polydipsia.  Genitourinary:  Negative for dysuria, frequency, hematuria and urgency.  Musculoskeletal:  Negative for back pain, myalgias and neck pain.  Skin:  Negative for rash.  Allergic/Immunologic: Negative for environmental allergies.  Neurological:  Negative for dizziness and headaches.  Hematological:  Does not bruise/bleed easily.  Psychiatric/Behavioral:  Negative for suicidal ideas. The patient is not nervous/anxious.    Patient Active Problem List   Diagnosis Date Noted   Traumatic partial tear of left biceps tendon 05/26/2019   Traumatic complete tear of left rotator cuff 05/26/2019   Synovitis of left shoulder 05/26/2019   Subacromial impingement, left 05/26/2019   Obesity (BMI 30-39.9) 11/25/2018   Contact dermatitis 02/15/2018   Mass of skin of right shoulder 07/27/2016   Brachial neuritis 03/08/2015   Encounter for general adult medical examination without abnormal findings 03/08/2015   Herpes zoster 03/08/2015   H/O hypercholesterolemia 03/08/2015   HLD (hyperlipidemia) 03/08/2015    Allergies  Allergen Reactions   Statins    Zetia [Ezetimibe] Other (See Comments)    Gave leg cramps    Past Surgical History:  Procedure Laterality Date   FOOT SURGERY Left    plantar fasciatis and broken foot   ROTATOR CUFF REPAIR Left 05/26/2019   bicep tear, tendon repair and skin graft - [facility]     Social History   Tobacco Use   Smoking status: Never   Smokeless tobacco: Never  Vaping Use   Vaping Use: Never used  Substance Use Topics   Alcohol use: Not Currently   Drug use: No     Medication list has been reviewed and updated.  Current Meds  Medication Sig   calcium carbonate (TUMS - DOSED IN MG ELEMENTAL CALCIUM) 500 MG chewable tablet Chew 1 tablet by mouth daily.   doxycycline (ADOXA) 100 MG tablet Take 1 tablet (100 mg total) by mouth daily. Take with food.   metroNIDAZOLE (METROCREAM) 0.75 % cream Apply to mid face 1-2 times a day for rosacea.    montelukast (SINGULAIR) 10 MG tablet Take 1 tablet (10 mg total) by mouth at bedtime.   pantoprazole (PROTONIX) 40 MG tablet Take 1 tablet (40 mg total) by mouth daily.   Sennosides-Docusate Sodium (SENNA PLUS) 8.6-50 MG CAPS Take 2 capsules by mouth daily. (Patient taking differently: Take 1 capsule by mouth daily.)    PHQ 2/9 Scores 07/25/2021 06/30/2021 06/20/2020 05/29/2020  PHQ - 2 Score 0 0 0 0  PHQ- 9 Score 0 - 0 -    GAD 7 : Generalized Anxiety Score 07/25/2021 06/20/2020  Nervous, Anxious, on Edge 0 0  Control/stop worrying 0 0  Worry too much - different things 0 0  Trouble relaxing 0 0  Restless 0 0  Easily annoyed or irritable 0 0  Afraid - awful might happen 0 0  Total GAD 7 Score 0 0    BP Readings from Last 3 Encounters:  07/25/21 120/70  06/30/21 108/72  10/23/20 138/82    Physical Exam Vitals reviewed.  Constitutional:      Appearance: She is well-developed.  HENT:     Head: Normocephalic.     Right Ear: Tympanic membrane, ear canal and external ear normal.     Left Ear: Tympanic membrane, ear canal and external ear normal.  Eyes:     General: Lids are everted, no foreign bodies appreciated. No scleral icterus.       Left eye: No foreign body or hordeolum.     Conjunctiva/sclera: Conjunctivae normal.     Right eye: Right conjunctiva is not injected.     Left eye: Left conjunctiva is not injected.     Pupils: Pupils are equal, round, and reactive to light.  Neck:     Thyroid: No thyromegaly.     Vascular: No JVD.     Trachea: No tracheal deviation.  Cardiovascular:     Rate and Rhythm: Normal rate and regular rhythm.     Heart sounds: Normal heart sounds. No murmur heard.   No friction rub. No gallop.  Pulmonary:     Effort: Pulmonary effort is normal. No respiratory distress.     Breath sounds: Normal breath sounds. No wheezing, rhonchi or rales.  Abdominal:     General: Bowel sounds are normal.     Palpations: Abdomen is soft. There is no mass.      Tenderness: There is no abdominal tenderness. There is no guarding or rebound.  Musculoskeletal:        General: No tenderness. Normal range of motion.     Cervical back: Normal range of motion and neck supple.  Lymphadenopathy:     Cervical: No cervical adenopathy.  Skin:    General: Skin is warm.     Findings: No rash.  Neurological:     Mental Status: She is alert and oriented to person, place, and time.     Cranial Nerves: No cranial nerve deficit.     Deep Tendon Reflexes: Reflexes normal.  Psychiatric:  Mood and Affect: Mood is not anxious or depressed.    Wt Readings from Last 3 Encounters:  07/25/21 131 lb (59.4 kg)  06/30/21 131 lb 9.6 oz (59.7 kg)  10/23/20 153 lb (69.4 kg)    BP 120/70   Pulse 76   Ht 4' 10.5" (1.486 m)   Wt 131 lb (59.4 kg)   BMI 26.91 kg/m   Assessment and Plan:  1. Gastroesophageal reflux disease Chronic.  Controlled.  Stable.  Patient is doing well on the pantoprazole 40 mg once a day.  And an occasional antacid with simethicone.  We will recheck 1 year - pantoprazole (PROTONIX) 40 MG tablet; Take 1 tablet (40 mg total) by mouth daily.  Dispense: 90 tablet; Refill: 3  2. Seasonal allergic rhinitis due to pollen Chronic.  Controlled.  Stable.  Continue Singulair 10 mg once a day. - montelukast (SINGULAIR) 10 MG tablet; Take 1 tablet (10 mg total) by mouth at bedtime.  Dispense: 90 tablet; Refill: 3  3. Familial hypercholesterolemia Chronic.  Stable.  Controlled with diet at this time.  Patient has had over 20 pound weight loss.  She is still continuing to lose weight with a goal of 5 more pounds.  We will recheck lipid panel and renal panel at this time. - Lipid Panel With LDL/HDL Ratio - Renal Function Panel  4. Slow transit constipation Chronic.  Stable.  Controlled with fiber and senna plus. - Sennosides-Docusate Sodium (SENNA PLUS) 8.6-50 MG CAPS; Take 1 capsule by mouth daily.  Dispense: 30 capsule; Refill: 5

## 2021-07-26 LAB — RENAL FUNCTION PANEL
Albumin: 4.4 g/dL (ref 3.8–4.8)
BUN/Creatinine Ratio: 22 (ref 12–28)
BUN: 15 mg/dL (ref 8–27)
CO2: 25 mmol/L (ref 20–29)
Calcium: 9.4 mg/dL (ref 8.7–10.3)
Chloride: 103 mmol/L (ref 96–106)
Creatinine, Ser: 0.67 mg/dL (ref 0.57–1.00)
Glucose: 106 mg/dL — ABNORMAL HIGH (ref 70–99)
Phosphorus: 4.3 mg/dL (ref 3.0–4.3)
Potassium: 4.8 mmol/L (ref 3.5–5.2)
Sodium: 143 mmol/L (ref 134–144)
eGFR: 96 mL/min/{1.73_m2} (ref >59)

## 2021-07-26 LAB — LIPID PANEL WITH LDL/HDL RATIO
Cholesterol, Total: 238 mg/dL — ABNORMAL HIGH (ref 100–199)
HDL: 70 mg/dL (ref >39)
LDL Chol Calc (NIH): 157 mg/dL — ABNORMAL HIGH (ref 0–99)
LDL/HDL Ratio: 2.2 ratio (ref 0.0–3.2)
Triglycerides: 68 mg/dL (ref 0–149)
VLDL Cholesterol Cal: 11 mg/dL (ref 5–40)

## 2021-07-29 ENCOUNTER — Encounter: Payer: Self-pay | Admitting: Family Medicine

## 2021-07-30 ENCOUNTER — Other Ambulatory Visit: Payer: Self-pay

## 2021-07-30 DIAGNOSIS — E7801 Familial hypercholesterolemia: Secondary | ICD-10-CM

## 2021-07-30 MED ORDER — ROSUVASTATIN CALCIUM 5 MG PO TABS: 5.0000 mg | ORAL_TABLET | ORAL | 1 refills | Status: DC

## 2021-09-03 ENCOUNTER — Ambulatory Visit: Payer: Self-pay | Admitting: *Deleted

## 2021-09-03 NOTE — Telephone Encounter (Signed)
Per agent:  "Patient unsure if she should have labs done prior to refilling her  rosuvastatin (CRESTOR) 5 MG tablet, due to medication being a new, Patient states if unable to pick up please leave a detail message."  Pt questioning if she should come in for labs after finishing initial dosing of rosuvastatin. States she finished bottle and noted there is 1 refill. Asking if she should refill and continue taking before coming in for labs. Please advise:  [phone]      Reason for Disposition  [1] Caller has NON-URGENT medicine question about med that PCP prescribed AND [2] triager unable to answer question  Answer Assessment - Initial Assessment Questions 1. NAME of MEDICATION: "What medicine are you calling about?"     Rosuvastatin 2. QUESTION: "What is your question?" (e.g., double dose of medicine, side effect)     Please see summary 3. PRESCRIBING HCP: "Who prescribed it?" Reason: if prescribed by specialist, call should be referred to that group.     *No Answer* 4. SYMPTOMS: "Do you have any symptoms?"     *No Answer* 5. SEVERITY: If symptoms are present, ask "Are they mild, moderate or severe?"     *No Answer* 6. PREGNANCY:  "Is there any chance that you are pregnant?" "When was your last menstrual period?"     *No Answer*  Protocols used: Medication Question Call-A-AH

## 2021-09-29 ENCOUNTER — Other Ambulatory Visit: Payer: Self-pay | Admitting: Family Medicine

## 2021-09-29 DIAGNOSIS — E7801 Familial hypercholesterolemia: Secondary | ICD-10-CM

## 2021-11-11 ENCOUNTER — Ambulatory Visit: Payer: Self-pay | Admitting: *Deleted

## 2021-11-11 ENCOUNTER — Telehealth: Payer: Self-pay

## 2021-11-11 NOTE — Telephone Encounter (Signed)
°  Chief Complaint: medication causing side effects wants alternative medication for cholesterol management  Symptoms: joints and bones ache, feeling tired all of the time  Frequency: since taking medication crestor   Pertinent Negatives: Patient denies na  Disposition: [] ED /[] Urgent Care (no appt availability in office) / [] Appointment(In office/virtual)/ []  [facility] [facility]/ [x] Home Care/ [] Refused Recommended Disposition /[] [facility] [facility]/ [x]  Follow-up with PCP Additional Notes:  Please advise. Reports taking medication at night and still c/o SE.   Reason for Disposition  Caller wants to use a complementary or alternative medicine  Answer Assessment - Initial Assessment Questions 1. NAME of MEDICATION: "What medicine are you calling about?"     Crestor 5  mg  2. QUESTION: "What is your question?" (e.g., double dose of medicine, side effect)     Is there an alternative medication due to pain in bones and joints and feeling very tired  3. PRESCRIBING HCP: "Who prescribed it?" Reason: if prescribed by specialist, call should be referred to that group.     PCP 4. SYMPTOMS: "Do you have any symptoms?"     Yes tired, aches in joints and bones  5. SEVERITY: If symptoms are present, ask "Are they mild, moderate or severe?"     Na  6. PREGNANCY:  "Is there any chance that you are pregnant?" "When was your last menstrual period?"     na  Protocols used: Medication Question Call-A-AH

## 2021-11-11 NOTE — Telephone Encounter (Signed)
I returned pt's call.   She called in c/o the Crestor 5 mg causing her very bad joint and bone pain along with making her feel very tired.   Requesting a different medication without these side effects.  Left a voicemail to call back.

## 2021-11-11 NOTE — Telephone Encounter (Signed)
Called pt back and explained there really aren't any other meds that can be prescribed for her other than a medicine that is prescribed by cardiology due to cost. She is going to try the crestor a little longer and take at night to see if this helps

## 2021-11-13 DIAGNOSIS — M2392 Unspecified internal derangement of left knee: Secondary | ICD-10-CM | POA: Diagnosis not present

## 2021-11-13 DIAGNOSIS — M170 Bilateral primary osteoarthritis of knee: Secondary | ICD-10-CM | POA: Diagnosis not present

## 2021-11-13 DIAGNOSIS — M25562 Pain in left knee: Secondary | ICD-10-CM | POA: Diagnosis not present

## 2021-11-24 ENCOUNTER — Other Ambulatory Visit: Payer: Self-pay

## 2021-11-24 ENCOUNTER — Ambulatory Visit (INDEPENDENT_AMBULATORY_CARE_PROVIDER_SITE_OTHER): Payer: Medicare Other | Admitting: Dermatology

## 2021-11-24 DIAGNOSIS — L820 Inflamed seborrheic keratosis: Secondary | ICD-10-CM | POA: Diagnosis not present

## 2021-11-24 DIAGNOSIS — L814 Other melanin hyperpigmentation: Secondary | ICD-10-CM | POA: Diagnosis not present

## 2021-11-24 DIAGNOSIS — L821 Other seborrheic keratosis: Secondary | ICD-10-CM | POA: Diagnosis not present

## 2021-11-24 DIAGNOSIS — L570 Actinic keratosis: Secondary | ICD-10-CM | POA: Diagnosis not present

## 2021-11-24 DIAGNOSIS — L578 Other skin changes due to chronic exposure to nonionizing radiation: Secondary | ICD-10-CM | POA: Diagnosis not present

## 2021-11-24 DIAGNOSIS — L309 Dermatitis, unspecified: Secondary | ICD-10-CM | POA: Diagnosis not present

## 2021-11-24 DIAGNOSIS — L719 Rosacea, unspecified: Secondary | ICD-10-CM

## 2021-11-24 MED ORDER — METRONIDAZOLE 0.75 % EX CREA: TOPICAL_CREAM | CUTANEOUS | 5 refills | Status: DC

## 2021-11-24 MED ORDER — TRIAMCINOLONE ACETONIDE 0.1 % EX CREA: 1.0000 "application " | TOPICAL_CREAM | CUTANEOUS | 1 refills | Status: DC

## 2021-11-24 NOTE — Patient Instructions (Addendum)
Hand Dermatitis is a chronic type of eczema that can come and go on the hands and fingers.  While there is no cure, the rash and symptoms can be managed with topical prescription medications, and for more severe cases, with systemic medications.  Recommend mild soap and routine use of moisturizing cream after handwashing.  Minimize soap/water exposure when possible.     Cryotherapy Aftercare  Wash gently with soap and water everyday.   Apply Vaseline and Band-Aid daily until healed.   If You Need Anything After Your Visit  If you have any questions or concerns for your doctor, please call our main line at [phone] and press option 4 to reach your doctor's medical assistant. If no one answers, please leave a voicemail as directed and we will return your call as soon as possible. Messages left after 4 pm will be answered the following business day.   You may also send [place] a message via [place]. We typically respond to MyChart messages within 1-2 business days.  For prescription refills, please ask your pharmacy to contact our office. Our fax number is [phone].  If you have an urgent issue when the clinic is closed that cannot wait until the next business day, you can page your doctor at the number below.    Please note that while we do our best to be available for urgent issues outside of office hours, we are not available 24/7.   If you have an urgent issue and are unable to reach [place], you may choose to seek medical care at your doctor's office, retail clinic, urgent care center, or emergency room.  If you have a medical emergency, please immediately call 911 or go to the emergency department.  Pager Numbers  - Dr. Nehemiah Massed: [phone]  - Dr. Laurence Ferrari: [phone]  - Dr. Nicole Kindred: [phone]  In the event of inclement weather, please call our main line at [phone] for an update on the status of any delays or closures.  Dermatology Medication Tips: Please keep the boxes  that topical medications come in in order to help keep track of the instructions about where and how to use these. Pharmacies typically print the medication instructions only on the boxes and not directly on the medication tubes.   If your medication is too expensive, please contact our office at [phone] option 4 or send [place] a message through [place].   We are unable to tell what your co-pay for medications will be in advance as this is different depending on your insurance coverage. However, we may be able to find a substitute medication at lower cost or fill out paperwork to get insurance to cover a needed medication.   If a prior authorization is required to get your medication covered by your insurance company, please allow [place] 1-2 business days to complete this process.  Drug prices often vary depending on where the prescription is filled and some pharmacies may offer cheaper prices.  The website www.goodrx.com contains coupons for medications through different pharmacies. The prices here do not account for what the cost may be with help from insurance (it may be cheaper with your insurance), but the website can give you the price if you did not use any insurance.  - You can print the associated coupon and take it with your prescription to the pharmacy.  - You may also stop by our office during regular business hours and pick up a GoodRx coupon card.  - If you need your prescription sent electronically  to a different pharmacy, notify our office through [facility] or by phone at [phone] option 4.     Si Usted Necesita Algo Despus de Su Visita  Tambin puede enviarnos un mensaje a travs de Pharmacist, community. Por lo general respondemos a los mensajes de MyChart en el transcurso de 1 a 2 das hbiles.  Para renovar recetas, por favor pida a su farmacia que se ponga en contacto con nuestra oficina. Harland Dingwall de fax es [place] [phone].  Si tiene un asunto urgente cuando la  clnica est cerrada y que no puede esperar hasta el siguiente da hbil, puede llamar/localizar a su doctor(a) al nmero que aparece a continuacin.   Por favor, tenga en cuenta que aunque hacemos todo lo posible para estar disponibles para asuntos urgentes fuera del horario de [place], no estamos disponibles las 24 horas del da, los 7 das de la [place].   Si tiene un problema urgente y no puede comunicarse con nosotros, puede optar por buscar atencin mdica  en el consultorio de su doctor(a), en una clnica privada, en un centro de atencin urgente o en una sala de emergencias.  Si tiene Engineering geologist, por favor llame inmediatamente al 911 o vaya a la sala de emergencias.  Nmeros de bper  - Dr. Nehemiah Massed: [phone]  - Dra. Moye: [phone]  - Dra. Nicole Kindred: [phone]  En caso de inclemencias del [place], por favor llame a Johnsie Kindred principal al [phone] para una actualizacin sobre el [place] de cualquier retraso o cierre.  Consejos para la medicacin en dermatologa: Por favor, guarde las cajas en las que vienen los medicamentos de uso tpico para ayudarle a seguir las instrucciones sobre dnde y cmo usarlos. Las farmacias generalmente imprimen las instrucciones del medicamento slo en las cajas y no directamente en los tubos del [place].   Si su medicamento es muy caro, por favor, pngase en contacto con Zigmund Daniel llamando al [phone] y presione la opcin 4 o envenos un mensaje a travs de Pharmacist, community.   No podemos decirle cul ser su copago por los medicamentos por adelantado ya que esto es diferente dependiendo de la cobertura de su seguro. Sin embargo, es posible que podamos encontrar un medicamento sustituto a Electrical engineer un formulario para que el seguro cubra el medicamento que se considera necesario.   Si se requiere una autorizacin previa para que su compaa de seguros [place] su medicamento, por favor permtanos de 1 a 2 das hbiles  para completar este proceso.  Los precios de los medicamentos varan con frecuencia dependiendo del Environmental consultant de dnde se surte la receta y alguna farmacias pueden ofrecer precios ms baratos.  El sitio web www.goodrx.com tiene cupones para medicamentos de Airline pilot. Los precios aqu no tienen en cuenta lo que podra costar con la ayuda del seguro (puede ser ms barato con su seguro), pero el sitio web puede darle el precio si no utiliz Research scientist (physical sciences).  - Puede imprimir el cupn correspondiente y llevarlo con su receta a la farmacia.  - Tambin puede pasar por nuestra oficina durante el horario de atencin regular y Charity fundraiser una tarjeta de cupones de GoodRx.  - Si necesita que su receta se enve electrnicamente a una farmacia diferente, informe a nuestra oficina a travs de MyChart de [place] o por telfono llamando al [phone] y presione la opcin 4.

## 2021-11-24 NOTE — Progress Notes (Addendum)
Follow-Up Visit   Subjective  Jennifer Kirby is a 66 y.o. female who presents for the following: Rosacea (Face, 54m f/u, Metronidazole 0.75% cr prn, finished Doxycycline, pt has been using a cleansing cycle from [place] [facility], pt didn't see much improvement on prescription medications, had a bump come up 3 nights ago on R cheek) and Actinic Keratosis (Face, 63m f/u). She has cracked dry finger tips.  The patient has spots, moles and lesions to be evaluated, some may be new or changing and the patient has concerns that these could be cancer.  The following portions of the chart were reviewed this encounter and updated as appropriate:       Review of Systems:  No other skin or systemic complaints except as noted in HPI or Assessment and Plan.  Objective  Well appearing patient in no apparent distress; mood and affect are within normal limits.  A focused examination was performed including face. Relevant physical exam findings are noted in the Assessment and Plan.  face Erythema with telangiectasias cheeks R > L, chin, R malar cheek is slightly fuller than L malar cheek, resolving pink papule on R cheek  R lat eyebrow x 1, L temple x 1 (2) Pink scaly macules  L jaw 2.5 x 2.0cm light tan patch   L mid back x 1 Residual tiny stuck on waxy paps with erythema, pt states very itchy  hands/fingers Xerosis fingertips, thumbs with cracking    Assessment & Plan   Actinic Damage - chronic, secondary to cumulative UV radiation exposure/sun exposure over time - diffuse scaly erythematous macules with underlying dyspigmentation - Recommend daily broad spectrum sunscreen SPF 30+ to sun-exposed areas, reapply every 2 hours as needed.  - Recommend staying in the shade or wearing long sleeves, sun glasses (UVA+UVB protection) and wide brim hats (4-inch brim around the entire circumference of the hat). - Call for new or changing lesions.   Lentigines - Scattered tan macules -  Due to sun exposure - Benign-appering, observe - Recommend daily broad spectrum sunscreen SPF 30+ to sun-exposed areas, reapply every 2 hours as needed. - Call for any changes  - hands  Rosacea face  Chronic and persistent condition with duration or expected duration over one year. Condition is symptomatic/ bothersome to patient (persistent redness). Not currently at goal.  Rosacea is a chronic progressive skin condition usually affecting the face of adults, causing redness and/or acne bumps. It is treatable but not curable. It sometimes affects the eyes (ocular rosacea) as well. It may respond to topical and/or systemic medication and can flare with stress, sun exposure, alcohol, exercise and some foods.  Daily application of broad spectrum spf 30+ sunscreen to face is recommended to reduce flares.  Discussed BBL laser procedure for telangiectasias, noncovered Discussed Rhofade cream qam for redness Discussed asymmetry of malar cheeks probably not related to rosacea since no improvement with oral doxycycline  Restart Metronidazole 0.75% cr qhs Continue daily sunscreen to face D/C Doxycycline  Related Medications metroNIDAZOLE (METROCREAM) 0.75 % cream Apply to mid face 1-2 times a day for rosacea.  AK (actinic keratosis) (2) R lat eyebrow x 1, L temple x 1  Destruction of lesion - R lat eyebrow x 1, L temple x 1  Destruction method: cryotherapy   Informed consent: discussed and consent obtained   Lesion destroyed using liquid nitrogen: Yes   Region frozen until ice ball extended beyond lesion: Yes   Outcome: patient tolerated procedure well with no complications  Post-procedure details: wound care instructions given   Additional details:  Prior to procedure, discussed risks of blister formation, small wound, skin dyspigmentation, or rare scar following cryotherapy. Recommend Vaseline ointment to treated areas while healing.   Seborrheic keratosis L jaw  Benign, observe.     Inflamed seborrheic keratosis L mid back x 1  Vs Notalgia Paraesthetica  If not improved with cryotherapy, may use the TMC 0.1% cr qd/bid prn itch, avoid f/g/a  Destruction of lesion - L mid back x 1  Destruction method: cryotherapy   Informed consent: discussed and consent obtained   Lesion destroyed using liquid nitrogen: Yes   Region frozen until ice ball extended beyond lesion: Yes   Outcome: patient tolerated procedure well with no complications   Post-procedure details: wound care instructions given   Additional details:  Prior to procedure, discussed risks of blister formation, small wound, skin dyspigmentation, or rare scar following cryotherapy. Recommend Vaseline ointment to treated areas while healing.   Hand dermatitis hands/fingers  Hand Dermatitis is a chronic type of eczema that can come and go on the hands and fingers.  While there is no cure, the rash and symptoms can be managed with topical prescription medications, and for more severe cases, with systemic medications.  Recommend mild soap and routine use of moisturizing cream after handwashing.  Minimize soap/water exposure when possible.     Start TMC 0.1% cr qd/bid to fingertips until clear, avoid f/g/a Recommend mild soap and moisturizer qd, samples of Cetaphil moisturizers given to pt  Topical steroids (such as triamcinolone, fluocinolone, fluocinonide, mometasone, clobetasol, halobetasol, betamethasone, hydrocortisone) can cause thinning and lightening of the skin if they are used for too long in the same area. Your physician has selected the right strength medicine for your problem and area affected on the body. Please use your medication only as directed by your physician to prevent side effects.    triamcinolone cream (KENALOG) 0.1 % - hands/fingers Apply 1 application topically as directed. Qd to bid to fingertips until clear, then prn flares, avoid face, groin, axilla   Return in about 1 year (around  11/24/2022) for TBSE, Hx of AKs.  I, Othelia Pulling, RMA, am acting as scribe for Brendolyn Patty, MD .   Documentation: I have reviewed the above documentation for accuracy and completeness, and I agree with the above.  Brendolyn Patty MD

## 2021-12-01 ENCOUNTER — Other Ambulatory Visit: Payer: Self-pay | Admitting: Family Medicine

## 2021-12-01 DIAGNOSIS — E7801 Familial hypercholesterolemia: Secondary | ICD-10-CM

## 2021-12-15 ENCOUNTER — Ambulatory Visit: Payer: Self-pay

## 2021-12-15 NOTE — Telephone Encounter (Signed)
? ?  Chief Complaint: Dog bite to left leg ?Symptoms: Small laceration ?Frequency: Last Monday ?Pertinent Negatives: Patient denies any redness ?Disposition: '[]'$ ED /'[]'$ Urgent Care (no appt availability in office) / '[x]'$ Appointment(In office/virtual)/ '[]'$  [facility]/ '[]'$ Home Care/ '[]'$ Refused Recommended Disposition /'[]'$ [facility]/ '[]'$  Follow-up with PCP ?Additional Notes:  Dog bite last Monday.Dog "up on his shots. The bite area is still open and oozing."  ? ?Reason for Disposition ? [1] Taking antibiotic > 72 hours (3 days) for infected bite AND [2] has not improved (i.e., pain, pus, redness) ? ?Answer Assessment - Initial Assessment Questions ?1. ANIMAL: "What type of animal caused the bite?" "Is the injury from a bite or a claw?" If the animal is a dog or a cat, ask: "Was it a pet or a stray?" "Was it acting ill or behaving strangely?" ?    Dog ?2. LOCATION: "Where is the bite located?"  ?    Below knee on left ?3. SIZE: "How big is the bite?" "What does it look like?"  ?    Small ?4. ONSET: "When did the bite happen?" (Minutes or hours ago)  ?    Last Monday ?5. CIRCUMSTANCES: "Tell me how this happened."  ?    Bite ?6. TETANUS: "When was the last tetanus booster?" ?    Unsure ?7. PREGNANCY: "Is there any chance you are pregnant?" "When was your last menstrual period?" ?    No ? ?Protocols used: Animal Bite-A-AH ? ?

## 2021-12-16 ENCOUNTER — Other Ambulatory Visit: Payer: Self-pay

## 2021-12-16 ENCOUNTER — Ambulatory Visit (INDEPENDENT_AMBULATORY_CARE_PROVIDER_SITE_OTHER): Payer: Medicare Other | Admitting: Family Medicine

## 2021-12-16 ENCOUNTER — Encounter: Payer: Self-pay | Admitting: Family Medicine

## 2021-12-16 VITALS — BP 130/80 | HR 60 | Ht 58.5 in | Wt 138.0 lb

## 2021-12-16 DIAGNOSIS — Z23 Encounter for immunization: Secondary | ICD-10-CM

## 2021-12-16 DIAGNOSIS — S8012XA Contusion of left lower leg, initial encounter: Secondary | ICD-10-CM | POA: Diagnosis not present

## 2021-12-16 DIAGNOSIS — L03116 Cellulitis of left lower limb: Secondary | ICD-10-CM

## 2021-12-16 MED ORDER — AMOXICILLIN-POT CLAVULANATE 875-125 MG PO TABS: 1.0000 | ORAL_TABLET | Freq: Two times a day (BID) | ORAL | 0 refills | Status: DC

## 2021-12-16 MED ORDER — MUPIROCIN 2 % EX OINT
1.0000 | TOPICAL_OINTMENT | Freq: Two times a day (BID) | CUTANEOUS | 0 refills | Status: DC
Start: 2021-12-16 — End: 2022-08-25

## 2021-12-16 NOTE — Progress Notes (Signed)
? ? ?Date:  12/16/2021  ? ?Name:  Jennifer Kirby   DOB:  04/29/1956   MRN:  [id number] ? ? ?Chief Complaint: Animal Bite ? ?Animal Bite  ?Episode onset: about a week ago. The incident occurred at another residence. There is an injury to the Left lower leg. The pain is mild. It is unlikely that a foreign body is present. Pertinent negatives include no chest pain, no abdominal pain, no nausea, no headaches, no neck pain and no cough. Her tetanus status is UTD. There were no sick contacts.  ? ?Lab Results  ?Component Value Date  ? NA 143 07/25/2021  ? K 4.8 07/25/2021  ? CO2 25 07/25/2021  ? GLUCOSE 106 (H) 07/25/2021  ? BUN 15 07/25/2021  ? CREATININE 0.67 07/25/2021  ? CALCIUM 9.4 07/25/2021  ? EGFR 96 07/25/2021  ? GFRNONAA 93 06/20/2020  ? ?Lab Results  ?Component Value Date  ? CHOL 238 (H) 07/25/2021  ? HDL 70 07/25/2021  ? LDLCALC 157 (H) 07/25/2021  ? TRIG 68 07/25/2021  ? CHOLHDL 3.5 07/28/2017  ? ?No results found for: TSH ?No results found for: HGBA1C ?Lab Results  ?Component Value Date  ? WBC 6.9 07/28/2017  ? HGB 14.4 07/28/2017  ? HCT 41.9 07/28/2017  ? MCV 86 07/28/2017  ? PLT 263 07/28/2017  ? ?No results found for: ALT, AST, GGT, ALKPHOS, BILITOT ?No results found for: 25OHVITD2, [place], VD25OH  ? ?Review of Systems  ?Constitutional:  Negative for chills and fever.  ?HENT:  Negative for drooling, ear discharge, ear pain and sore throat.   ?Respiratory:  Negative for cough, shortness of breath and wheezing.   ?Cardiovascular:  Negative for chest pain, palpitations and leg swelling.  ?Gastrointestinal:  Negative for abdominal pain, blood in stool, constipation, diarrhea and nausea.  ?Endocrine: Negative for polydipsia.  ?Genitourinary:  Negative for dysuria, frequency, hematuria and urgency.  ?Musculoskeletal:  Negative for back pain, myalgias and neck pain.  ?Skin:  Negative for rash.  ?Allergic/Immunologic: Negative for environmental allergies.  ?Neurological:  Negative for dizziness and  headaches.  ?Hematological:  Does not bruise/bleed easily.  ?Psychiatric/Behavioral:  Negative for suicidal ideas. The patient is not nervous/anxious.   ? ?Patient Active Problem List  ? Diagnosis Date Noted  ? Seasonal allergic rhinitis due to pollen 07/25/2021  ? Gastroesophageal reflux disease 07/25/2021  ? Traumatic partial tear of left biceps tendon 05/26/2019  ? Traumatic complete tear of left rotator cuff 05/26/2019  ? Synovitis of left shoulder 05/26/2019  ? Subacromial impingement, left 05/26/2019  ? Obesity (BMI 30-39.9) 11/25/2018  ? Contact dermatitis 02/15/2018  ? Mass of skin of right shoulder 07/27/2016  ? Brachial neuritis 03/08/2015  ? Encounter for general adult medical examination without abnormal findings 03/08/2015  ? Herpes zoster 03/08/2015  ? H/O hypercholesterolemia 03/08/2015  ? HLD (hyperlipidemia) 03/08/2015  ? ? ?Allergies  ?Allergen Reactions  ? Statins   ? Zetia [Ezetimibe] Other (See Comments)  ?  Gave leg cramps  ? ? ?Past Surgical History:  ?Procedure Laterality Date  ? FOOT SURGERY Left   ? plantar fasciatis and broken foot  ? ROTATOR CUFF REPAIR Left 05/26/2019  ? bicep tear, tendon repair and skin graft - [facility]   ? ? ?Social History  ? ?Tobacco Use  ? Smoking status: Never  ? Smokeless tobacco: Never  ?Vaping Use  ? Vaping Use: Never used  ?Substance Use Topics  ? Alcohol use: Not Currently  ? Drug use: No  ? ? ? ?  Medication list has been reviewed and updated. ? ?Current Meds  ?Medication Sig  ? calcium carbonate (TUMS - DOSED IN MG ELEMENTAL CALCIUM) 500 MG chewable tablet Chew 1 tablet by mouth daily.  ? meloxicam (MOBIC) 15 MG tablet Take by mouth.  ? metroNIDAZOLE (METROCREAM) 0.75 % cream Apply to mid face 1-2 times a day for rosacea.  ? montelukast (SINGULAIR) 10 MG tablet Take 1 tablet (10 mg total) by mouth at bedtime.  ? pantoprazole (PROTONIX) 40 MG tablet Take 1 tablet (40 mg total) by mouth daily.  ? rosuvastatin (CRESTOR) 5 MG tablet TAKE 1 TABLET (5 MG TOTAL) BY  MOUTH 2 (TWO) TIMES A WEEK ON TUESDAY AND THURSDAY  ? Sennosides-Docusate Sodium (SENNA PLUS) 8.6-50 MG CAPS Take 1 capsule by mouth daily.  ? triamcinolone cream (KENALOG) 0.1 % Apply 1 application topically as directed. Qd to bid to fingertips until clear, then prn flares, avoid face, groin, axilla  ? ? ?PHQ 2/9 Scores 07/25/2021 06/30/2021 06/20/2020 05/29/2020  ?PHQ - 2 Score 0 0 0 0  ?PHQ- 9 Score 0 - 0 -  ? ? ?GAD 7 : Generalized Anxiety Score 07/25/2021 06/20/2020  ?Nervous, Anxious, on Edge 0 0  ?Control/stop worrying 0 0  ?Worry too much - different things 0 0  ?Trouble relaxing 0 0  ?Restless 0 0  ?Easily annoyed or irritable 0 0  ?Afraid - awful might happen 0 0  ?Total GAD 7 Score 0 0  ? ? ?BP Readings from Last 3 Encounters:  ?12/16/21 130/80  ?07/25/21 120/70  ?06/30/21 108/72  ? ? ?Physical Exam ?Vitals and nursing note reviewed.  ?Constitutional:   ?   Appearance: She is well-developed.  ?HENT:  ?   Head: Normocephalic.  ?   Right Ear: Tympanic membrane and external ear normal. There is no impacted cerumen.  ?   Left Ear: Tympanic membrane and external ear normal. There is no impacted cerumen.  ?   Mouth/Throat:  ?   Mouth: Mucous membranes are moist.  ?Eyes:  ?   General: Lids are everted, no foreign bodies appreciated. No scleral icterus.    ?   Left eye: No foreign body or hordeolum.  ?   Conjunctiva/sclera: Conjunctivae normal.  ?   Right eye: Right conjunctiva is not injected.  ?   Left eye: Left conjunctiva is not injected.  ?   Pupils: Pupils are equal, round, and reactive to light.  ?Neck:  ?   Thyroid: No thyromegaly.  ?   Vascular: No JVD.  ?   Trachea: No tracheal deviation.  ?Cardiovascular:  ?   Rate and Rhythm: Normal rate and regular rhythm.  ?   Heart sounds: Normal heart sounds. No murmur heard. ?  No friction rub. No gallop.  ?Pulmonary:  ?   Effort: Pulmonary effort is normal. No respiratory distress.  ?   Breath sounds: Normal breath sounds. No wheezing, rhonchi or rales.  ?Abdominal:   ?   General: Bowel sounds are normal.  ?   Palpations: Abdomen is soft. There is no mass.  ?   Tenderness: There is no abdominal tenderness. There is no guarding or rebound.  ?Musculoskeletal:     ?   General: Normal range of motion.  ?   Cervical back: Normal range of motion and neck supple.  ?   Left lower leg: Swelling and tenderness present. No bony tenderness.  ?   Comments: Surrounding soft tissue tenderness only  ?Lymphadenopathy:  ?  Cervical: No cervical adenopathy.  ?Skin: ?   General: Skin is warm.  ?   Findings: Ecchymosis, erythema and wound present.  ? ?    ?   Comments: Avulsion with puncture  ?Neurological:  ?   Mental Status: She is alert.  ?   Cranial Nerves: No cranial nerve deficit.  ?   Deep Tendon Reflexes: Reflexes normal.  ?Psychiatric:     ?   Mood and Affect: Mood is not anxious or depressed.  ? ? ?Wt Readings from Last 3 Encounters:  ?12/16/21 138 lb (62.6 kg)  ?07/25/21 131 lb (59.4 kg)  ?06/30/21 131 lb 9.6 oz (59.7 kg)  ? ? ?BP 130/80   Pulse 60   Ht 4' 10.5" (1.486 m)   Wt 138 lb (62.6 kg)   BMI 28.35 kg/m?  ? ?Assessment and Plan: ? ?1. Cellulitis of left lower extremity ?New onset.  Relatively stable.  Progression of photos notes improvement but there is some drainage noted on the dressing.  Probably from the puncture site wound that is still present in the avulsion wound.  We will treat with regimen of cleaning with antibacterial wash, application of Bactroban ointment, nonstick dressing, roll gauze with netting.  We will initiate Augmentin 875 mg twice a day. ? ?2. Contusion of left lower leg, initial encounter ?Area of contusion noted surrounding which is mild tenderness but no tenderness over the tibia or fibula.  May take meloxicam as needed. ? ?3. Need for pneumococcal vaccination ?Discussed and administered ?- Pneumococcal conjugate vaccine 20-valent (Prevnar 20)  ? ? ?

## 2022-01-16 ENCOUNTER — Other Ambulatory Visit: Payer: Self-pay | Admitting: Family Medicine

## 2022-01-16 DIAGNOSIS — E7801 Familial hypercholesterolemia: Secondary | ICD-10-CM

## 2022-01-23 ENCOUNTER — Ambulatory Visit (INDEPENDENT_AMBULATORY_CARE_PROVIDER_SITE_OTHER): Payer: Medicare Other | Admitting: Family Medicine

## 2022-01-23 ENCOUNTER — Encounter: Payer: Self-pay | Admitting: Family Medicine

## 2022-01-23 VITALS — BP 122/80 | HR 76 | Ht 58.5 in | Wt 140.0 lb

## 2022-01-23 DIAGNOSIS — J301 Allergic rhinitis due to pollen: Secondary | ICD-10-CM | POA: Diagnosis not present

## 2022-01-23 DIAGNOSIS — R739 Hyperglycemia, unspecified: Secondary | ICD-10-CM

## 2022-01-23 DIAGNOSIS — E7801 Familial hypercholesterolemia: Secondary | ICD-10-CM | POA: Diagnosis not present

## 2022-01-23 DIAGNOSIS — K219 Gastro-esophageal reflux disease without esophagitis: Secondary | ICD-10-CM | POA: Diagnosis not present

## 2022-01-23 MED ORDER — MONTELUKAST SODIUM 10 MG PO TABS: 10.0000 mg | ORAL_TABLET | Freq: Every day | ORAL | 1 refills | Status: DC

## 2022-01-23 MED ORDER — ROSUVASTATIN CALCIUM 5 MG PO TABS: ORAL_TABLET | ORAL | 1 refills | Status: DC

## 2022-01-23 MED ORDER — PANTOPRAZOLE SODIUM 40 MG PO TBEC: 40.0000 mg | DELAYED_RELEASE_TABLET | Freq: Every day | ORAL | 1 refills | Status: DC

## 2022-01-23 NOTE — Progress Notes (Signed)
? ? ?Date:  01/23/2022  ? ?Name:  Jennifer Kirby   DOB:  Apr 18, 1956   MRN:  [id number] ? ? ?Chief Complaint: Hyperlipidemia, Gastroesophageal Reflux, and Allergic Rhinitis  ? ?Hyperlipidemia ?This is a chronic problem. The current episode started more than 1 year ago. The problem is controlled. Recent lipid tests were reviewed and are normal. She has no history of chronic renal disease, diabetes, hypothyroidism, liver disease, obesity or nephrotic syndrome. Factors aggravating her hyperlipidemia include thiazides. Pertinent negatives include no chest pain, focal sensory loss, focal weakness, leg pain, myalgias or shortness of breath. Current antihyperlipidemic treatment includes statins. The current treatment provides moderate improvement of lipids. Risk factors for coronary artery disease include dyslipidemia.  ?Gastroesophageal Reflux ?She reports no abdominal pain, no belching, no chest pain, no choking, no coughing, no dysphagia, no early satiety, no globus sensation, no heartburn, no hoarse voice, no nausea, no sore throat, no stridor or no wheezing. This is a chronic problem. The current episode started more than 1 year ago. The problem has been gradually improving. She has tried a PPI for the symptoms. The treatment provided moderate relief.  ? ?Lab Results  ?Component Value Date  ? NA 143 07/25/2021  ? K 4.8 07/25/2021  ? CO2 25 07/25/2021  ? GLUCOSE 106 (H) 07/25/2021  ? BUN 15 07/25/2021  ? CREATININE 0.67 07/25/2021  ? CALCIUM 9.4 07/25/2021  ? EGFR 96 07/25/2021  ? GFRNONAA 93 06/20/2020  ? ?Lab Results  ?Component Value Date  ? CHOL 238 (H) 07/25/2021  ? HDL 70 07/25/2021  ? LDLCALC 157 (H) 07/25/2021  ? TRIG 68 07/25/2021  ? CHOLHDL 3.5 07/28/2017  ? ?No results found for: TSH ?No results found for: HGBA1C ?Lab Results  ?Component Value Date  ? WBC 6.9 07/28/2017  ? HGB 14.4 07/28/2017  ? HCT 41.9 07/28/2017  ? MCV 86 07/28/2017  ? PLT 263 07/28/2017  ? ?No results found for: ALT, AST, GGT,  ALKPHOS, BILITOT ?No results found for: 25OHVITD2, [place], VD25OH  ? ?Review of Systems  ?Constitutional:  Negative for chills and fever.  ?HENT:  Negative for drooling, ear discharge, ear pain, hoarse voice and sore throat.   ?Respiratory:  Negative for cough, choking, shortness of breath and wheezing.   ?Cardiovascular:  Negative for chest pain, palpitations and leg swelling.  ?Gastrointestinal:  Negative for abdominal pain, blood in stool, constipation, diarrhea, dysphagia, heartburn and nausea.  ?Endocrine: Negative for polydipsia.  ?Genitourinary:  Negative for dysuria, frequency, hematuria and urgency.  ?Musculoskeletal:  Negative for back pain, myalgias and neck pain.  ?Skin:  Negative for rash.  ?Allergic/Immunologic: Negative for environmental allergies.  ?Neurological:  Negative for dizziness, focal weakness and headaches.  ?Hematological:  Does not bruise/bleed easily.  ?Psychiatric/Behavioral:  Negative for suicidal ideas. The patient is not nervous/anxious.   ? ?Patient Active Problem List  ? Diagnosis Date Noted  ? Seasonal allergic rhinitis due to pollen 07/25/2021  ? Gastroesophageal reflux disease 07/25/2021  ? Traumatic partial tear of left biceps tendon 05/26/2019  ? Traumatic complete tear of left rotator cuff 05/26/2019  ? Synovitis of left shoulder 05/26/2019  ? Subacromial impingement, left 05/26/2019  ? Obesity (BMI 30-39.9) 11/25/2018  ? Contact dermatitis 02/15/2018  ? Mass of skin of right shoulder 07/27/2016  ? Brachial neuritis 03/08/2015  ? Encounter for general adult medical examination without abnormal findings 03/08/2015  ? Herpes zoster 03/08/2015  ? H/O hypercholesterolemia 03/08/2015  ? HLD (hyperlipidemia) 03/08/2015  ? ? ?Allergies  ?Allergen  Reactions  ? Statins   ? Zetia [Ezetimibe] Other (See Comments)  ?  Gave leg cramps  ? ? ?Past Surgical History:  ?Procedure Laterality Date  ? FOOT SURGERY Left   ? plantar fasciatis and broken foot  ? ROTATOR CUFF REPAIR Left  05/26/2019  ? bicep tear, tendon repair and skin graft - [facility]   ? ? ?Social History  ? ?Tobacco Use  ? Smoking status: Never  ? Smokeless tobacco: Never  ?Vaping Use  ? Vaping Use: Never used  ?Substance Use Topics  ? Alcohol use: Not Currently  ? Drug use: No  ? ? ? ?Medication list has been reviewed and updated. ? ?Current Meds  ?Medication Sig  ? aluminum chloride (DRYSOL) 20 % external solution Apply topically at bedtime 3 times a week.  ? calcium carbonate (TUMS - DOSED IN MG ELEMENTAL CALCIUM) 500 MG chewable tablet Chew 1 tablet by mouth daily.  ? meloxicam (MOBIC) 15 MG tablet Take by mouth.  ? metroNIDAZOLE (METROCREAM) 0.75 % cream Apply to mid face 1-2 times a day for rosacea.  ? montelukast (SINGULAIR) 10 MG tablet Take 1 tablet (10 mg total) by mouth at bedtime.  ? mupirocin ointment (BACTROBAN) 2 % Apply 1 application. topically 2 (two) times daily.  ? pantoprazole (PROTONIX) 40 MG tablet Take 1 tablet (40 mg total) by mouth daily.  ? rosuvastatin (CRESTOR) 5 MG tablet TAKE 1 TABLET (5 MG TOTAL) BY MOUTH 2 (TWO) TIMES A WEEK ON TUESDAY AND THURSDAY  ? Sennosides-Docusate Sodium (SENNA PLUS) 8.6-50 MG CAPS Take 1 capsule by mouth daily.  ? triamcinolone cream (KENALOG) 0.1 % Apply 1 application topically as directed. Qd to bid to fingertips until clear, then prn flares, avoid face, groin, axilla  ? ? ? ?  01/23/2022  ?  8:30 AM 07/25/2021  ?  8:02 AM 06/20/2020  ?  8:15 AM  ?GAD 7 : Generalized Anxiety Score  ?Nervous, Anxious, on Edge 0 0 0  ?Control/stop worrying 0 0 0  ?Worry too much - different things 0 0 0  ?Trouble relaxing 0 0 0  ?Restless 0 0 0  ?Easily annoyed or irritable 0 0 0  ?Afraid - awful might happen 0 0 0  ?Total GAD 7 Score 0 0 0  ?Anxiety Difficulty Not difficult at all    ? ? ? ?  01/23/2022  ?  8:28 AM  ?Depression screen PHQ 2/9  ?Decreased Interest 0  ?Down, Depressed, Hopeless 0  ?PHQ - 2 Score 0  ?Altered sleeping 0  ?Tired, decreased energy 0  ?Change in appetite 0  ?Feeling bad  or failure about yourself  0  ?Trouble concentrating 0  ?Moving slowly or fidgety/restless 0  ?Suicidal thoughts 0  ?PHQ-9 Score 0  ?Difficult doing work/chores Not difficult at all  ? ? ?BP Readings from Last 3 Encounters:  ?01/23/22 122/80  ?12/16/21 130/80  ?07/25/21 120/70  ? ? ?Physical Exam ?Vitals and nursing note reviewed.  ?Constitutional:   ?   Appearance: She is well-developed.  ?HENT:  ?   Head: Normocephalic.  ?   Right Ear: Tympanic membrane and external ear normal.  ?   Left Ear: Tympanic membrane and external ear normal.  ?   Nose: No congestion or rhinorrhea.  ?Eyes:  ?   General: Lids are everted, no foreign bodies appreciated. No scleral icterus.    ?   Left eye: No foreign body or hordeolum.  ?   Conjunctiva/sclera: Conjunctivae normal.  ?  Right eye: Right conjunctiva is not injected.  ?   Left eye: Left conjunctiva is not injected.  ?   Pupils: Pupils are equal, round, and reactive to light.  ?Neck:  ?   Thyroid: No thyromegaly.  ?   Vascular: No JVD.  ?   Trachea: No tracheal deviation.  ?Cardiovascular:  ?   Rate and Rhythm: Normal rate and regular rhythm.  ?   Heart sounds: Normal heart sounds. No murmur heard. ?  No friction rub. No gallop.  ?Pulmonary:  ?   Effort: Pulmonary effort is normal. No respiratory distress.  ?   Breath sounds: Normal breath sounds. No wheezing, rhonchi or rales.  ?Chest:  ?   Chest wall: No tenderness.  ?Abdominal:  ?   General: Bowel sounds are normal.  ?   Palpations: Abdomen is soft. There is no mass.  ?   Tenderness: There is no abdominal tenderness. There is no guarding or rebound.  ?Musculoskeletal:     ?   General: No tenderness. Normal range of motion.  ?   Cervical back: Normal range of motion and neck supple.  ?Lymphadenopathy:  ?   Cervical: No cervical adenopathy.  ?Skin: ?   General: Skin is warm.  ?   Coloration: Skin is not jaundiced or pale.  ?   Findings: No rash.  ?Neurological:  ?   Mental Status: She is alert and oriented to person, place,  and time.  ?   Cranial Nerves: No cranial nerve deficit.  ?   Deep Tendon Reflexes: Reflexes normal.  ?Psychiatric:     ?   Mood and Affect: Mood is not anxious or depressed.  ? ? ?Wt Readings from Last 3 Encounters

## 2022-01-24 LAB — COMPREHENSIVE METABOLIC PANEL
ALT: 13 IU/L (ref 0–32)
AST: 23 IU/L (ref 0–40)
Albumin/Globulin Ratio: 1.7 (ref 1.2–2.2)
Albumin: 4.4 g/dL (ref 3.8–4.8)
Alkaline Phosphatase: 74 IU/L (ref 44–121)
BUN/Creatinine Ratio: 23 (ref 12–28)
BUN: 13 mg/dL (ref 8–27)
Bilirubin Total: 0.3 mg/dL (ref 0.0–1.2)
CO2: 21 mmol/L (ref 20–29)
Calcium: 9.2 mg/dL (ref 8.7–10.3)
Chloride: 105 mmol/L (ref 96–106)
Creatinine, Ser: 0.56 mg/dL — ABNORMAL LOW (ref 0.57–1.00)
Globulin, Total: 2.6 g/dL (ref 1.5–4.5)
Glucose: 102 mg/dL — ABNORMAL HIGH (ref 70–99)
Potassium: 4.3 mmol/L (ref 3.5–5.2)
Sodium: 142 mmol/L (ref 134–144)
Total Protein: 7 g/dL (ref 6.0–8.5)
eGFR: 100 mL/min/{1.73_m2} (ref >59)

## 2022-01-24 LAB — LIPID PANEL WITH LDL/HDL RATIO
Cholesterol, Total: 205 mg/dL — ABNORMAL HIGH (ref 100–199)
HDL: 81 mg/dL (ref >39)
LDL Chol Calc (NIH): 113 mg/dL — ABNORMAL HIGH (ref 0–99)
LDL/HDL Ratio: 1.4 ratio (ref 0.0–3.2)
Triglycerides: 63 mg/dL (ref 0–149)
VLDL Cholesterol Cal: 11 mg/dL (ref 5–40)

## 2022-01-29 ENCOUNTER — Ambulatory Visit: Payer: Medicare Other | Admitting: Family Medicine

## 2022-03-13 DIAGNOSIS — M7551 Bursitis of right shoulder: Secondary | ICD-10-CM | POA: Diagnosis not present

## 2022-05-04 DIAGNOSIS — M25511 Pain in right shoulder: Secondary | ICD-10-CM | POA: Diagnosis not present

## 2022-05-06 DIAGNOSIS — M25511 Pain in right shoulder: Secondary | ICD-10-CM | POA: Diagnosis not present

## 2022-05-11 DIAGNOSIS — M25511 Pain in right shoulder: Secondary | ICD-10-CM | POA: Diagnosis not present

## 2022-05-18 DIAGNOSIS — M25511 Pain in right shoulder: Secondary | ICD-10-CM | POA: Diagnosis not present

## 2022-05-22 ENCOUNTER — Encounter: Payer: Self-pay | Admitting: Family Medicine

## 2022-05-22 DIAGNOSIS — M25511 Pain in right shoulder: Secondary | ICD-10-CM | POA: Diagnosis not present

## 2022-05-25 DIAGNOSIS — M25511 Pain in right shoulder: Secondary | ICD-10-CM | POA: Diagnosis not present

## 2022-05-29 DIAGNOSIS — M25511 Pain in right shoulder: Secondary | ICD-10-CM | POA: Diagnosis not present

## 2022-06-03 DIAGNOSIS — M25511 Pain in right shoulder: Secondary | ICD-10-CM | POA: Diagnosis not present

## 2022-06-05 DIAGNOSIS — M25511 Pain in right shoulder: Secondary | ICD-10-CM | POA: Diagnosis not present

## 2022-06-08 DIAGNOSIS — M25511 Pain in right shoulder: Secondary | ICD-10-CM | POA: Diagnosis not present

## 2022-06-12 DIAGNOSIS — M25511 Pain in right shoulder: Secondary | ICD-10-CM | POA: Diagnosis not present

## 2022-06-16 DIAGNOSIS — M25511 Pain in right shoulder: Secondary | ICD-10-CM | POA: Diagnosis not present

## 2022-06-19 DIAGNOSIS — M25511 Pain in right shoulder: Secondary | ICD-10-CM | POA: Diagnosis not present

## 2022-06-22 DIAGNOSIS — M25511 Pain in right shoulder: Secondary | ICD-10-CM | POA: Diagnosis not present

## 2022-07-01 ENCOUNTER — Ambulatory Visit: Payer: Medicare Other

## 2022-07-03 DIAGNOSIS — M25511 Pain in right shoulder: Secondary | ICD-10-CM | POA: Diagnosis not present

## 2022-07-06 ENCOUNTER — Other Ambulatory Visit: Payer: Self-pay | Admitting: Orthopedic Surgery

## 2022-07-06 DIAGNOSIS — G8929 Other chronic pain: Secondary | ICD-10-CM

## 2022-07-06 DIAGNOSIS — M25311 Other instability, right shoulder: Secondary | ICD-10-CM

## 2022-07-06 DIAGNOSIS — M7551 Bursitis of right shoulder: Secondary | ICD-10-CM

## 2022-07-07 DIAGNOSIS — M25511 Pain in right shoulder: Secondary | ICD-10-CM | POA: Diagnosis not present

## 2022-07-10 DIAGNOSIS — M25511 Pain in right shoulder: Secondary | ICD-10-CM | POA: Diagnosis not present

## 2022-07-13 DIAGNOSIS — M25511 Pain in right shoulder: Secondary | ICD-10-CM | POA: Diagnosis not present

## 2022-07-15 ENCOUNTER — Ambulatory Visit (INDEPENDENT_AMBULATORY_CARE_PROVIDER_SITE_OTHER): Payer: Medicare Other

## 2022-07-15 VITALS — BP 104/68 | HR 96 | Temp 98.8°F | Resp 17 | Ht 59.06 in | Wt 144.4 lb

## 2022-07-15 DIAGNOSIS — Z0000 Encounter for general adult medical examination without abnormal findings: Secondary | ICD-10-CM

## 2022-07-15 DIAGNOSIS — Z23 Encounter for immunization: Secondary | ICD-10-CM

## 2022-07-15 DIAGNOSIS — Z1231 Encounter for screening mammogram for malignant neoplasm of breast: Secondary | ICD-10-CM | POA: Diagnosis not present

## 2022-07-15 DIAGNOSIS — M25511 Pain in right shoulder: Secondary | ICD-10-CM | POA: Diagnosis not present

## 2022-07-15 NOTE — Patient Instructions (Signed)

## 2022-07-15 NOTE — Progress Notes (Signed)
Subjective:   Jennifer Kirby is a 66 y.o. female who presents for Medicare Annual (Subsequent) preventive examination.  Review of Systems    Per HPI unless specifically indicated below.  Cardiac Risk Factors include: advanced age (>63mn, >>65women);female gender          Objective:    Today's Vitals   07/15/22 1310 07/15/22 1323  BP: 104/68   Pulse: 96   Resp: 17   Temp: 98.8 F (37.1 C)   TempSrc: Oral   SpO2: 98%   Weight: 144 lb 6.4 oz (65.5 kg)   Height: 4' 11.06" (1.5 m)   PainSc: 0-No pain 7    Body mass index is 29.11 kg/m.     06/30/2021    9:35 AM 05/29/2020    8:25 AM 05/12/2018    5:01 PM  Advanced Directives  Does Patient Have a Medical Advance Directive? Yes Yes Yes  Type of [place]of [place] will Living will  Copy of [place] Chart? No - copy requested No - copy requested   Would patient like information on creating a medical advance directive?   Yes (ED - Information included in AVS)    Current Medications (verified) Outpatient Encounter Medications as of 07/15/2022  Medication Sig   acetaminophen (TYLENOL) 500 MG tablet Take 500 mg by mouth every 6 (six) hours as needed.   calcium carbonate (TUMS - DOSED IN MG ELEMENTAL CALCIUM) 500 MG chewable tablet Chew 1 tablet by mouth daily.   meloxicam (MOBIC) 15 MG tablet Take by mouth.   montelukast (SINGULAIR) 10 MG tablet Take 1 tablet (10 mg total) by mouth at bedtime.   mupirocin ointment (BACTROBAN) 2 % Apply 1 application. topically 2 (two) times daily.   pantoprazole (PROTONIX) 40 MG tablet Take 1 tablet (40 mg total) by mouth daily.   Sennosides-Docusate Sodium (SENNA PLUS) 8.6-50 MG CAPS Take 1 capsule by mouth daily.   triamcinolone cream (KENALOG) 0.1 % Apply 1 application topically as directed. Qd to bid to fingertips until clear, then prn flares, avoid face, groin, axilla   aluminum  chloride (DRYSOL) 20 % external solution Apply topically at bedtime 3 times a week. (Patient not taking: Reported on 07/15/2022)   metroNIDAZOLE (METROCREAM) 0.75 % cream Apply to mid face 1-2 times a day for rosacea. (Patient not taking: Reported on 07/15/2022)   rosuvastatin (CRESTOR) 5 MG tablet TAKE 1 TABLET (5 MG TOTAL) BY MOUTH 2 (TWO) TIMES A WEEK ON TUESDAY AND THURSDAY (Patient not taking: Reported on 07/15/2022)   No facility-administered encounter medications on file as of 07/15/2022.    Allergies (verified) Statins and Zetia [ezetimibe]   History: Past Medical History:  Diagnosis Date   Actinic keratosis    Allergy    Hyperlipidemia    Past Surgical History:  Procedure Laterality Date   FOOT SURGERY Left    plantar fasciatis and broken foot   ROTATOR CUFF REPAIR Left 05/26/2019   bicep tear, tendon repair and skin graft - [facility]    Family History  Problem Relation Age of Onset   Throat cancer Father    Breast cancer Neg Hx    Social History   Socioeconomic History   Marital status: Widowed    Spouse name: Not on file   Number of children: 2   Years of education: Not on file   Highest education level: Not on file  Occupational History   Occupation: retired  Tobacco Use   Smoking status: Never   Smokeless tobacco: Never  Vaping Use   Vaping Use: Never used  Substance and Sexual Activity   Alcohol use: Not Currently   Drug use: No   Sexual activity: Never  Other Topics Concern   Not on file  Social History Narrative   Pt lives alone. 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    Social Determinants of Health   Financial Resource Strain: Low Risk  (06/30/2021)   Overall Financial Resource Strain (CARDIA)    Difficulty of Paying Living Expenses: Not hard at all  Food Insecurity: No Food Insecurity (07/15/2022)   Hunger Vital Sign    Worried About Running Out of Food in the Last Year: Never true    Ran Out of Food in the Last Year: Never true  Transportation Needs:  No Transportation Needs (07/15/2022)   PRAPARE - Hydrologist (Medical): No    Lack of Transportation (Non-Medical): No  Physical Activity: Sufficiently Active (07/15/2022)   Exercise Vital Sign    Days of Exercise per Week: 4 days    Minutes of Exercise per Session: 60 min  Stress: No Stress Concern Present (07/15/2022)   [place]    Feeling of Stress : Not at all  Social Connections: Moderately Integrated (07/15/2022)   Social Connection and Isolation Panel [NHANES]    Frequency of Communication with Friends and Family: More than three times a week    Frequency of Social Gatherings with Friends and Family: More than three times a week    Attends Religious Services: More than 4 times per year    Active Member of [employer] or Organizations: No    Attends Music therapist: More than 4 times per year    Marital Status: Divorced    Tobacco Counseling Counseling given: No   Clinical Intake:  Pre-visit preparation completed: No  Pain : 0-10 Pain Score: 7  Pain Type: Chronic pain Pain Location: Arm Pain Orientation: Right Pain Descriptors / Indicators: Aching, Throbbing Pain Onset: More than a month ago Pain Frequency: Occasional     Nutritional Status: BMI 25 -29 Overweight Diabetes: No  How often do you need to have someone help you when you read instructions, pamphlets, or other written materials from your doctor or pharmacy?: 1 - Never  Diabetic?No     Information entered by :: Donnie Mesa, CMA   Activities of Daily Living    07/15/2022    1:18 PM 01/23/2022    8:30 AM  In your present state of health, do you have any difficulty performing the following activities:  Hearing? 1 0  Vision? 0 1  Difficulty concentrating or making decisions? 0 0  Walking or climbing stairs? 1 0  Dressing or bathing? 0 0  Doing errands, shopping? 0 0    Patient Care  Team: Juline Patch, MD as PCP - General (Family Medicine) Zara Council as Physician Assistant (Orthopedic Surgery) Bary Castilla [place] Gleason, MD (General Surgery)  Indicate any recent Medical Services you may have received from other than [facility] providers in the past year (date may be approximate).    No hospitalization in the past 12 months.  Assessment:   This is a routine wellness examination for Jennifer.  Hearing/Vision screen Denies an hearing issues. Annual eye Exam. No vision issues.   Dietary issues and exercise activities discussed: Current Exercise Habits: Structured exercise class, Type of exercise: Other -  see comments (swimming), Time (Minutes): 60, Frequency (Times/Week): 4, Weekly Exercise (Minutes/Week): 240, Intensity: Moderate   Goals Addressed   None    Depression Screen    07/15/2022    1:18 PM 01/23/2022    8:28 AM 07/25/2021    8:01 AM 06/30/2021    9:32 AM 06/20/2020    8:15 AM 05/29/2020    8:24 AM 10/31/2019    3:17 PM  PHQ 2/9 Scores  PHQ - 2 Score 0 0 0 0 0 0 0  PHQ- 9 Score 4 0 0  0  0    Fall Risk    07/15/2022    1:18 PM 06/30/2021    9:36 AM 06/20/2020    8:15 AM 05/29/2020    8:26 AM 10/31/2019    3:17 PM  Fall Risk   Falls in the past year? 0 0 0 0 0  Number falls in past yr: 0 0  0   Injury with Fall? 0 0  0   Risk for fall due to : No Fall Risks No Fall Risks  No Fall Risks   Follow up Falls evaluation completed Falls prevention discussed Falls evaluation completed Falls prevention discussed Falls evaluation completed    [place]:  Any stairs in or around the home? No  If so, are there any without handrails? No stair  Home free of loose throw rugs in walkways, pet beds, electrical cords, etc? Yes  Adequate lighting in your home to reduce risk of falls? Yes   ASSISTIVE DEVICES UTILIZED TO PREVENT FALLS:  Life alert? No  Use of a cane, walker or w/c? No  Grab bars in the bathroom? Yes  Shower  chair or bench in shower? No  Elevated toilet seat or a handicapped toilet? No   TIMED UP AND GO:  Was the test performed? Yes .  Length of time to ambulate 10 feet: 10  sec.   Gait steady and fast without use of assistive device  Cognitive Function:        07/15/2022    1:21 PM  6CIT Screen  What Year? 0 points  What month? 0 points  What time? 0 points  Count back from 20 0 points  Months in reverse 0 points  Repeat phrase 0 points  Total Score 0 points    Immunizations Immunization History  Administered Date(s) Administered   Fluad Quad(high Dose 65+) 06/20/2020, 06/30/2021   Influenza Inj Mdck Quad Pf 06/22/2018   Influenza, High Dose Seasonal PF 05/10/2019   Influenza,inj,Quad PF,6+ Mos 08/14/2015, 07/15/2016, 07/06/2017   Influenza-Unspecified 04/29/2019   Moderna Covid-19 Vaccine Bivalent Booster 69yr & up 07/25/2021   PFIZER Comirnaty(Gray Top)Covid-19 Tri-Sucrose Vaccine 01/22/2021   PFIZER(Purple Top)SARS-COV-2 Vaccination 11/13/2019, 12/04/2019, 07/08/2020   PNEUMOCOCCAL CONJUGATE-20 12/16/2021   PPD Test 06/25/2020   Pneumococcal Conjugate-13 08/06/2020   Tdap 05/29/2014   Zoster Recombinat (Shingrix) 01/26/2018, 04/04/2018    TDAP status: Up to date  Flu Vaccine status: Due, Education has been provided regarding the importance of this vaccine. Advised may receive this vaccine at local pharmacy or Health Dept. Aware to provide a copy of the vaccination record if obtained from local pharmacy or Health Dept. Verbalized acceptance and understanding.  Pneumococcal vaccine status: Up to date  Covid-19 vaccine status: Information provided on how to obtain vaccines.   Qualifies for Shingles Vaccine? Yes   Zostavax completed Yes   Shingrix Completed?: Yes  Screening Tests Health Maintenance  Topic Date Due  COVID-19 Vaccine (6 - Pfizer risk series) 09/19/2021   INFLUENZA VACCINE  04/28/2022   Hepatitis C Screening  07/25/2022 (Originally 04/12/1974)    MAMMOGRAM  07/16/2023   TETANUS/TDAP  09/28/2026   COLONOSCOPY (Pts 45-60yr Insurance coverage will need to be confirmed)  11/13/2027   Pneumonia Vaccine 68 Years old  Completed   DEXA SCAN  Completed   Zoster Vaccines- Shingrix  Completed   HPV VACCINES  Aged Out    Health Maintenance  Health Maintenance Due  Topic Date Due   COVID-19 Vaccine (6 - Pfizer risk series) 09/19/2021   INFLUENZA VACCINE  04/28/2022    Colorectal cancer screening: Type of screening: Colonoscopy. Completed 11/12/2017. Repeat every 10 years  Mammogram status: Completed 07/15/2021. Repeat every year  DEXA Scan: 06/13/2020  Lung Cancer Screening: (Low Dose CT Chest recommended if Age 68-80years, 30 pack-year currently smoking OR have quit w/in 15years.) does qualify.     Additional Screening:  Hepatitis C Screening: does qualify; due   Vision Screening: Recommended annual ophthalmology exams for early detection of glaucoma and other disorders of the eye. Is the patient up to date with their annual eye exam?  Yes  Who is the provider or what is the name of the office in which the patient attends annual eye exams?  If pt is not established with a provider, would they like to be referred to a provider to establish care? No .   Dental Screening: Recommended annual dental exams for proper oral hygiene  Community Resource Referral / Chronic Care Management: CRR required this visit?  No   CCM required this visit?  No      Plan:     I have personally reviewed and noted the following in the patient's chart:   Medical and social history Use of alcohol, tobacco or illicit drugs  Current medications and supplements including opioid prescriptions. Patient is not currently taking opioid prescriptions. Functional ability and status Nutritional status Physical activity Advanced directives List of other physicians Hospitalizations, surgeries, and ER visits in previous 12  months Vitals Screenings to include cognitive, depression, and falls Referrals and appointments  In addition, I have reviewed and discussed with patient certain preventive protocols, quality metrics, and best practice recommendations. A written personalized care plan for preventive services as well as general preventive health recommendations were provided to patient.    Ms. WMainwaring, Thank you for taking time to come for your Medicare Wellness Visit. I appreciate your ongoing commitment to your health goals. Please review the following plan we discussed and let me know if I can assist you in the future.   These are the goals we discussed:  Goals      Weight (lb) < 150 lb (68 kg)     Pt states she would like to lose 25 lbs over the next year with healthy eating and physical activity         This is a list of the screening recommended for you and due dates:  Health Maintenance  Topic Date Due   COVID-19 Vaccine (6 - Pfizer risk series) 09/19/2021   Flu Shot  04/28/2022   Hepatitis C Screening: USPSTF Recommendation to screen - Ages 18-79 yo.  07/25/2022*   Mammogram  07/16/2023   Tetanus Vaccine  09/28/2026   Colon Cancer Screening  11/13/2027   Pneumonia Vaccine  Completed   DEXA scan (bone density measurement)  Completed   Zoster (Shingles) Vaccine  Completed   HPV Vaccine  Aged  Out  *Topic was postponed. The date shown is not the original due date.     Wilson Singer, [place]   07/15/2022   Nurse Notes: Approximately 30 minute Face-To-Face Visit

## 2022-07-20 ENCOUNTER — Other Ambulatory Visit: Payer: Self-pay | Admitting: Family Medicine

## 2022-07-20 DIAGNOSIS — Z1231 Encounter for screening mammogram for malignant neoplasm of breast: Secondary | ICD-10-CM

## 2022-07-22 ENCOUNTER — Ambulatory Visit
Admission: RE | Admit: 2022-07-22 | Discharge: 2022-07-22 | Disposition: A | Discharge status: Home / Self Care | Payer: Medicare Other | Source: Ambulatory Visit | Attending: Orthopedic Surgery | Admitting: Orthopedic Surgery

## 2022-07-22 DIAGNOSIS — M7551 Bursitis of right shoulder: Secondary | ICD-10-CM

## 2022-07-22 DIAGNOSIS — M25511 Pain in right shoulder: Secondary | ICD-10-CM | POA: Diagnosis not present

## 2022-07-22 DIAGNOSIS — M25311 Other instability, right shoulder: Secondary | ICD-10-CM

## 2022-07-22 DIAGNOSIS — G8929 Other chronic pain: Secondary | ICD-10-CM

## 2022-07-23 DIAGNOSIS — M25511 Pain in right shoulder: Secondary | ICD-10-CM | POA: Diagnosis not present

## 2022-07-26 ENCOUNTER — Other Ambulatory Visit: Payer: Self-pay | Admitting: Family Medicine

## 2022-07-26 DIAGNOSIS — E7801 Familial hypercholesterolemia: Secondary | ICD-10-CM

## 2022-07-27 DIAGNOSIS — M75121 Complete rotator cuff tear or rupture of right shoulder, not specified as traumatic: Secondary | ICD-10-CM | POA: Diagnosis not present

## 2022-08-05 ENCOUNTER — Ambulatory Visit
Admission: RE | Admit: 2022-08-05 | Discharge: 2022-08-05 | Disposition: A | Discharge status: Home / Self Care | Payer: Medicare Other | Source: Ambulatory Visit | Attending: Family Medicine | Admitting: Family Medicine

## 2022-08-05 ENCOUNTER — Other Ambulatory Visit: Payer: Self-pay | Admitting: Family Medicine

## 2022-08-05 DIAGNOSIS — Z1231 Encounter for screening mammogram for malignant neoplasm of breast: Secondary | ICD-10-CM | POA: Diagnosis not present

## 2022-08-05 DIAGNOSIS — K219 Gastro-esophageal reflux disease without esophagitis: Secondary | ICD-10-CM

## 2022-08-05 NOTE — Telephone Encounter (Signed)
Requested Prescriptions  Pending Prescriptions Disp Refills   pantoprazole (PROTONIX) 40 MG tablet [Pharmacy Med Name: PANTOPRAZOLE SOD DR 40 MG TAB] 90 tablet 1    Sig: TAKE 1 TABLET BY MOUTH EVERY DAY     Gastroenterology: Proton Pump Inhibitors Passed - 08/05/2022  2:35 AM      Passed - Valid encounter within last 12 months    Recent Outpatient Visits           6 months ago Familial hypercholesterolemia   [facility] [place], [place], MD   7 months ago Cellulitis of left lower extremity   [facility] [place], Deanna C, MD   1 year ago Gastroesophageal reflux disease   [facility] [place], Deanna C, MD   1 year ago Patellofemoral pain syndrome of both knees   [facility] [place], Deanna C, MD   1 year ago Lumbar degenerative disc disease   [facility] [place], [place], MD       Future Appointments             In 2 weeks Juline Patch, MD [facility], [facility]

## 2022-08-11 ENCOUNTER — Other Ambulatory Visit: Payer: Self-pay | Admitting: Orthopedic Surgery

## 2022-08-14 ENCOUNTER — Ambulatory Visit: Payer: Medicare Other | Admitting: Family Medicine

## 2022-08-18 ENCOUNTER — Encounter: Payer: Self-pay | Admitting: Orthopedic Surgery

## 2022-08-18 NOTE — Discharge Instructions (Addendum)
Post-Op Instructions - Rotator Cuff Repair  1. Bracing: You will wear a shoulder immobilizer or sling for 6 weeks.   2. Driving: No driving for 3 weeks post-op. When driving, do not wear the immobilizer. Ideally, we recommend no driving for 6 weeks while sling is in place as one arm will be immobilized.   3. Activity: No active lifting for 2 months. Wrist, hand, and elbow motion only. Avoid lifting the upper arm away from the body except for hygiene. You are permitted to bend and straighten the elbow passively only (no active elbow motion). You may use your hand and wrist for typing, writing, and managing utensils (cutting food). Do not lift more than a coffee cup for 8 weeks.  When sleeping or resting, inclined positions (recliner chair or wedge pillow) and a pillow under the forearm for support may provide better comfort for up to 4 weeks.  Avoid long distance travel for 4 weeks.  Return to normal activities after rotator cuff repair repair normally takes 6 months on average. If rehab goes very well, may be able to do most activities at 4 months, except overhead or contact sports.  4. Physical Therapy: Begins 3-4 days after surgery, and proceed 1 time per week for the first 6 weeks, then 1-2 times per week from weeks 6-20 post-op.  5. Medications:  - You will be provided a prescription for narcotic pain medicine. After surgery, take 1-2 narcotic tablets every 4 hours if needed for severe pain.  - A prescription for anti-nausea medication will be provided in case the narcotic medicine causes nausea - take 1 tablet every 6 hours only if nauseated.   - Take tylenol 1000 mg (2 Extra Strength tablets or 3 regular strength) every 8 hours for pain.  May decrease or stop tylenol 5 days after surgery if you are having minimal pain. - Take ASA 325mg/day x 2 weeks to help prevent DVTs/PEs (blood clots).  - DO NOT take ANY nonsteroidal anti-inflammatory pain medications (Advil, Motrin, Ibuprofen, Aleve,  Naproxen, or Naprosyn). These medicines can inhibit healing of your shoulder repair.    If you are taking prescription medication for anxiety, depression, insomnia, muscle spasm, chronic pain, or for attention deficit disorder, you are advised that you are at a higher risk of adverse effects with use of narcotics post-op, including narcotic addiction/dependence, depressed breathing, death. If you use non-prescribed substances: alcohol, marijuana, cocaine, heroin, methamphetamines, etc., you are at a higher risk of adverse effects with use of narcotics post-op, including narcotic addiction/dependence, depressed breathing, death. You are advised that taking > 50 morphine milligram equivalents (MME) of narcotic pain medication per day results in twice the risk of overdose or death. For your prescription provided: oxycodone 5 mg - taking more than 6 tablets per day would result in > 50 morphine milligram equivalents (MME) of narcotic pain medication. Be advised that we will prescribe narcotics short-term, for acute post-operative pain only - 3 weeks for major operations such as shoulder repair/reconstruction surgeries.     6. Post-Op Appointment:  Your first post-op appointment will be 10-14 days post-op.  7. Work or School: For most, but not all procedures, we advise staying out of work or school for at least 1 to 2 weeks in order to recover from the stress of surgery and to allow time for healing.   If you need a work or school note this can be provided.   8. Smoking: If you are a smoker, you need to refrain from   smoking in the postoperative period. The nicotine in cigarettes will inhibit healing of your shoulder repair and decrease the chance of successful repair. Similarly, nicotine containing products (gum, patches) should be avoided.   Post-operative Brace: Apply and remove the brace you received as you were instructed to at the time of fitting and as described in detail as the brace's  instructions for use indicate.  Wear the brace for the period of time prescribed by your physician.  The brace can be cleaned with soap and water and allowed to air dry only.  Should the brace result in increased pain, decreased feeling (numbness/tingling), increased swelling or an overall worsening of your medical condition, please contact your doctor immediately.  If an emergency situation occurs as a result of wearing the brace after normal business hours, please dial 911 and seek immediate medical attention.  Let your doctor know if you have any further questions about the brace issued to you. Refer to the shoulder sling instructions for use if you have any questions regarding the correct fit of your shoulder sling.  [facility]: [phone]  Video that illustrates how to properly use a shoulder sling: "Instructions for Proper Use of an Orthopaedic Sling" [email or web address]       PERIPHERAL NERVE BLOCK PATIENT INFORMATION  Your surgeon has requested a peripheral nerve block for your surgery. This anesthetic technique provides excellent post-operative pain relief for you in a safe and effective manner. It will also help reduce the risk of nausea and vomiting and allow earlier discharge from the hospital.   The block is performed under sedation with ultrasound guidance prior to your procedure. Due to the sedation, your may or may not remember the block experience. The nerve block will begin to take effect anywhere from 5 to 30 minutes after being administered. You will be transported to the operating room from your surgery after the block is completed.   At the end of surgery, when the anesthesia wears off, you will notice a few things. Your may not be able to move or feel the part of your body targeted by the nerve block. These are normal experiences, and they will disappear as the block wears off.  If you had an interscalene nerve block  performed (which is common for shoulder surgery), your voice can be very hoarse and you may feel that you are not able to take as deep a breath as you did before surgery. Some patients may also notice a droopy eyelid on the affected side. These symptoms will resolve once the block wears off.  Pain control: The nerve block technique used is a single injection that can last anywhere from 1-3 days. The duration of the numbness can vary between individuals. After leaving the hospital, it is important that you begin to take your prescribed pain medication when you start to sense the nerve block wearing off. This will help you avoid unpleasant pain at the time the nerve block wears off, which can sometimes be in the middle of the night. The block will only cover pain in the areas targeted by the nerve block so if you experience surgical pain outside of that area, please take your prescribed pain medication. Management of the "numb area": After a nerve block, you cannot feel pain, pressure, or temperature in the affected area so there is an increased risk for injury. You should take extra care to protect the affected areas until sensation and movement returns. Please take caution to not come  in contact with extremely hot or cold items because you will not be able to sense or protect yourself form the extremes of temperature.  You may experience some persistent numbness after the procedure by most neurological deficits resolve over time and the incidence of serious long term neurological complications attributable to peripheral nerve blocks are relatively uncommon.       Information for Discharge Teaching: EXPAREL (bupivacaine liposome injectable suspension)   Your surgeon or anesthesiologist gave you EXPAREL(bupivacaine) to help control your pain after surgery.  EXPAREL is a local anesthetic that provides pain relief by numbing the tissue around the surgical site. EXPAREL is designed to release pain  medication over time and can control pain for up to 72 hours. Depending on how you respond to EXPAREL, you may require less pain medication during your recovery.  Possible side effects: Temporary loss of sensation or ability to move in the area where bupivacaine was injected. Nausea, vomiting, constipation Rarely, numbness and tingling in your mouth or lips, lightheadedness, or anxiety may occur. Call your doctor right away if you think you may be experiencing any of these sensations, or if you have other questions regarding possible side effects.  Follow all other discharge instructions given to you by your surgeon or nurse. Eat a healthy diet and drink plenty of water or other fluids.  If you return to the hospital for any reason within 96 hours following the administration of EXPAREL, it is important for health care providers to know that you have received this anesthetic. A teal colored band has been placed on your arm with the date, time and amount of EXPAREL you have received in order to alert and inform your health care providers. Please leave this armband in place for the full 96 hours following administration, and then you may remove the band.     POLAR CARE INFORMATION  [email or web address]  How to use [place] Cold Therapy System?  YouTube   [email or web address]  OPERATING INSTRUCTIONS  Start the product With dry hands, connect the transformer to the electrical connection located on the top of the cooler. Next, plug the transformer into an appropriate electrical outlet. The unit will automatically start running at this point.  To stop the pump, disconnect electrical power.  Unplug to stop the product when not in use. Unplugging the Polar Care unit turns it off. Always unplug immediately after use. Never leave it plugged in while unattended. Remove pad.    FIRST ADD WATER TO FILL LINE, THEN ICE---Replace ice when existing ice is almost  melted  1 Discuss Treatment with your [facility] Practitioner and Use Only as Prescribed 2 Apply Insulation Barrier & Cold Therapy Pad 3 Check for Moisture 4 Inspect Skin Regularly  Tips and Trouble Shooting Usage Tips 1. Use cubed or chunked ice for optimal performance. 2. It is recommended to drain the Pad between uses. To drain the pad, hold the Pad upright with the hose pointed toward the ground. Depress the black plunger and allow water to drain out. 3. You may disconnect the Pad from the unit without removing the pad from the affected area by depressing the silver tabs on the hose coupling and gently pulling the hoses apart. The Pad and unit will seal itself and will not leak. Note: Some dripping during release is normal. 4. DO NOT RUN PUMP WITHOUT WATER! The pump in this unit is designed to run with water. Running the unit without water will cause permanent damage to  the pump. 5. Unplug unit before removing lid.  TROUBLESHOOTING GUIDE Pump not running, Water not flowing to the pad, Pad is not getting cold 1. Make sure the transformer is plugged into the wall outlet. 2. Confirm that the ice and water are filled to the indicated levels. 3. Make sure there are no kinks in the pad. 4. Gently pull on the blue tube to make sure the tube/pad junction is straight. 5. Remove the pad from the treatment site and ll it while the pad is lying at; then reapply. 6. Confirm that the pad couplings are securely attached to the unit. Listen for the double clicks (Figure 1) to confirm the pad couplings are securely attached.  Leaks    Note: Some condensation on the lines, controller, and pads is unavoidable, especially in warmer climates. 1. If using a [place] Polar Care Cold Therapy unit with a detachable Cold Therapy Pad, and a leak exists (other than condensation on the lines) disconnect the pad couplings. Make sure the silver tabs on the couplings are depressed before reconnecting the pad to  the pump hose; then confirm both sides of the coupling are properly clicked in. 2. If the coupling continues to leak or a leak is detected in the pad itself, stop using it and call [facility] at [phone].  Cleaning After use, empty and dry the unit with a soft cloth. Warm water and mild detergent may be used occasionally to clean the pump and tubes.  WARNING: The [place] can be cold enough to cause serious injury, including full skin necrosis. Follow these Operating Instructions, and carefully read the Product Insert (see pouch on side of unit) and the Cold Therapy Pad Fitting Instructions (provided with each Cold Therapy Pad) prior to use.     AMBULATORY SURGERY  DISCHARGE INSTRUCTIONS   The drugs that you were given will stay in your system until tomorrow so for the next 24 hours you should not:  Drive an automobile Make any legal decisions Drink any alcoholic beverage   You may resume regular meals tomorrow.  Today it is better to start with liquids and gradually work up to solid foods.  You may eat anything you prefer, but it is better to start with liquids, then soup and crackers, and gradually work up to solid foods.   Please notify your doctor immediately if you have any unusual bleeding, trouble breathing, redness and pain at the surgery site, drainage, fever, or pain not relieved by medication.    Your post-operative visit with Dr.                                       is: Date:                        Time:    Please call to schedule your post-operative visit.  Additional Instructions:   [place] Abductor sling instructions and diagram: (Blue ball)  Youtube video: https://youtu.be/[email or web address]  Please contact your surgeon's office if you have any questions about this shoulder immobilizer.        POLAR CARE INFORMATION  [email or web address]  How to use [place] Cold Therapy System?  YouTube    [email or web address]  OPERATING INSTRUCTIONS  Start the product With dry hands, connect the transformer to the electrical connection located on the top of the cooler. Next, plug the  transformer into an appropriate electrical outlet. The unit will automatically start running at this point.  To stop the pump, disconnect electrical power.  Unplug to stop the product when not in use. Unplugging the Polar Care unit turns it off. Always unplug immediately after use. Never leave it plugged in while unattended. Remove pad.    FIRST ADD WATER TO FILL LINE, THEN ICE---Replace ice when existing ice is almost melted  1 Discuss Treatment with your [facility] Practitioner and Use Only as Prescribed 2 Apply Insulation Barrier & Cold Therapy Pad 3 Check for Moisture 4 Inspect Skin Regularly  Tips and Trouble Shooting Usage Tips 1. Use cubed or chunked ice for optimal performance. 2. It is recommended to drain the Pad between uses. To drain the pad, hold the Pad upright with the hose pointed toward the ground. Depress the black plunger and allow water to drain out. 3. You may disconnect the Pad from the unit without removing the pad from the affected area by depressing the silver tabs on the hose coupling and gently pulling the hoses apart. The Pad and unit will seal itself and will not leak. Note: Some dripping during release is normal. 4. DO NOT RUN PUMP WITHOUT WATER! The pump in this unit is designed to run with water. Running the unit without water will cause permanent damage to the pump. 5. Unplug unit before removing lid.  TROUBLESHOOTING GUIDE Pump not running, Water not flowing to the pad, Pad is not getting cold 1. Make sure the transformer is plugged into the wall outlet. 2. Confirm that the ice and water are filled to the indicated levels. 3. Make sure there are no kinks in the pad. 4. Gently pull on the blue tube to make sure the tube/pad junction is  straight. 5. Remove the pad from the treatment site and ll it while the pad is lying at; then reapply. 6. Confirm that the pad couplings are securely attached to the unit. Listen for the double clicks (Figure 1) to confirm the pad couplings are securely attached.  Leaks    Note: Some condensation on the lines, controller, and pads is unavoidable, especially in warmer climates. 1. If using a Breg Polar Care Cold Therapy unit with a detachable Cold Therapy Pad, and a leak exists (other than condensation on the lines) disconnect the pad couplings. Make sure the silver tabs on the couplings are depressed before reconnecting the pad to the pump hose; then confirm both sides of the coupling are properly clicked in. 2. If the coupling continues to leak or a leak is detected in the pad itself, stop using it and call [facility] at [phone].  Cleaning After use, empty and dry the unit with a soft cloth. Warm water and mild detergent may be used occasionally to clean the pump and tubes.  WARNING: The [place] can be cold enough to cause serious injury, including full skin necrosis. Follow these Operating Instructions, and carefully read the Product Insert (see pouch on side of unit) and the Cold Therapy Pad Fitting Instructions (provided with each Cold Therapy Pad) prior to use.

## 2022-08-24 ENCOUNTER — Ambulatory Visit: Payer: Medicare Other | Admitting: Family Medicine

## 2022-08-24 DIAGNOSIS — M75121 Complete rotator cuff tear or rupture of right shoulder, not specified as traumatic: Secondary | ICD-10-CM | POA: Diagnosis not present

## 2022-08-25 ENCOUNTER — Encounter: Payer: Self-pay | Admitting: Urgent Care

## 2022-08-25 ENCOUNTER — Encounter
Admission: RE | Admit: 2022-08-25 | Discharge: 2022-08-25 | Disposition: A | Discharge status: Home / Self Care | Payer: Medicare Other | Source: Ambulatory Visit | Attending: Orthopedic Surgery | Admitting: Orthopedic Surgery

## 2022-08-25 VITALS — Ht 59.0 in | Wt 144.0 lb

## 2022-08-25 DIAGNOSIS — Z01818 Encounter for other preprocedural examination: Secondary | ICD-10-CM | POA: Insufficient documentation

## 2022-08-25 DIAGNOSIS — Z01812 Encounter for preprocedural laboratory examination: Secondary | ICD-10-CM

## 2022-08-25 DIAGNOSIS — E7849 Other hyperlipidemia: Secondary | ICD-10-CM

## 2022-08-25 DIAGNOSIS — Z01810 Encounter for preprocedural cardiovascular examination: Secondary | ICD-10-CM

## 2022-08-25 HISTORY — DX: Personal history of other diseases of the digestive system: Z87.19

## 2022-08-25 HISTORY — DX: Zoster without complications: B02.9

## 2022-08-25 HISTORY — DX: Unspecified hemorrhoids: K64.9

## 2022-08-25 LAB — BASIC METABOLIC PANEL
Anion gap: 8 (ref 5–15)
BUN: 17 mg/dL (ref 8–23)
CO2: 23 mmol/L (ref 22–32)
Calcium: 9.1 mg/dL (ref 8.9–10.3)
Chloride: 108 mmol/L (ref 98–111)
Creatinine, Ser: 0.6 mg/dL (ref 0.44–1.00)
GFR, Estimated: >60 mL/min (ref >60)
Glucose, Bld: 96 mg/dL (ref 70–99)
Potassium: 3.6 mmol/L (ref 3.5–5.1)
Sodium: 139 mmol/L (ref 135–145)

## 2022-08-25 LAB — CBC
HCT: 40.3 % (ref 36.0–46.0)
Hemoglobin: 13.8 g/dL (ref 12.0–15.0)
MCH: 29.5 pg (ref 26.0–34.0)
MCHC: 34.2 g/dL (ref 30.0–36.0)
MCV: 86.1 fL (ref 80.0–100.0)
Platelets: 283 10*3/uL (ref 150–400)
RBC: 4.68 MIL/uL (ref 3.87–5.11)
RDW: 12.7 % (ref 11.5–15.5)
WBC: 6.4 10*3/uL (ref 4.0–10.5)
nRBC: 0 % (ref 0.0–0.2)

## 2022-08-25 NOTE — Patient Instructions (Addendum)
Your procedure is scheduled on: Thursday, November 30 Report to the Registration Desk on the 1st floor of the [facility]. To find out your arrival time, please call [phone] between 1PM - 3PM on: Wednesday, November 29 If your arrival time is 6:00 am, do not arrive prior to that time as the [facility] entrance doors do not open until 6:00 am.  REMEMBER: Instructions that are not followed completely may result in serious medical risk, up to and including death; or upon the discretion of your surgeon and anesthesiologist your surgery may need to be rescheduled.  Do not eat food after midnight the night before surgery.  No gum chewing, lozengers or hard candies.  You may however, drink CLEAR liquids up to 2 hours before you are scheduled to arrive for your surgery. Do not drink anything within 2 hours of your scheduled arrival time.  Clear liquids include: - water  - apple juice without pulp - gatorade (not RED colors) - black coffee or tea (Do NOT add milk or creamers to the coffee or tea) Do NOT drink anything that is not on this list.  In addition, your doctor has ordered for you to drink the provided  Ensure Pre-Surgery Clear Carbohydrate Drink or G2 Drinking this carbohydrate drink up to two hours before surgery helps to reduce insulin resistance and improve patient outcomes. Please complete drinking 2 hours prior to scheduled arrival time.  TAKE THESE MEDICATIONS THE MORNING OF SURGERY WITH A SIP OF WATER:  Omeprazole - (take one the night before and one on the morning of surgery - helps to prevent nausea after surgery.) Rosuvastatin (Crestor)  One week prior to surgery: starting now, November 28 Stop Anti-inflammatories (NSAIDS) such as Advil, Aleve, Ibuprofen, Motrin, Naproxen, Naprosyn and Aspirin based products such as Excedrin, Goodys Powder, BC Powder. Stop ANY OVER THE COUNTER supplements until after surgery. You may however, continue to take Tylenol if needed for  pain up until the day of surgery.  No Alcohol for 24 hours before or after surgery.  No Smoking including e-cigarettes for 24 hours prior to surgery.  No chewable tobacco products for at least 6 hours prior to surgery.  No nicotine patches on the day of surgery.  Do not use any "recreational" drugs for at least a week prior to your surgery.  Please be advised that the combination of cocaine and anesthesia may have negative outcomes, up to and including death. If you test positive for cocaine, your surgery will be cancelled.  On the morning of surgery brush your teeth with toothpaste and water, you may rinse your mouth with mouthwash if you wish. Do not swallow any toothpaste or mouthwash.  Use CHG Soap as directed on instruction sheet.  Do not wear jewelry, make-up, hairpins, clips or nail polish.  Do not wear lotions, powders, or perfumes.   Do not shave body from the neck down 48 hours prior to surgery just in case you cut yourself which could leave a site for infection.  Also, freshly shaved skin may become irritated if using the CHG soap.  Contact lenses, hearing aids and dentures may not be worn into surgery.  Do not bring valuables to the hospital. [facility] is not responsible for any missing/lost belongings or valuables.   Notify your doctor if there is any change in your medical condition (cold, fever, infection).  Wear comfortable clothing (specific to your surgery type) to the hospital.  After surgery, you can help prevent lung complications by  doing breathing exercises.  Take deep breaths and cough every 1-2 hours. Your doctor may order a device called an Incentive Spirometer to help you take deep breaths.  If you are being discharged the day of surgery, you will not be allowed to drive home. You will need a responsible adult (18 years or older) to drive you home and stay with you that night.   If you are taking public transportation, you will need to have a  responsible adult (18 years or older) with you. Please confirm with your physician that it is acceptable to use public transportation.   Please call the [facility] at [phone] if you have any questions about these instructions.  Surgery Visitation Policy:  Patients undergoing a surgery or procedure may have two family members or support persons with them as long as the person is not COVID-19 positive or experiencing its symptoms.      Preparing for Surgery with CHLORHEXIDINE GLUCONATE (CHG) Soap  Chlorhexidine Gluconate (CHG) Soap  o An antiseptic cleaner that kills germs and bonds with the skin to continue killing germs even after washing  o Used for showering the night before surgery and morning of surgery  Before surgery, you can play an important role by reducing the number of germs on your skin.  CHG (Chlorhexidine gluconate) soap is an antiseptic cleanser which kills germs and bonds with the skin to continue killing germs even after washing.  Please do not use if you have an allergy to CHG or antibacterial soaps. If your skin becomes reddened/irritated stop using the CHG.  1. Shower the NIGHT BEFORE SURGERY and the MORNING OF SURGERY with CHG soap.  2. If you choose to wash your hair, wash your hair first as usual with your normal shampoo.  3. After shampooing, rinse your hair and body thoroughly to remove the shampoo.  4. Use CHG as you would any other liquid soap. You can apply CHG directly to the skin and wash gently with a scrungie or a clean washcloth.  5. Apply the CHG soap to your body only from the neck down. Do not use on open wounds or open sores. Avoid contact with your eyes, ears, mouth, and genitals (private parts). Wash face and genitals (private parts) with your normal soap.  6. Wash thoroughly, paying special attention to the area where your surgery will be performed.  7. Thoroughly rinse your body with warm water.  8. Do not  shower/wash with your normal soap after using and rinsing off the CHG soap.  9. Pat yourself dry with a clean towel.  10. Wear clean pajamas to bed the night before surgery.  12. Place clean sheets on your bed the night of your first shower and do not sleep with pets.  13. Shower again with the CHG soap on the day of surgery prior to arriving at the hospital.  14. Do not apply any deodorants/lotions/powders.  15. Please wear clean clothes to the hospital.

## 2022-08-27 ENCOUNTER — Ambulatory Visit: Payer: Medicare Other

## 2022-08-27 ENCOUNTER — Ambulatory Visit: Payer: Medicare Other | Admitting: General Practice

## 2022-08-27 ENCOUNTER — Other Ambulatory Visit: Payer: Self-pay

## 2022-08-27 ENCOUNTER — Encounter: Payer: Self-pay | Admitting: Orthopedic Surgery

## 2022-08-27 ENCOUNTER — Ambulatory Visit
Admission: RE | Admit: 2022-08-27 | Discharge: 2022-08-27 | Disposition: A | Discharge status: Home / Self Care | Payer: Medicare Other | Attending: Orthopedic Surgery | Admitting: Orthopedic Surgery

## 2022-08-27 ENCOUNTER — Encounter
Admission: RE | Disposition: A | Discharge status: Home / Self Care | Payer: Self-pay | Source: Home / Self Care | Attending: Orthopedic Surgery

## 2022-08-27 DIAGNOSIS — S46211A Strain of muscle, fascia and tendon of other parts of biceps, right arm, initial encounter: Secondary | ICD-10-CM | POA: Diagnosis not present

## 2022-08-27 DIAGNOSIS — M75101 Unspecified rotator cuff tear or rupture of right shoulder, not specified as traumatic: Secondary | ICD-10-CM | POA: Diagnosis not present

## 2022-08-27 DIAGNOSIS — G8918 Other acute postprocedural pain: Secondary | ICD-10-CM | POA: Diagnosis not present

## 2022-08-27 DIAGNOSIS — M25811 Other specified joint disorders, right shoulder: Secondary | ICD-10-CM | POA: Insufficient documentation

## 2022-08-27 DIAGNOSIS — K219 Gastro-esophageal reflux disease without esophagitis: Secondary | ICD-10-CM | POA: Diagnosis not present

## 2022-08-27 DIAGNOSIS — K449 Diaphragmatic hernia without obstruction or gangrene: Secondary | ICD-10-CM | POA: Diagnosis not present

## 2022-08-27 DIAGNOSIS — X58XXXA Exposure to other specified factors, initial encounter: Secondary | ICD-10-CM | POA: Insufficient documentation

## 2022-08-27 DIAGNOSIS — M7581 Other shoulder lesions, right shoulder: Secondary | ICD-10-CM | POA: Diagnosis not present

## 2022-08-27 DIAGNOSIS — M7541 Impingement syndrome of right shoulder: Secondary | ICD-10-CM | POA: Diagnosis not present

## 2022-08-27 DIAGNOSIS — M75121 Complete rotator cuff tear or rupture of right shoulder, not specified as traumatic: Secondary | ICD-10-CM | POA: Diagnosis not present

## 2022-08-27 HISTORY — PX: SHOULDER ARTHROSCOPY: SHX128

## 2022-08-27 HISTORY — DX: Other complications of anesthesia, initial encounter: T88.59XA

## 2022-08-27 HISTORY — DX: Gastro-esophageal reflux disease without esophagitis: K21.9

## 2022-08-27 HISTORY — DX: Unspecified osteoarthritis, unspecified site: M19.90

## 2022-08-27 SURGERY — ARTHROSCOPY, SHOULDER
Anesthesia: General | Site: Shoulder | Laterality: Right

## 2022-08-27 MED ORDER — ROCURONIUM BROMIDE 10 MG/ML (PF) SYRINGE
PREFILLED_SYRINGE | INTRAVENOUS | Status: AC
  Filled 2022-08-27: qty 10

## 2022-08-27 MED ORDER — EPINEPHRINE PF 1 MG/ML IJ SOLN
INTRAMUSCULAR | Status: AC
  Filled 2022-08-27: qty 4

## 2022-08-27 MED ORDER — FENTANYL CITRATE (PF) 100 MCG/2ML IJ SOLN
INTRAMUSCULAR | Status: DC | PRN
  Administered 2022-08-27: 50 ug via INTRAVENOUS
  Administered 2022-08-27 (×2): 25 ug via INTRAVENOUS

## 2022-08-27 MED ORDER — DEXAMETHASONE SODIUM PHOSPHATE 10 MG/ML IJ SOLN
INTRAMUSCULAR | Status: DC | PRN
  Administered 2022-08-27: 10 mg via INTRAVENOUS

## 2022-08-27 MED ORDER — ACETAMINOPHEN 500 MG PO TABS: 1000.0000 mg | ORAL_TABLET | Freq: Three times a day (TID) | ORAL | 2 refills | Status: AC

## 2022-08-27 MED ORDER — RINGERS IRRIGATION IR SOLN
Status: DC | PRN
  Administered 2022-08-27: 6000 mL
  Administered 2022-08-27: 12000 mL
  Administered 2022-08-27 (×4): 3000 mL
  Administered 2022-08-27: 6000 mL
  Administered 2022-08-27 (×7): 3000 mL

## 2022-08-27 MED ORDER — ORAL CARE MOUTH RINSE: 15.0000 mL | Freq: Once | OROMUCOSAL | Status: AC

## 2022-08-27 MED ORDER — BUPIVACAINE LIPOSOME 1.3 % IJ SUSP
INTRAMUSCULAR | Status: DC | PRN
  Administered 2022-08-27: 10 mL via PERINEURAL

## 2022-08-27 MED ORDER — BUPIVACAINE HCL (PF) 0.5 % IJ SOLN
INTRAMUSCULAR | Status: DC | PRN
  Administered 2022-08-27: 10 mL via PERINEURAL

## 2022-08-27 MED ORDER — ACETAMINOPHEN 10 MG/ML IV SOLN: 1000.0000 mg | Freq: Once | INTRAVENOUS | Status: DC | PRN

## 2022-08-27 MED ORDER — ASPIRIN 325 MG PO TBEC: 325.0000 mg | DELAYED_RELEASE_TABLET | Freq: Every day | ORAL | 0 refills | Status: AC

## 2022-08-27 MED ORDER — PROPOFOL 10 MG/ML IV BOLUS
INTRAVENOUS | Status: DC | PRN
  Administered 2022-08-27: 150 mg via INTRAVENOUS

## 2022-08-27 MED ORDER — ONDANSETRON HCL 4 MG/2ML IJ SOLN
INTRAMUSCULAR | Status: AC
  Filled 2022-08-27: qty 2

## 2022-08-27 MED ORDER — FENTANYL CITRATE (PF) 100 MCG/2ML IJ SOLN: 25.0000 ug | INTRAMUSCULAR | Status: DC | PRN

## 2022-08-27 MED ORDER — PHENYLEPHRINE HCL (PRESSORS) 10 MG/ML IV SOLN
INTRAVENOUS | Status: AC
  Filled 2022-08-27: qty 1

## 2022-08-27 MED ORDER — CEFAZOLIN SODIUM-DEXTROSE 2-4 GM/100ML-% IV SOLN
2.0000 g | INTRAVENOUS | Status: AC
  Administered 2022-08-27: 2 g via INTRAVENOUS

## 2022-08-27 MED ORDER — EPINEPHRINE PF 1 MG/ML IJ SOLN
INTRAMUSCULAR | Status: DC | PRN
  Administered 2022-08-27: 4 mg

## 2022-08-27 MED ORDER — PROPOFOL 10 MG/ML IV BOLUS
INTRAVENOUS | Status: AC
  Filled 2022-08-27: qty 40

## 2022-08-27 MED ORDER — ONDANSETRON HCL 4 MG/2ML IJ SOLN
4.0000 mg | Freq: Once | INTRAMUSCULAR | Status: AC | PRN
  Administered 2022-08-27: 4 mg via INTRAVENOUS

## 2022-08-27 MED ORDER — MIDAZOLAM HCL 2 MG/2ML IJ SOLN
1.0000 mg | Freq: Once | INTRAMUSCULAR | Status: AC
  Administered 2022-08-27: 1 mg via INTRAVENOUS

## 2022-08-27 MED ORDER — ACETAMINOPHEN 10 MG/ML IV SOLN
INTRAVENOUS | Status: AC
  Filled 2022-08-27: qty 100

## 2022-08-27 MED ORDER — SEVOFLURANE IN SOLN
RESPIRATORY_TRACT | Status: AC
  Filled 2022-08-27: qty 250

## 2022-08-27 MED ORDER — OXYCODONE HCL 5 MG PO TABS: 5.0000 mg | ORAL_TABLET | ORAL | 0 refills | Status: DC | PRN

## 2022-08-27 MED ORDER — LACTATED RINGERS IV SOLN: INTRAVENOUS | Status: DC

## 2022-08-27 MED ORDER — CHLORHEXIDINE GLUCONATE 0.12 % MT SOLN: 15.0000 mL | Freq: Once | OROMUCOSAL | Status: AC

## 2022-08-27 MED ORDER — ROCURONIUM BROMIDE 100 MG/10ML IV SOLN
INTRAVENOUS | Status: DC | PRN
  Administered 2022-08-27: 20 mg via INTRAVENOUS
  Administered 2022-08-27: 50 mg via INTRAVENOUS
  Administered 2022-08-27: 30 mg via INTRAVENOUS

## 2022-08-27 MED ORDER — OXYCODONE HCL 5 MG/5ML PO SOLN: 5.0000 mg | Freq: Once | ORAL | Status: DC | PRN

## 2022-08-27 MED ORDER — PHENYLEPHRINE HCL-NACL 20-0.9 MG/250ML-% IV SOLN
INTRAVENOUS | Status: DC | PRN
  Administered 2022-08-27: 35 ug/min via INTRAVENOUS

## 2022-08-27 MED ORDER — FENTANYL CITRATE (PF) 100 MCG/2ML IJ SOLN
INTRAMUSCULAR | Status: AC
  Filled 2022-08-27: qty 2

## 2022-08-27 MED ORDER — ACETAMINOPHEN 10 MG/ML IV SOLN
INTRAVENOUS | Status: DC | PRN
  Administered 2022-08-27: 1000 mg via INTRAVENOUS

## 2022-08-27 MED ORDER — CHLORHEXIDINE GLUCONATE 0.12 % MT SOLN
OROMUCOSAL | Status: AC
  Administered 2022-08-27: 15 mL via OROMUCOSAL
  Filled 2022-08-27: qty 15

## 2022-08-27 MED ORDER — BUPIVACAINE LIPOSOME 1.3 % IJ SUSP
INTRAMUSCULAR | Status: AC
  Filled 2022-08-27: qty 10

## 2022-08-27 MED ORDER — MIDAZOLAM HCL 2 MG/2ML IJ SOLN: 1.0000 mg | Freq: Once | INTRAMUSCULAR | Status: AC

## 2022-08-27 MED ORDER — CEFAZOLIN SODIUM-DEXTROSE 2-4 GM/100ML-% IV SOLN
INTRAVENOUS | Status: AC
  Filled 2022-08-27: qty 100

## 2022-08-27 MED ORDER — BUPIVACAINE HCL (PF) 0.5 % IJ SOLN
INTRAMUSCULAR | Status: AC
  Filled 2022-08-27: qty 10

## 2022-08-27 MED ORDER — ONDANSETRON 4 MG PO TBDP: 4.0000 mg | ORAL_TABLET | Freq: Three times a day (TID) | ORAL | 0 refills | Status: DC | PRN

## 2022-08-27 MED ORDER — EPINEPHRINE PF 1 MG/ML IJ SOLN
INTRAMUSCULAR | Status: AC
  Filled 2022-08-27: qty 1

## 2022-08-27 MED ORDER — OXYCODONE HCL 5 MG PO TABS: 5.0000 mg | ORAL_TABLET | Freq: Once | ORAL | Status: DC | PRN

## 2022-08-27 MED ORDER — SUGAMMADEX SODIUM 200 MG/2ML IV SOLN
INTRAVENOUS | Status: DC | PRN
  Administered 2022-08-27: 200 mg via INTRAVENOUS

## 2022-08-27 MED ORDER — MIDAZOLAM HCL 2 MG/2ML IJ SOLN
INTRAMUSCULAR | Status: AC
  Administered 2022-08-27: 1 mg via INTRAVENOUS
  Filled 2022-08-27: qty 2

## 2022-08-27 SURGICAL SUPPLY — 50 items
ANCHOR ICONIX SPEED 2.0 TAPE (Anchor) IMPLANT
ANCHOR SUT BIO SW 4.75X19.1 (Anchor) IMPLANT
ANCHOR SWIVELOCK BIO 4.75X19.1 (Anchor) IMPLANT
ANCHOR SWIVELOCK BIO COMP (Anchor) IMPLANT
BLADE SHAVER 4.5X7 STR FR (MISCELLANEOUS) ×1 IMPLANT
BUR BR 5.5 WIDE MOUTH (BURR) ×1 IMPLANT
CANNULA PART THRD DISP 5.75X7 (CANNULA) ×1 IMPLANT
CANNULA TWIST IN 8.25X7CM (CANNULA) IMPLANT
CANNULA TWIST IN 8.25X9CM (CANNULA) IMPLANT
CHLORAPREP W/TINT 26 (MISCELLANEOUS) ×1 IMPLANT
COOLER POLAR GLACIER W/PUMP (MISCELLANEOUS) ×1 IMPLANT
DRAPE U-SHAPE 48X52 POLY STRL (PACKS) ×2 IMPLANT
ELECT REM PT RETURN 9FT ADLT (ELECTROSURGICAL) ×1
ELECTRODE REM PT RTRN 9FT ADLT (ELECTROSURGICAL) ×1 IMPLANT
GAUZE SPONGE 4X4 12PLY STRL (GAUZE/BANDAGES/DRESSINGS) ×1 IMPLANT
GAUZE XEROFORM 1X8 LF (GAUZE/BANDAGES/DRESSINGS) ×1 IMPLANT
GLOVE SRG 8 PF TXTR STRL LF DI (GLOVE) ×1 IMPLANT
GLOVE SURG ENC MOIS LTX SZ7.5 (GLOVE) ×2 IMPLANT
GLOVE SURG ENC MOIS LTX SZ8 (GLOVE) ×1 IMPLANT
GLOVE SURG UNDER POLY LF SZ8 (GLOVE) ×1
GOWN STRL REUS W/ TWL LRG LVL3 (GOWN DISPOSABLE) ×1 IMPLANT
GOWN STRL REUS W/ TWL XL LVL3 (GOWN DISPOSABLE) ×1 IMPLANT
GOWN STRL REUS W/TWL LRG LVL3 (GOWN DISPOSABLE) ×1
GOWN STRL REUS W/TWL XL LVL3 (GOWN DISPOSABLE) ×1
IV LACTATED RINGER IRRG 3000ML (IV SOLUTION) ×20
IV LR IRRIG 3000ML ARTHROMATIC (IV SOLUTION) ×6 IMPLANT
KIT STABILIZATION SHOULDER (MISCELLANEOUS) ×1 IMPLANT
KIT TURNOVER KIT A (KITS) ×1 IMPLANT
MANIFOLD NEPTUNE II (INSTRUMENTS) ×2 IMPLANT
MASK FACE SPIDER DISP (MASK) ×1 IMPLANT
MAT ABSORB  FLUID 56X50 GRAY (MISCELLANEOUS) ×2
MAT ABSORB FLUID 56X50 GRAY (MISCELLANEOUS) ×2 IMPLANT
PACK ARTHROSCOPY SHOULDER (MISCELLANEOUS) ×1 IMPLANT
PAD WRAPON POLAR SHDR XLG (MISCELLANEOUS) ×1 IMPLANT
PASSER SUT FIRSTPASS SELF (INSTRUMENTS) IMPLANT
SET Y ADAPTER MULIT-BAG IRRIG (MISCELLANEOUS) ×1 IMPLANT
SUT ETHILON 3-0 (SUTURE) IMPLANT
SUT FIBERSNARE 2 CLSD LOOP (SUTURE) IMPLANT
SUT FIBERWIRE #2 38 T-5 BLUE (SUTURE) ×1
SUT MNCRL AB 4-0 PS2 18 (SUTURE) IMPLANT
SUT VIC AB 2-0 CT2 27 (SUTURE) IMPLANT
SUT XBRAID 1.4 BLK/WHT (SUTURE) IMPLANT
SUT XBRAID 1.4 WHITE/BLUE (SUTURE) IMPLANT
SUTURE FIBERWR #2 38 T-5 BLUE (SUTURE) IMPLANT
SYR 5ML LL (SYRINGE) ×1 IMPLANT
TAPE MICROFOAM 4IN (TAPE) ×1 IMPLANT
TUBING INFLOW SET DBFLO PUMP (TUBING) ×1 IMPLANT
TUBING OUTFLOW SET DBLFO PUMP (TUBING) ×1 IMPLANT
WAND WEREWOLF FLOW 90D (MISCELLANEOUS) ×1 IMPLANT
WRAPON POLAR PAD SHDR XLG (MISCELLANEOUS) ×1

## 2022-08-27 NOTE — Anesthesia Preprocedure Evaluation (Addendum)
Anesthesia Evaluation  Patient identified by MRN, date of birth, ID band Patient awake    Reviewed: Allergy & Precautions, NPO status , Patient's Chart, lab work & pertinent test results  History of Anesthesia Complications Negative for: history of anesthetic complications  Airway Mallampati: II  TM Distance: >3 FB Neck ROM: Full    Dental no notable dental hx. (+) Teeth Intact   Pulmonary neg pulmonary ROS, neg sleep apnea, neg COPD, Patient abstained from smoking.Not current smoker   Pulmonary exam normal breath sounds clear to auscultation       Cardiovascular Exercise Tolerance: Good METS(-) hypertension(-) CAD and (-) Past MI negative cardio ROS (-) dysrhythmias  Rhythm:Regular Rate:Normal - Systolic murmurs    Neuro/Psych negative neurological ROS  negative psych ROS   GI/Hepatic hiatal hernia,GERD  Medicated and Controlled,,(+)     (-) substance abuse    Endo/Other  neg diabetes    Renal/GU negative Renal ROS     Musculoskeletal  (+) Arthritis ,    Abdominal   Peds  Hematology   Anesthesia Other Findings Past Medical History: No date: Actinic keratosis No date: Allergy No date: Arthritis No date: Complication of anesthesia     Comment:  hard to wake up No date: GERD (gastroesophageal reflux disease) No date: Hemorrhoids No date: History of hiatal hernia No date: Hyperlipidemia No date: Shingles  Reproductive/Obstetrics                             Anesthesia Physical Anesthesia Plan  ASA: 2  Anesthesia Plan: General   Post-op Pain Management: Regional block* and Ofirmev IV (intra-op)*   Induction: Intravenous  PONV Risk Score and Plan: 3 and Ondansetron, Dexamethasone and Midazolam  Airway Management Planned: Oral ETT  Additional Equipment: None  Intra-op Plan:   Post-operative Plan: Extubation in OR  Informed Consent: I have reviewed the patients History  and Physical, chart, labs and discussed the procedure including the risks, benefits and alternatives for the proposed anesthesia with the patient or authorized representative who has indicated his/her understanding and acceptance.     Dental advisory given  Plan Discussed with: CRNA and Surgeon  Anesthesia Plan Comments: (Discussed risks of anesthesia with patient, including PONV, sore throat, lip/dental/eye damage. Rare risks discussed as well, such as cardiorespiratory and neurological sequelae, and allergic reactions. Discussed the role of CRNA in patient's perioperative care. Patient understands. Discussed r/b/a of interscalene block, including elective nature. Risks discussed: - Rare: bleeding, infection, nerve damage - shortness of breath from hemidiaphragmatic paralysis - unilateral horner's syndrome - poor/non-working blocks - reactions and toxicity to local anesthetic Patient understands and agrees. Consents to voicemail postop. )       Anesthesia Quick Evaluation

## 2022-08-27 NOTE — Transfer of Care (Signed)
Immediate Anesthesia Transfer of Care Note  Patient: Jennifer Kirby  Procedure(s) Performed: Right shoulder arthroscopic versus mini open subscapularis and supraspinatus repair, subacromial decompression, distal clavicle excision, and biceps tenodesis (Right: Shoulder)  Patient Location: PACU  Anesthesia Type:General  Level of Consciousness: awake, drowsy, and patient cooperative  Airway & Oxygen Therapy: Patient Spontanous Breathing and Patient connected to nasal cannula oxygen  Post-op Assessment: Report given to RN and Post -op Vital signs reviewed and stable  Post vital signs: Reviewed and stable  Last Vitals:  Vitals Value Taken Time  BP 125/57 08/27/22 1611  Temp 36.6 C 08/27/22 1611  Pulse 84 08/27/22 1615  Resp 14 08/27/22 1611  SpO2 94 % 08/27/22 1615  Vitals shown include unvalidated device data.  Last Pain:  Vitals:   08/27/22 1611  TempSrc:   PainSc: 0-No pain         Complications: No notable events documented.

## 2022-08-27 NOTE — H&P (Signed)
Paper H&P to be scanned into permanent record. H&P reviewed. No significant changes noted.  

## 2022-08-27 NOTE — Anesthesia Procedure Notes (Signed)
Anesthesia Regional Block: Interscalene brachial plexus block  ? ?Pre-Anesthetic Checklist: , timeout performed,  Correct Patient, Correct Site, Correct Laterality,  Correct Procedure, Correct Position, site marked,  Risks and benefits discussed,  Surgical consent,  Pre-op evaluation,  At surgeon's request and post-op pain management ? ?Laterality: Right ? ?Prep: chloraprep     ?  ?Needles:  ?Injection technique: Single-shot ? ?Needle Type: Echogenic Needle   ? ? ?Needle Length: 4cm  ?Needle Gauge: 25  ? ? ? ?Additional Needles: ? ? ?Procedures:,,,, ultrasound used (permanent image in chart),,    ?Narrative:  ?Injection made incrementally with aspirations every 5 mL. ? ?Performed by: Personally  ?Anesthesiologist: Jasreet Dickie, MD ? ?Additional Notes: ?Patient's chart reviewed and they were deemed appropriate candidate for procedure, at surgeon's request. Patient educated about risks, benefits, and alternatives of the block including but not limited to: temporary or permanent nerve damage, bleeding, infection, damage to surround tissues, pneumothorax, hemidiaphragmatic paralysis, unilateral Horner's syndrome, block failure, local anesthetic toxicity. Patient expressed understanding. A formal time-out was conducted consistent with institution rules. ? ?Monitors were applied, and minimal sedation used (see nursing record). The site was prepped with skin prep and allowed to dry, and sterile gloves were used. A high frequency linear ultrasound probe with probe cover was utilized throughout. C5-7 nerve roots located and appeared anatomically normal, local anesthetic injected around them, and echogenic block needle trajectory was monitored throughout. Aspiration performed every 5ml. Lung and blood vessels were avoided. All injections were performed without resistance and free of blood and paresthesias. The patient tolerated the procedure well. ? ?Injectate: 10ml exparel + 10ml 0.5% bupivacaine  ? ? ? ?

## 2022-08-27 NOTE — Op Note (Addendum)
SURGERY DATE: 08/27/2022   PRE-OP DIAGNOSIS:  1. Right subacromial impingement 2. Right proximal biceps partial tearing 3. Right rotator cuff tear (subscapularis + supraspinatus)  POST-OP DIAGNOSIS: 1. Right subacromial impingement 2. Right proximal biceps rupture 3. Right rotator cuff tear (subscapularis + supraspinatus)  PROCEDURES:  1. Right arthroscopic rotator cuff repair (full-thickness subscapularis + supraspinatus) 2. Right arthroscopic subacromial decompression 3. Right arthroscopic extensive debridement of shoulder (glenohumeral and subacromial spaces)  SURGEON: Cato Mulligan, MD   ASSISTANT: Anitra Lauth, PA   ANESTHESIA: Gen with Exparel interscalene block   ESTIMATED BLOOD LOSS: 5cc   DRAINS:  none   TOTAL IV FLUIDS: per anesthesia      SPECIMENS: none   IMPLANTS:  - Arthrex 4.22m SwiveLock x 4 - Arthrex 5.568mSwiveLock x1 - Iconix SPEED double loaded with 1.2 and 2.74m574mape x 2     OPERATIVE FINDINGS:  Examination under anesthesia: A careful examination under anesthesia was performed.  Passive range of motion was: FF: 160; ER at side: 60; ER in abduction: 90; IR in abduction: 45.  Anterior load shift: NT.  Posterior load shift: NT.  Sulcus in neutral: NT.  Sulcus in ER: NT.     Intra-operative findings: A thorough arthroscopic examination of the shoulder was performed.  The findings are: 1. Biceps tendon: not visualized within the joint 2. Superior labrum: erythema 3. Posterior labrum and capsule: normal 4. Inferior capsule and inferior recess: normal 5. Glenoid cartilage surface: Normal 6. Supraspinatus attachment: full-thickness tear of the supraspinatus 7. Posterior rotator cuff attachment: normal 8. Humeral head articular cartilage: normal 9. Rotator interval: significant synovitis 10: Subscapularis tendon: full-thickness retracted tear to just lateral to the glenoid 11. Anterior labrum: Mildly degenerative and erythematous 12. IGHL: normal    OPERATIVE REPORT:    Indications for procedure: Jennifer Clinard WhiHowser a [age]o. female with approximately 6 months of right shoulder pain.  She has had difficulty with overhead motion since that time with sensations of weakness. Clinical exam and MRI were suggestive of full thickness rotator cuff tears of both subscapularis and supraspinatus, partial-thickness biceps tearing, and subacromial impingement. She had failed extensive non-operative management. After discussion of risks, benefits, and alternatives to surgery, the patient elected to proceed.    Procedure in detail:   I identified Jennifer Kirby in the pre-operative holding area.  I marked the operative shoulder with my initials. I reviewed the risks and benefits of the proposed surgical intervention, and the patient wished to proceed.  Anesthesia was then performed with an Exparel interscalene block.  The patient was transferred to the operative suite and placed in the beach chair position.     Appropriate IV antibiotics were administered prior to incision. The operative upper extremity was then prepped and draped in standard fashion. A time out was performed confirming the correct extremity, correct patient, and correct procedure.    I then created a standard posterior portal with an 11 blade. The glenohumeral joint was easily entered with a blunt trocar and the arthroscope introduced. The findings of diagnostic arthroscopy are described above. I debrided degenerative tissue including the synovitic tissue about the rotator interval and anterior and superior labrum. I then coagulated the inflamed synovium to obtain hemostasis and reduce the risk of post-operative swelling using an Arthrocare radiofrequency device. The proximal biceps was not visualized intraarticularly. remnant stump of the biceps was debrided down to a stable base on the superior labrum.    The subscapularis tear was identified.  A superior anterolateral portal  was made under needle localization.  A 7 mm cannula was placed.  The, comma tissue indicating the superolateral border of the subscapularis was identified readily.  The tip of the coracoid as well as the conjoined tendon and coracoacromial ligaments were visualized after debriding rotator interval tissue.  Tissue about the subscapularis was released anteriorly, superiorly, and posteriorly to allow for improved mobilization.  The comma tissue was tagged with a FiberWire suture and tension on this allowed for appropriate mobilization of the subscapularis.  The lesser tuberosity footprint was prepared with a combination of electrocautery and curette. 2 suture tapes were passed in a mattress fashion. All 4 strands of suture were then loaded onto a 4.75 mm SwiveLock anchor and placed into the prepared footprint with the arm in a neutral position. However, this did not achieve appropriate fixation and the anchor pulled out of the bone. The anchor was removed from the shoulder in its entirety. Suture tapes were passed through the subscapularis again and loaded onto a 5.29m SwiveLock anchor and impacted into the same hole. There was still some wiggle to this anchor. Therefore, a 4.719mSwiveLock buddy anchor was placed. This construct achieved excellent fixation. The Fiberwire from the 4.7549mwiveLock anchor was then passed through the superior aspect of the subscapularis in a mattress fashion and an arthroscopic knot was tied, further securing the upper border to the lesser tuberosity footprint. The Fiberwire tag stitch was removed.  This construct appropriately reduced the subscapularis tear.  The arm was then internally and externally rotated and the subscapularis was noted to move appropriately with rotation.  The remainder of the suture was then cut.   Next, the arthroscope was then introduced into the subacromial space. The previously made anterolateral portal was utilized. An extensive subacromial bursectomy and  debridement was performed using a combination of the shaver and Arthrocare wand. The entire acromial undersurface was exposed and the CA ligament was subperiosteally elevated to expose the anterior acromial hook. A burr was used to create a flat anterior and lateral aspect of the acromion, converting it from a Type 2 to a Type 1 acromion. Care was made to keep the deltoid fascia intact.   Next, I created an accessory posterolateral portal to assist with visualization and instrumentation.  I debrided the poor quality edges of the supraspinatus tendon.  This was a V-shaped tear of the supraspinatus.  I prepared the footprint using electrocautery to remove soft tissue and then with a burr to expose bleeding bone.     First, a margin convergence stitch was placed at the medial aspect of the tear at the apex of the V. This was tied arthroscopically and cut. This converted the tear to more of a U-shape. I then percutaneously placed 1 Iconix SPEED medial row anchor along the anterior portion of the tear at the articular margin. Another SPEED anchor was placed along the posterior portion of the tear at the articular margin. I then shuttled 6 strands of tape through the rotator cuff just lateral to the musculotendinous junction using a FirstPass suture passer spanning the anterior to posterior extent of the tear. A set of tapes from the anterior anchor was also passed on either side of the tear in a margin convergence fashion. This was then tied arthroscopically bringing the rotator cuff to the anterior anchor. A 3rd margin convergence stitch was placed at the far lateral aspect of the tear. This almost completely reduced the tear.   Next, posterior sutures  were passed through a SwiveLock anchor.  This was placed approximately 2 cm distal to the lateral edge of the footprint in line in the bicipital groove with appropriate tensioning of each suture prior to final fixation.  Similarly, the anterior strands of each  suture were passed through another SwiveLock anchor along the posterior margin of the tear.  This construct allowed for excellent reapproximation of the rotator cuff to its native footprint without undue tension.  Appropriate compression was achieved.  The repair was stable to external and internal rotation.   Fluid was evacuated from the shoulder, and the portals were closed with 3-0 Nylon. Xeroform was applied to the portals. A sterile dressing was applied, followed by a Polar Care sleeve and a SlingShot shoulder immobilizer/sling. The patient was awakened from anesthesia without difficulty and was transferred to the PACU in stable condition.    Additionally, this case had increased complexity compared to standard arthroscopic rotator cuff repair given the involvement of the subscapularis tear. Repair of this tear increased surgical time by 60 minutes and increased complexity due to additional preparation and repair of subscapularis tear, use of additional implants, and creation of an accessory portal, all of which would have otherwise not occurred.  Of note, assistance from a PA was essential to performing the surgery.  PA was present for the entire surgery.  PA assisted with patient positioning, retraction, instrumentation, and wound closure. The surgery would have been more difficult and had longer operative time without PA assistance.   COMPLICATIONS: none   DISPOSITION: plan for discharge home after recovery in PACU     POSTOPERATIVE PLAN: Remain in sling (except hygiene and elbow/wrist/hand RoM exercises as instructed by PT) x 6 weeks and NWB for this time. PT to begin 3-4 days after surgery.  Large rotator cuff repair rehab protocol with subscapularis repair restrictions. ASA '325mg'$  daily x 2 weeks for DVT ppx.

## 2022-08-27 NOTE — Anesthesia Procedure Notes (Signed)
Procedure Name: Intubation Date/Time: 08/27/2022 12:30 PM  Performed by: Doreen Salvage, CRNAPre-anesthesia Checklist: Patient identified, Emergency Drugs available, Suction available and Patient being monitored Patient Re-evaluated:Patient Re-evaluated prior to induction Oxygen Delivery Method: Circle system utilized Preoxygenation: Pre-oxygenation with 100% oxygen Induction Type: IV induction Ventilation: Mask ventilation without difficulty Laryngoscope Size: McGraph and 3 Grade View: Grade I Tube type: Oral Tube size: 6.0 mm Number of attempts: 1 Airway Equipment and Method: Stylet and Oral airway Placement Confirmation: ETT inserted through vocal cords under direct vision, positive ETCO2 and breath sounds checked- equal and bilateral Tube secured with: Tape Dental Injury: Teeth and Oropharynx as per pre-operative assessment

## 2022-08-28 ENCOUNTER — Encounter: Payer: Self-pay | Admitting: Orthopedic Surgery

## 2022-08-28 NOTE — Anesthesia Postprocedure Evaluation (Signed)
Anesthesia Post Note  Patient: Engineer, maintenance  Procedure(s) Performed: Right shoulder arthroscopic versus mini open subscapularis and supraspinatus repair, subacromial decompression, distal clavicle excision, and biceps tenodesis (Right: Shoulder)  Patient location during evaluation: PACU Anesthesia Type: General Level of consciousness: awake and alert Pain management: pain level controlled Vital Signs Assessment: post-procedure vital signs reviewed and stable Respiratory status: spontaneous breathing, nonlabored ventilation, respiratory function stable and patient connected to nasal cannula oxygen Cardiovascular status: blood pressure returned to baseline and stable Postop Assessment: no apparent nausea or vomiting Anesthetic complications: no   No notable events documented.   Last Vitals:  Vitals:   08/27/22 1630 08/27/22 1642  BP: 119/62 114/63  Pulse: 86 85  Resp:  16  Temp:  (!) 36.2 C  SpO2: 92% 92%    Last Pain:  Vitals:   08/27/22 1642  TempSrc:   PainSc: 0-No pain                 Molli Barrows

## 2022-08-31 DIAGNOSIS — M75101 Unspecified rotator cuff tear or rupture of right shoulder, not specified as traumatic: Secondary | ICD-10-CM | POA: Diagnosis not present

## 2022-08-31 DIAGNOSIS — M25611 Stiffness of right shoulder, not elsewhere classified: Secondary | ICD-10-CM | POA: Diagnosis not present

## 2022-08-31 DIAGNOSIS — M7581 Other shoulder lesions, right shoulder: Secondary | ICD-10-CM | POA: Diagnosis not present

## 2022-08-31 DIAGNOSIS — M25511 Pain in right shoulder: Secondary | ICD-10-CM | POA: Diagnosis not present

## 2022-08-31 DIAGNOSIS — Z4789 Encounter for other orthopedic aftercare: Secondary | ICD-10-CM | POA: Diagnosis not present

## 2022-09-02 DIAGNOSIS — M7581 Other shoulder lesions, right shoulder: Secondary | ICD-10-CM | POA: Diagnosis not present

## 2022-09-02 DIAGNOSIS — M75101 Unspecified rotator cuff tear or rupture of right shoulder, not specified as traumatic: Secondary | ICD-10-CM | POA: Diagnosis not present

## 2022-09-02 DIAGNOSIS — M25511 Pain in right shoulder: Secondary | ICD-10-CM | POA: Diagnosis not present

## 2022-09-02 DIAGNOSIS — M25611 Stiffness of right shoulder, not elsewhere classified: Secondary | ICD-10-CM | POA: Diagnosis not present

## 2022-09-02 DIAGNOSIS — Z4789 Encounter for other orthopedic aftercare: Secondary | ICD-10-CM | POA: Diagnosis not present

## 2022-09-08 DIAGNOSIS — M25611 Stiffness of right shoulder, not elsewhere classified: Secondary | ICD-10-CM | POA: Diagnosis not present

## 2022-09-08 DIAGNOSIS — Z4789 Encounter for other orthopedic aftercare: Secondary | ICD-10-CM | POA: Diagnosis not present

## 2022-09-08 DIAGNOSIS — M25511 Pain in right shoulder: Secondary | ICD-10-CM | POA: Diagnosis not present

## 2022-09-08 DIAGNOSIS — M7581 Other shoulder lesions, right shoulder: Secondary | ICD-10-CM | POA: Diagnosis not present

## 2022-09-08 DIAGNOSIS — M75101 Unspecified rotator cuff tear or rupture of right shoulder, not specified as traumatic: Secondary | ICD-10-CM | POA: Diagnosis not present

## 2022-09-10 DIAGNOSIS — Z4789 Encounter for other orthopedic aftercare: Secondary | ICD-10-CM | POA: Diagnosis not present

## 2022-09-10 DIAGNOSIS — M25611 Stiffness of right shoulder, not elsewhere classified: Secondary | ICD-10-CM | POA: Diagnosis not present

## 2022-09-10 DIAGNOSIS — M7581 Other shoulder lesions, right shoulder: Secondary | ICD-10-CM | POA: Diagnosis not present

## 2022-09-10 DIAGNOSIS — M75101 Unspecified rotator cuff tear or rupture of right shoulder, not specified as traumatic: Secondary | ICD-10-CM | POA: Diagnosis not present

## 2022-09-10 DIAGNOSIS — M25511 Pain in right shoulder: Secondary | ICD-10-CM | POA: Diagnosis not present

## 2022-09-14 ENCOUNTER — Encounter: Payer: Self-pay | Admitting: Family Medicine

## 2022-09-14 ENCOUNTER — Other Ambulatory Visit: Payer: Self-pay

## 2022-09-14 MED ORDER — OMEPRAZOLE 20 MG PO CPDR: 20.0000 mg | DELAYED_RELEASE_CAPSULE | Freq: Every day | ORAL | 0 refills | Status: DC

## 2022-09-15 ENCOUNTER — Other Ambulatory Visit: Payer: Self-pay

## 2022-09-15 DIAGNOSIS — K219 Gastro-esophageal reflux disease without esophagitis: Secondary | ICD-10-CM

## 2022-09-15 MED ORDER — PANTOPRAZOLE SODIUM 40 MG PO TBEC: 40.0000 mg | DELAYED_RELEASE_TABLET | Freq: Every day | ORAL | 1 refills | Status: DC

## 2022-09-22 DIAGNOSIS — M75101 Unspecified rotator cuff tear or rupture of right shoulder, not specified as traumatic: Secondary | ICD-10-CM | POA: Diagnosis not present

## 2022-09-22 DIAGNOSIS — Z4789 Encounter for other orthopedic aftercare: Secondary | ICD-10-CM | POA: Diagnosis not present

## 2022-09-22 DIAGNOSIS — M25511 Pain in right shoulder: Secondary | ICD-10-CM | POA: Diagnosis not present

## 2022-09-22 DIAGNOSIS — M7581 Other shoulder lesions, right shoulder: Secondary | ICD-10-CM | POA: Diagnosis not present

## 2022-09-22 DIAGNOSIS — M25611 Stiffness of right shoulder, not elsewhere classified: Secondary | ICD-10-CM | POA: Diagnosis not present

## 2022-09-24 DIAGNOSIS — M7581 Other shoulder lesions, right shoulder: Secondary | ICD-10-CM | POA: Diagnosis not present

## 2022-09-24 DIAGNOSIS — Z4789 Encounter for other orthopedic aftercare: Secondary | ICD-10-CM | POA: Diagnosis not present

## 2022-09-24 DIAGNOSIS — M75101 Unspecified rotator cuff tear or rupture of right shoulder, not specified as traumatic: Secondary | ICD-10-CM | POA: Diagnosis not present

## 2022-09-24 DIAGNOSIS — M25511 Pain in right shoulder: Secondary | ICD-10-CM | POA: Diagnosis not present

## 2022-09-24 DIAGNOSIS — M25611 Stiffness of right shoulder, not elsewhere classified: Secondary | ICD-10-CM | POA: Diagnosis not present

## 2022-09-29 DIAGNOSIS — M7581 Other shoulder lesions, right shoulder: Secondary | ICD-10-CM | POA: Diagnosis not present

## 2022-09-29 DIAGNOSIS — M25511 Pain in right shoulder: Secondary | ICD-10-CM | POA: Diagnosis not present

## 2022-09-29 DIAGNOSIS — M25611 Stiffness of right shoulder, not elsewhere classified: Secondary | ICD-10-CM | POA: Diagnosis not present

## 2022-09-29 DIAGNOSIS — M75101 Unspecified rotator cuff tear or rupture of right shoulder, not specified as traumatic: Secondary | ICD-10-CM | POA: Diagnosis not present

## 2022-10-02 DIAGNOSIS — M7581 Other shoulder lesions, right shoulder: Secondary | ICD-10-CM | POA: Diagnosis not present

## 2022-10-02 DIAGNOSIS — M75101 Unspecified rotator cuff tear or rupture of right shoulder, not specified as traumatic: Secondary | ICD-10-CM | POA: Diagnosis not present

## 2022-10-02 DIAGNOSIS — M25511 Pain in right shoulder: Secondary | ICD-10-CM | POA: Diagnosis not present

## 2022-10-02 DIAGNOSIS — M25611 Stiffness of right shoulder, not elsewhere classified: Secondary | ICD-10-CM | POA: Diagnosis not present

## 2022-10-05 DIAGNOSIS — M25511 Pain in right shoulder: Secondary | ICD-10-CM | POA: Diagnosis not present

## 2022-10-05 DIAGNOSIS — M75101 Unspecified rotator cuff tear or rupture of right shoulder, not specified as traumatic: Secondary | ICD-10-CM | POA: Diagnosis not present

## 2022-10-05 DIAGNOSIS — M7581 Other shoulder lesions, right shoulder: Secondary | ICD-10-CM | POA: Diagnosis not present

## 2022-10-05 DIAGNOSIS — M25611 Stiffness of right shoulder, not elsewhere classified: Secondary | ICD-10-CM | POA: Diagnosis not present

## 2022-10-07 DIAGNOSIS — M75101 Unspecified rotator cuff tear or rupture of right shoulder, not specified as traumatic: Secondary | ICD-10-CM | POA: Diagnosis not present

## 2022-10-07 DIAGNOSIS — M25511 Pain in right shoulder: Secondary | ICD-10-CM | POA: Diagnosis not present

## 2022-10-07 DIAGNOSIS — M7581 Other shoulder lesions, right shoulder: Secondary | ICD-10-CM | POA: Diagnosis not present

## 2022-10-07 DIAGNOSIS — M25611 Stiffness of right shoulder, not elsewhere classified: Secondary | ICD-10-CM | POA: Diagnosis not present

## 2022-10-13 DIAGNOSIS — M25611 Stiffness of right shoulder, not elsewhere classified: Secondary | ICD-10-CM | POA: Diagnosis not present

## 2022-10-13 DIAGNOSIS — M25511 Pain in right shoulder: Secondary | ICD-10-CM | POA: Diagnosis not present

## 2022-10-13 DIAGNOSIS — M7581 Other shoulder lesions, right shoulder: Secondary | ICD-10-CM | POA: Diagnosis not present

## 2022-10-13 DIAGNOSIS — M75101 Unspecified rotator cuff tear or rupture of right shoulder, not specified as traumatic: Secondary | ICD-10-CM | POA: Diagnosis not present

## 2022-10-16 DIAGNOSIS — M75101 Unspecified rotator cuff tear or rupture of right shoulder, not specified as traumatic: Secondary | ICD-10-CM | POA: Diagnosis not present

## 2022-10-16 DIAGNOSIS — M7581 Other shoulder lesions, right shoulder: Secondary | ICD-10-CM | POA: Diagnosis not present

## 2022-10-16 DIAGNOSIS — M25611 Stiffness of right shoulder, not elsewhere classified: Secondary | ICD-10-CM | POA: Diagnosis not present

## 2022-10-16 DIAGNOSIS — M25511 Pain in right shoulder: Secondary | ICD-10-CM | POA: Diagnosis not present

## 2022-10-19 DIAGNOSIS — M7581 Other shoulder lesions, right shoulder: Secondary | ICD-10-CM | POA: Diagnosis not present

## 2022-10-19 DIAGNOSIS — M75101 Unspecified rotator cuff tear or rupture of right shoulder, not specified as traumatic: Secondary | ICD-10-CM | POA: Diagnosis not present

## 2022-10-19 DIAGNOSIS — M25511 Pain in right shoulder: Secondary | ICD-10-CM | POA: Diagnosis not present

## 2022-10-19 DIAGNOSIS — M25611 Stiffness of right shoulder, not elsewhere classified: Secondary | ICD-10-CM | POA: Diagnosis not present

## 2022-10-21 DIAGNOSIS — M7581 Other shoulder lesions, right shoulder: Secondary | ICD-10-CM | POA: Diagnosis not present

## 2022-10-21 DIAGNOSIS — M25611 Stiffness of right shoulder, not elsewhere classified: Secondary | ICD-10-CM | POA: Diagnosis not present

## 2022-10-21 DIAGNOSIS — M75101 Unspecified rotator cuff tear or rupture of right shoulder, not specified as traumatic: Secondary | ICD-10-CM | POA: Diagnosis not present

## 2022-10-21 DIAGNOSIS — M25511 Pain in right shoulder: Secondary | ICD-10-CM | POA: Diagnosis not present

## 2022-10-23 ENCOUNTER — Other Ambulatory Visit: Payer: Self-pay | Admitting: Family Medicine

## 2022-10-23 DIAGNOSIS — E7801 Familial hypercholesterolemia: Secondary | ICD-10-CM

## 2022-10-23 NOTE — Telephone Encounter (Signed)
Requested Prescriptions  Pending Prescriptions Disp Refills   rosuvastatin (CRESTOR) 5 MG tablet [Pharmacy Med Name: ROSUVASTATIN CALCIUM 5 MG TAB] 24 tablet 0    Sig: TAKE 1 TABLET (5 MG TOTAL) BY MOUTH 2 (TWO) TIMES A WEEK ON TUESDAY AND THURSDAY     Cardiovascular:  Antilipid - Statins 2 Failed - 10/23/2022  1:32 AM      Failed - Lipid Panel in normal range within the last 12 months    Cholesterol, Total  Date Value Ref Range Status  01/23/2022 205 (H) 100 - 199 mg/dL Final   LDL Chol Calc (NIH)  Date Value Ref Range Status  01/23/2022 113 (H) 0 - 99 mg/dL Final   HDL  Date Value Ref Range Status  01/23/2022 81 >39 mg/dL Final   Triglycerides  Date Value Ref Range Status  01/23/2022 63 0 - 149 mg/dL Final         Passed - Cr in normal range and within 360 days    Creatinine, Ser  Date Value Ref Range Status  08/25/2022 0.60 0.44 - 1.00 mg/dL Final         Passed - Patient is not pregnant      Passed - Valid encounter within last 12 months    Recent Outpatient Visits           9 months ago Familial hypercholesterolemia   [facility] [place], Deanna C, MD   10 months ago Cellulitis of left lower extremity   [facility] [place], Deanna C, MD   1 year ago Gastroesophageal reflux disease   [facility] [place], Deanna C, MD   2 years ago Patellofemoral pain syndrome of both knees   [facility] [place], Deanna C, MD   2 years ago Lumbar degenerative disc disease   [facility] Fabian, MD

## 2022-11-02 DIAGNOSIS — M25611 Stiffness of right shoulder, not elsewhere classified: Secondary | ICD-10-CM | POA: Diagnosis not present

## 2022-11-02 DIAGNOSIS — M25511 Pain in right shoulder: Secondary | ICD-10-CM | POA: Diagnosis not present

## 2022-11-02 DIAGNOSIS — M7581 Other shoulder lesions, right shoulder: Secondary | ICD-10-CM | POA: Diagnosis not present

## 2022-11-02 DIAGNOSIS — M75101 Unspecified rotator cuff tear or rupture of right shoulder, not specified as traumatic: Secondary | ICD-10-CM | POA: Diagnosis not present

## 2022-11-03 DIAGNOSIS — K080 Exfoliation of teeth due to systemic causes: Secondary | ICD-10-CM | POA: Diagnosis not present

## 2022-11-04 DIAGNOSIS — M25511 Pain in right shoulder: Secondary | ICD-10-CM | POA: Diagnosis not present

## 2022-11-04 DIAGNOSIS — M75101 Unspecified rotator cuff tear or rupture of right shoulder, not specified as traumatic: Secondary | ICD-10-CM | POA: Diagnosis not present

## 2022-11-04 DIAGNOSIS — M7581 Other shoulder lesions, right shoulder: Secondary | ICD-10-CM | POA: Diagnosis not present

## 2022-11-04 DIAGNOSIS — M25611 Stiffness of right shoulder, not elsewhere classified: Secondary | ICD-10-CM | POA: Diagnosis not present

## 2022-11-05 ENCOUNTER — Encounter: Payer: Self-pay | Admitting: Family Medicine

## 2022-11-05 ENCOUNTER — Ambulatory Visit (INDEPENDENT_AMBULATORY_CARE_PROVIDER_SITE_OTHER): Payer: Medicare Other | Admitting: Family Medicine

## 2022-11-05 ENCOUNTER — Other Ambulatory Visit: Payer: Self-pay

## 2022-11-05 DIAGNOSIS — E7801 Familial hypercholesterolemia: Secondary | ICD-10-CM

## 2022-11-05 DIAGNOSIS — J301 Allergic rhinitis due to pollen: Secondary | ICD-10-CM | POA: Diagnosis not present

## 2022-11-05 DIAGNOSIS — K219 Gastro-esophageal reflux disease without esophagitis: Secondary | ICD-10-CM | POA: Diagnosis not present

## 2022-11-05 MED ORDER — PANTOPRAZOLE SODIUM 40 MG PO TBEC: 40.0000 mg | DELAYED_RELEASE_TABLET | Freq: Every day | ORAL | 1 refills | Status: DC

## 2022-11-05 MED ORDER — MONTELUKAST SODIUM 10 MG PO TABS: 10.0000 mg | ORAL_TABLET | ORAL | 3 refills | Status: DC | PRN

## 2022-11-05 MED ORDER — ROSUVASTATIN CALCIUM 5 MG PO TABS: ORAL_TABLET | ORAL | 1 refills | Status: DC

## 2022-11-05 NOTE — Progress Notes (Signed)
Date:  11/05/2022   Name:  Jennifer Kirby   DOB:  Nov 14, 1955   MRN:  [id number]   Chief Complaint: Allergic Rhinitis , Hyperlipidemia, and Gastroesophageal Reflux  Hyperlipidemia This is a chronic problem. The current episode started more than 1 year ago. The problem is controlled. Recent lipid tests were reviewed and are normal. She has no history of chronic renal disease, diabetes, hypothyroidism, liver disease, obesity or nephrotic syndrome. There are no known factors aggravating her hyperlipidemia. Pertinent negatives include no chest pain, focal sensory loss, focal weakness, leg pain, myalgias or shortness of breath. Current antihyperlipidemic treatment includes statins. The current treatment provides moderate improvement of lipids. There are no compliance problems.  There are no known risk factors for coronary artery disease.  Gastroesophageal Reflux She complains of heartburn. She reports no abdominal pain, no belching, no chest pain, no choking, no coughing, no dysphagia, no early satiety, no hoarse voice, no nausea, no sore throat or no wheezing. This is a new problem. The current episode started more than 1 year ago. The symptoms are aggravated by certain foods. Pertinent negatives include no anemia, fatigue, melena, muscle weakness, orthopnea or weight loss. She has tried a PPI for the symptoms. The treatment provided moderate relief.    Lab Results  Component Value Date   NA 139 08/25/2022   K 3.6 08/25/2022   CO2 23 08/25/2022   GLUCOSE 96 08/25/2022   BUN 17 08/25/2022   CREATININE 0.60 08/25/2022   CALCIUM 9.1 08/25/2022   EGFR 100 01/23/2022   GFRNONAA >60 08/25/2022   Lab Results  Component Value Date   CHOL 205 (H) 01/23/2022   HDL 81 01/23/2022   LDLCALC 113 (H) 01/23/2022   TRIG 63 01/23/2022   CHOLHDL 3.5 07/28/2017   No results found for: "TSH" No results found for: "HGBA1C" Lab Results  Component Value Date   WBC 6.4 08/25/2022   HGB 13.8  08/25/2022   HCT 40.3 08/25/2022   MCV 86.1 08/25/2022   PLT 283 08/25/2022   Lab Results  Component Value Date   ALT 13 01/23/2022   AST 23 01/23/2022   ALKPHOS 74 01/23/2022   BILITOT 0.3 01/23/2022   No results found for: "25OHVITD2", "25OHVITD3", "VD25OH"   Review of Systems  Constitutional:  Negative for fatigue and weight loss.  HENT:  Negative for hoarse voice and sore throat.   Respiratory:  Negative for cough, choking, shortness of breath and wheezing.   Cardiovascular:  Negative for chest pain.  Gastrointestinal:  Positive for heartburn. Negative for abdominal pain, blood in stool, constipation, diarrhea, dysphagia, melena and nausea.  Endocrine: Negative for polydipsia and polyuria.  Genitourinary:  Negative for difficulty urinating.  Musculoskeletal:  Negative for myalgias and muscle weakness.  Neurological:  Negative for focal weakness.    Patient Active Problem List   Diagnosis Date Noted   Seasonal allergic rhinitis due to pollen 07/25/2021   Gastroesophageal reflux disease 07/25/2021   Traumatic partial tear of left biceps tendon 05/26/2019   Traumatic complete tear of left rotator cuff 05/26/2019   Synovitis of left shoulder 05/26/2019   Subacromial impingement, left 05/26/2019   Obesity (BMI 30-39.9) 11/25/2018   Contact dermatitis 02/15/2018   Mass of skin of right shoulder 07/27/2016   Brachial neuritis 03/08/2015   Encounter for general adult medical examination without abnormal findings 03/08/2015   Herpes zoster 03/08/2015   H/O hypercholesterolemia 03/08/2015   HLD (hyperlipidemia) 03/08/2015    Allergies  Allergen Reactions  Statins     Makes me hurt    Zetia [Ezetimibe] Other (See Comments)    Gave leg cramps    Past Surgical History:  Procedure Laterality Date   COLONOSCOPY  11/12/2017   FOOT FRACTURE SURGERY Left    PLANTAR FASCIECTOMY Left 2008   ROTATOR CUFF REPAIR Left 05/26/2019   bicep tear, tendon repair and skin graft -  [facility]    SHOULDER ARTHROSCOPY Right 08/27/2022   Procedure: Right shoulder arthroscopic versus mini open subscapularis and supraspinatus repair, subacromial decompression, distal clavicle excision, and biceps tenodesis;  Surgeon: Leim Fabry, MD;  Location: [facility];  Service: Orthopedics;  Laterality: Right;    Social History   Tobacco Use   Smoking status: Never   Smokeless tobacco: Never  Vaping Use   Vaping Use: Never used  Substance Use Topics   Alcohol use: Not Currently   Drug use: No     Medication list has been reviewed and updated.  Current Meds  Medication Sig   acetaminophen (TYLENOL) 500 MG tablet Take 2 tablets (1,000 mg total) by mouth every 8 (eight) hours.   calcium carbonate (TUMS - DOSED IN MG ELEMENTAL CALCIUM) 500 MG chewable tablet Chew 1 tablet by mouth daily as needed.   montelukast (SINGULAIR) 10 MG tablet Take 1 tablet (10 mg total) by mouth at bedtime. (Patient taking differently: Take 10 mg by mouth as needed.)   pantoprazole (PROTONIX) 40 MG tablet Take 1 tablet (40 mg total) by mouth daily.   rosuvastatin (CRESTOR) 5 MG tablet TAKE 1 TABLET (5 MG TOTAL) BY MOUTH 2 (TWO) TIMES A WEEK ON TUESDAY AND THURSDAY   Sennosides-Docusate Sodium (SENNA PLUS) 8.6-50 MG CAPS Take 1 capsule by mouth daily.   [DISCONTINUED] oxyCODONE (ROXICODONE) 5 MG immediate release tablet Take 1-2 tablets (5-10 mg total) by mouth every 4 (four) hours as needed (pain).       11/05/2022    8:15 AM 01/23/2022    8:30 AM 07/25/2021    8:02 AM 06/20/2020    8:15 AM  GAD 7 : Generalized Anxiety Score  Nervous, Anxious, on Edge 0 0 0 0  Control/stop worrying 0 0 0 0  Worry too much - different things 0 0 0 0  Trouble relaxing 0 0 0 0  Restless 0 0 0 0  Easily annoyed or irritable 0 0 0 0  Afraid - awful might happen 0 0 0 0  Total GAD 7 Score 0 0 0 0  Anxiety Difficulty Not difficult at all Not difficult at all         11/05/2022    8:14 AM 07/15/2022    1:18 PM 01/23/2022     8:28 AM  Depression screen PHQ 2/9  Decreased Interest 0 0 0  Down, Depressed, Hopeless 0 0 0  PHQ - 2 Score 0 0 0  Altered sleeping 0 3 0  Tired, decreased energy 0 0 0  Change in appetite 0 1 0  Feeling bad or failure about yourself  0 0 0  Trouble concentrating 0 0 0  Moving slowly or fidgety/restless 0 0 0  Suicidal thoughts 0 0 0  PHQ-9 Score 0 4 0  Difficult doing work/chores Not difficult at all Not difficult at all Not difficult at all    BP Readings from Last 3 Encounters:  11/05/22 120/70  08/27/22 114/63  07/15/22 104/68    Physical Exam Vitals and nursing note reviewed. Exam conducted with a chaperone present.  Constitutional:  General: She is not in acute distress.    Appearance: She is not diaphoretic.  HENT:     Head: Normocephalic and atraumatic.     Right Ear: External ear normal.     Left Ear: External ear normal.     Nose: Nose normal.  Eyes:     General:        Right eye: No discharge.        Left eye: No discharge.     Conjunctiva/sclera: Conjunctivae normal.     Pupils: Pupils are equal, round, and reactive to light.  Cardiovascular:     Rate and Rhythm: Normal rate and regular rhythm.     Heart sounds: Normal heart sounds, S1 normal and S2 normal. No murmur heard.    No systolic murmur is present.     No diastolic murmur is present.     No friction rub. No gallop. No S3 or S4 sounds.  Pulmonary:     Effort: Pulmonary effort is normal.     Breath sounds: Normal breath sounds. No wheezing, rhonchi or rales.  Chest:     Chest wall: No tenderness.  Abdominal:     General: Bowel sounds are normal.     Palpations: Abdomen is soft. There is no hepatomegaly, splenomegaly or mass.     Tenderness: There is no abdominal tenderness. There is no guarding or rebound.  Musculoskeletal:        General: Normal range of motion.     Cervical back: Neck supple.  Skin:    General: Skin is warm and dry.  Neurological:     General: No focal deficit  present.     Mental Status: She is alert.     Deep Tendon Reflexes: Reflexes are normal and symmetric.     Wt Readings from Last 3 Encounters:  11/05/22 149 lb (67.6 kg)  08/27/22 144 lb (65.3 kg)  08/25/22 144 lb (65.3 kg)    BP 120/70   Pulse 77   Ht '4\' 11"'$  (1.499 m)   Wt 149 lb (67.6 kg)   SpO2 97%   BMI 30.09 kg/m   Assessment and Plan: 1. Seasonal allergic rhinitis due to pollen Chronic.  Controlled.  Stable.  Continue Singulair 10 mg once a day. - montelukast (SINGULAIR) 10 MG tablet; Take 1 tablet (10 mg total) by mouth as needed.  Dispense: 90 tablet; Refill: 3  2. Gastroesophageal reflux disease Chronic.  Absolutely necessary.  Patient gets no relief from over-the-counter dosings of PPIs.  We will continue pantoprazole 40 mg once a day and will recheck in 6 months. - pantoprazole (PROTONIX) 40 MG tablet; Take 1 tablet (40 mg total) by mouth daily.  Dispense: 90 tablet; Refill: 1  3. Familial hypercholesterolemia Chronic.  Controlled.  Stable.  Continue rosuvastatin 5 mg twice a week Tuesday Thursday we will check lipid panel for current level of control and CMP for hepatic surveillance.  Will recheck patient in 6 months - rosuvastatin (CRESTOR) 5 MG tablet; TAKE 1 TABLET (5 MG TOTAL) BY MOUTH 2 (TWO) TIMES A WEEK ON TUESDAY AND THURSDAY  Dispense: 24 tablet; Refill: 1 - Lipid Panel With LDL/HDL Ratio - Comprehensive Metabolic Panel (CMET)     Otilio Miu, MD

## 2022-11-06 ENCOUNTER — Other Ambulatory Visit: Payer: Self-pay

## 2022-11-06 DIAGNOSIS — E7801 Familial hypercholesterolemia: Secondary | ICD-10-CM

## 2022-11-06 LAB — COMPREHENSIVE METABOLIC PANEL
ALT: 16 IU/L (ref 0–32)
AST: 23 IU/L (ref 0–40)
Albumin/Globulin Ratio: 1.9 (ref 1.2–2.2)
Albumin: 4.6 g/dL (ref 3.9–4.9)
Alkaline Phosphatase: 68 IU/L (ref 44–121)
BUN/Creatinine Ratio: 19 (ref 12–28)
BUN: 11 mg/dL (ref 8–27)
Bilirubin Total: 0.4 mg/dL (ref 0.0–1.2)
CO2: 23 mmol/L (ref 20–29)
Calcium: 9.3 mg/dL (ref 8.7–10.3)
Chloride: 103 mmol/L (ref 96–106)
Creatinine, Ser: 0.57 mg/dL (ref 0.57–1.00)
Globulin, Total: 2.4 g/dL (ref 1.5–4.5)
Glucose: 95 mg/dL (ref 70–99)
Potassium: 4.4 mmol/L (ref 3.5–5.2)
Sodium: 141 mmol/L (ref 134–144)
Total Protein: 7 g/dL (ref 6.0–8.5)
eGFR: 99 mL/min/{1.73_m2} (ref >59)

## 2022-11-06 LAB — LIPID PANEL WITH LDL/HDL RATIO
Cholesterol, Total: 251 mg/dL — ABNORMAL HIGH (ref 100–199)
HDL: 87 mg/dL (ref >39)
LDL Chol Calc (NIH): 149 mg/dL — ABNORMAL HIGH (ref 0–99)
LDL/HDL Ratio: 1.7 ratio (ref 0.0–3.2)
Triglycerides: 90 mg/dL (ref 0–149)
VLDL Cholesterol Cal: 15 mg/dL (ref 5–40)

## 2022-11-06 MED ORDER — ROSUVASTATIN CALCIUM 5 MG PO TABS: ORAL_TABLET | ORAL | 1 refills | Status: DC

## 2022-11-09 DIAGNOSIS — M25611 Stiffness of right shoulder, not elsewhere classified: Secondary | ICD-10-CM | POA: Diagnosis not present

## 2022-11-09 DIAGNOSIS — M7581 Other shoulder lesions, right shoulder: Secondary | ICD-10-CM | POA: Diagnosis not present

## 2022-11-09 DIAGNOSIS — M25511 Pain in right shoulder: Secondary | ICD-10-CM | POA: Diagnosis not present

## 2022-11-09 DIAGNOSIS — M75101 Unspecified rotator cuff tear or rupture of right shoulder, not specified as traumatic: Secondary | ICD-10-CM | POA: Diagnosis not present

## 2022-11-11 DIAGNOSIS — M25511 Pain in right shoulder: Secondary | ICD-10-CM | POA: Diagnosis not present

## 2022-11-11 DIAGNOSIS — M75101 Unspecified rotator cuff tear or rupture of right shoulder, not specified as traumatic: Secondary | ICD-10-CM | POA: Diagnosis not present

## 2022-11-11 DIAGNOSIS — M25611 Stiffness of right shoulder, not elsewhere classified: Secondary | ICD-10-CM | POA: Diagnosis not present

## 2022-11-11 DIAGNOSIS — M7581 Other shoulder lesions, right shoulder: Secondary | ICD-10-CM | POA: Diagnosis not present

## 2022-11-16 DIAGNOSIS — M75101 Unspecified rotator cuff tear or rupture of right shoulder, not specified as traumatic: Secondary | ICD-10-CM | POA: Diagnosis not present

## 2022-11-16 DIAGNOSIS — M25611 Stiffness of right shoulder, not elsewhere classified: Secondary | ICD-10-CM | POA: Diagnosis not present

## 2022-11-16 DIAGNOSIS — M7581 Other shoulder lesions, right shoulder: Secondary | ICD-10-CM | POA: Diagnosis not present

## 2022-11-16 DIAGNOSIS — M25511 Pain in right shoulder: Secondary | ICD-10-CM | POA: Diagnosis not present

## 2022-11-18 DIAGNOSIS — M25611 Stiffness of right shoulder, not elsewhere classified: Secondary | ICD-10-CM | POA: Diagnosis not present

## 2022-11-18 DIAGNOSIS — M75101 Unspecified rotator cuff tear or rupture of right shoulder, not specified as traumatic: Secondary | ICD-10-CM | POA: Diagnosis not present

## 2022-11-18 DIAGNOSIS — M25511 Pain in right shoulder: Secondary | ICD-10-CM | POA: Diagnosis not present

## 2022-11-18 DIAGNOSIS — M7581 Other shoulder lesions, right shoulder: Secondary | ICD-10-CM | POA: Diagnosis not present

## 2022-11-23 DIAGNOSIS — M25611 Stiffness of right shoulder, not elsewhere classified: Secondary | ICD-10-CM | POA: Diagnosis not present

## 2022-11-23 DIAGNOSIS — M25511 Pain in right shoulder: Secondary | ICD-10-CM | POA: Diagnosis not present

## 2022-11-23 DIAGNOSIS — M75101 Unspecified rotator cuff tear or rupture of right shoulder, not specified as traumatic: Secondary | ICD-10-CM | POA: Diagnosis not present

## 2022-11-23 DIAGNOSIS — M7581 Other shoulder lesions, right shoulder: Secondary | ICD-10-CM | POA: Diagnosis not present

## 2022-11-25 DIAGNOSIS — M25611 Stiffness of right shoulder, not elsewhere classified: Secondary | ICD-10-CM | POA: Diagnosis not present

## 2022-11-25 DIAGNOSIS — M75101 Unspecified rotator cuff tear or rupture of right shoulder, not specified as traumatic: Secondary | ICD-10-CM | POA: Diagnosis not present

## 2022-11-25 DIAGNOSIS — M7581 Other shoulder lesions, right shoulder: Secondary | ICD-10-CM | POA: Diagnosis not present

## 2022-11-25 DIAGNOSIS — M25511 Pain in right shoulder: Secondary | ICD-10-CM | POA: Diagnosis not present

## 2022-11-27 ENCOUNTER — Encounter: Payer: Self-pay | Admitting: Family Medicine

## 2022-11-27 ENCOUNTER — Ambulatory Visit (INDEPENDENT_AMBULATORY_CARE_PROVIDER_SITE_OTHER): Payer: Medicare Other | Admitting: Family Medicine

## 2022-11-27 VITALS — BP 120/80 | HR 70 | Ht 59.0 in | Wt 151.0 lb

## 2022-11-27 DIAGNOSIS — R29898 Other symptoms and signs involving the musculoskeletal system: Secondary | ICD-10-CM | POA: Diagnosis not present

## 2022-11-27 NOTE — Progress Notes (Signed)
Date:  11/27/2022   Name:  Jennifer Kirby   DOB:  October 09, 1955   MRN:  [id number]   Chief Complaint: Hand Pain  Hand Pain  Incident onset: 3 years ago. The incident occurred in the yard. Injury mechanism: hedge. The pain is present in the left fingers. Quality: pruritic. The pain does not radiate. The pain is mild. Pertinent negatives include no chest pain.    Lab Results  Component Value Date   NA 141 11/05/2022   K 4.4 11/05/2022   CO2 23 11/05/2022   GLUCOSE 95 11/05/2022   BUN 11 11/05/2022   CREATININE 0.57 11/05/2022   CALCIUM 9.3 11/05/2022   EGFR 99 11/05/2022   GFRNONAA >60 08/25/2022   Lab Results  Component Value Date   CHOL 251 (H) 11/05/2022   HDL 87 11/05/2022   LDLCALC 149 (H) 11/05/2022   TRIG 90 11/05/2022   CHOLHDL 3.5 07/28/2017   No results found for: "TSH" No results found for: "HGBA1C" Lab Results  Component Value Date   WBC 6.4 08/25/2022   HGB 13.8 08/25/2022   HCT 40.3 08/25/2022   MCV 86.1 08/25/2022   PLT 283 08/25/2022   Lab Results  Component Value Date   ALT 16 11/05/2022   AST 23 11/05/2022   ALKPHOS 68 11/05/2022   BILITOT 0.4 11/05/2022   No results found for: "25OHVITD2", "25OHVITD3", "VD25OH"   Review of Systems  Constitutional:  Negative for unexpected weight change.  HENT:  Negative for trouble swallowing.   Eyes:  Negative for visual disturbance.  Respiratory:  Negative for shortness of breath and wheezing.   Cardiovascular:  Negative for chest pain, palpitations and leg swelling.  Gastrointestinal:  Negative for abdominal pain.  Endocrine: Negative for polydipsia.    Patient Active Problem List   Diagnosis Date Noted   Seasonal allergic rhinitis due to pollen 07/25/2021   Gastroesophageal reflux disease 07/25/2021   Traumatic partial tear of left biceps tendon 05/26/2019   Traumatic complete tear of left rotator cuff 05/26/2019   Synovitis of left shoulder 05/26/2019   Subacromial impingement, left  05/26/2019   Obesity (BMI 30-39.9) 11/25/2018   Contact dermatitis 02/15/2018   Mass of skin of right shoulder 07/27/2016   Brachial neuritis 03/08/2015   Encounter for general adult medical examination without abnormal findings 03/08/2015   Herpes zoster 03/08/2015   H/O hypercholesterolemia 03/08/2015   HLD (hyperlipidemia) 03/08/2015    Allergies  Allergen Reactions   Statins     Makes me hurt    Zetia [Ezetimibe] Other (See Comments)    Gave leg cramps    Past Surgical History:  Procedure Laterality Date   COLONOSCOPY  11/12/2017   FOOT FRACTURE SURGERY Left    PLANTAR FASCIECTOMY Left 2008   ROTATOR CUFF REPAIR Left 05/26/2019   bicep tear, tendon repair and skin graft - [facility]    SHOULDER ARTHROSCOPY Right 08/27/2022   Procedure: Right shoulder arthroscopic versus mini open subscapularis and supraspinatus repair, subacromial decompression, distal clavicle excision, and biceps tenodesis;  Surgeon: Leim Fabry, MD;  Location: [facility];  Service: Orthopedics;  Laterality: Right;    Social History   Tobacco Use   Smoking status: Never   Smokeless tobacco: Never  Vaping Use   Vaping Use: Never used  Substance Use Topics   Alcohol use: Not Currently   Drug use: No     Medication list has been reviewed and updated.  Current Meds  Medication Sig   acetaminophen (TYLENOL) 500  MG tablet Take 2 tablets (1,000 mg total) by mouth every 8 (eight) hours.   calcium carbonate (TUMS - DOSED IN MG ELEMENTAL CALCIUM) 500 MG chewable tablet Chew 1 tablet by mouth daily as needed.   montelukast (SINGULAIR) 10 MG tablet Take 1 tablet (10 mg total) by mouth as needed.   pantoprazole (PROTONIX) 40 MG tablet Take 1 tablet (40 mg total) by mouth daily.   rosuvastatin (CRESTOR) 5 MG tablet TAKE 1 TABLET (5 MG TOTAL) BY MOUTH 3 times A WEEK ON TUESDAY AND THURSDAY and Saturday   Sennosides-Docusate Sodium (SENNA PLUS) 8.6-50 MG CAPS Take 1 capsule by mouth daily.       11/27/2022     9:13 AM 11/05/2022    8:15 AM 01/23/2022    8:30 AM 07/25/2021    8:02 AM  GAD 7 : Generalized Anxiety Score  Nervous, Anxious, on Edge 0 0 0 0  Control/stop worrying 0 0 0 0  Worry too much - different things 0 0 0 0  Trouble relaxing 0 0 0 0  Restless 0 0 0 0  Easily annoyed or irritable 0 0 0 0  Afraid - awful might happen 0 0 0 0  Total GAD 7 Score 0 0 0 0  Anxiety Difficulty Not difficult at all Not difficult at all Not difficult at all        11/27/2022    9:12 AM 11/05/2022    8:14 AM 07/15/2022    1:18 PM  Depression screen PHQ 2/9  Decreased Interest 0 0 0  Down, Depressed, Hopeless 0 0 0  PHQ - 2 Score 0 0 0  Altered sleeping 0 0 3  Tired, decreased energy 0 0 0  Change in appetite 0 0 1  Feeling bad or failure about yourself  0 0 0  Trouble concentrating 0 0 0  Moving slowly or fidgety/restless 0 0 0  Suicidal thoughts 0 0 0  PHQ-9 Score 0 0 4  Difficult doing work/chores Not difficult at all Not difficult at all Not difficult at all    BP Readings from Last 3 Encounters:  11/27/22 120/80  11/05/22 120/70  08/27/22 114/63    Physical Exam Vitals and nursing note reviewed. Exam conducted with a chaperone present.  Constitutional:      General: She is not in acute distress.    Appearance: She is not diaphoretic.  HENT:     Head: Normocephalic and atraumatic.     Right Ear: External ear normal.     Left Ear: External ear normal.     Nose: Nose normal.  Eyes:     General:        Right eye: No discharge.        Left eye: No discharge.     Conjunctiva/sclera: Conjunctivae normal.     Pupils: Pupils are equal, round, and reactive to light.  Neck:     Thyroid: No thyromegaly.     Vascular: No JVD.  Cardiovascular:     Rate and Rhythm: Normal rate and regular rhythm.     Heart sounds: Normal heart sounds. No murmur heard.    No friction rub. No gallop.  Pulmonary:     Effort: Pulmonary effort is normal.     Breath sounds: Normal breath sounds.   Abdominal:     General: Bowel sounds are normal.     Palpations: Abdomen is soft. There is no mass.     Tenderness: There is no abdominal tenderness. There  is no guarding.  Musculoskeletal:        General: Normal range of motion.     Left hand: Swelling present. No tenderness. Normal range of motion. Normal strength. Normal sensation.     Cervical back: Normal range of motion and neck supple.     Comments: ? Thickening 4th flexor  Lymphadenopathy:     Cervical: No cervical adenopathy.  Skin:    General: Skin is warm and dry.  Neurological:     Mental Status: She is alert.     Deep Tendon Reflexes: Reflexes are normal and symmetric.     Wt Readings from Last 3 Encounters:  11/27/22 151 lb (68.5 kg)  11/05/22 149 lb (67.6 kg)  08/27/22 144 lb (65.3 kg)    BP 120/80   Pulse 70   Ht '4\' 11"'$  (1.499 m)   Wt 151 lb (68.5 kg)   SpO2 97%   BMI 30.50 kg/m   Assessment and Plan:  1. Hand disorder New onset.  Previous history of trauma primarily involving the second, middle, ring fingers of left hand.  This was in 2020 and was initially evaluated by Dr. Amedeo Plenty of [facility]..  Patient has done well but has noticed in the palm of her left hand a raised area that itches and does not really hurt.  This may be an early Dupuytren's or a nodular area that may be going to progress to a trigger finger which has not been manifested at this time or granulation tissue that is evolving from the previous injury.  We have recommended that she see a hand specialist for evaluation and since this was previously evaluated by [facility] that we will proceed in this direction.   - Ambulatory referral to Orthopedic Surgery    Otilio Miu, MD

## 2022-11-27 NOTE — Patient Instructions (Signed)
Dupuytren's Contracture Dupuytren's contracture is a condition in which tissue under the skin of the palm becomes thick. This causes one or more of the fingers to curl inward (contract) toward the palm. After a while, the fingers may not be able to straighten out. This condition affects some or all of the fingers and the palm of the hand. This condition may affect one or both hands. Dupuytren's contracture is a long-term (chronic) condition that develops (progresses) slowly over time. There is no cure, but symptoms can be managed and progression can be slowed with treatment. This condition is usually not dangerous or painful, but it can interfere with everyday tasks. What are the causes?  This condition is caused by tissue (fascia) in the palm that gets thicker and tighter. When the fascia thickens, it pulls on the cords of tissue (tendons) that control finger movement. This causes the fingers to contract. The cause of fascia thickening is not known. However, the condition is often passed along from parent to child (inherited). What increases the risk? The following factors may make you more likely to develop this condition: Being 59 years of age or older. Being female. Having a family history of this condition. Using tobacco products, including cigarettes, chewing tobacco, and e-cigarettes. Drinking alcohol excessively. Having diabetes. Having a seizure disorder. What are the signs or symptoms? Early symptoms of this condition may include: Thick, puckered skin on the hand. One or more lumps (nodules) on the palm. Nodules may be tender when they first appear, but they are generally painless. Later symptoms of this condition may include: Thick cords of tissue in the palm. Fingers curled up toward the palm. Inability to straighten the fingers into their normal position. Though this condition is usually painless, you may have discomfort when holding or grabbing objects. How is this  diagnosed? This condition is diagnosed with a physical exam, which may include: Looking at your hands and feeling your palms. This is to check for thickened fascia and nodules. Measuring finger motion. Doing the [place] tabletop test. You may be asked to try to put your hand on a surface, with your palm down and your fingers straight out. How is this treated? There is no cure for this condition, but treatment can relieve discomfort and make symptoms more manageable. Treatment options may include: Physical therapy. This can strengthen your hand and increase flexibility. Occupational therapy. This can help you with everyday tasks that may be more difficult because of your condition. Shots (injections). Substances may be injected into your hand, such as: Medicines that help to decrease swelling (corticosteroids). Proteins (collagenase) to weaken thick tissue. After a collagenase injection, your health care provider may stretch your fingers. Needle aponeurotomy. A needle is pushed through the skin and into the fascia. Moving the needle against the fascia can weaken or break up the thick tissue. Surgery. This may be needed if your condition causes discomfort or interferes with everyday activities. Physical therapy is usually needed after surgery. No treatment is guaranteed to cure this condition. Recurrence of symptoms is common. Follow these instructions at home: Hand care Take these actions to help protect your hand from possible injury: Use tools that have padded grips. Wear protective gloves while you work with your hands. Avoid repetitive hand movements. General instructions Take over-the-counter and prescription medicines only as told by your health care provider. Manage any other conditions that you have, such as diabetes. If physical therapy was prescribed, do exercises as told by your health care provider. Do not  use any products that contain nicotine or tobacco, such as cigarettes,  e-cigarettes, and chewing tobacco. If you need help quitting, ask your health care provider. If you drink alcohol: Limit how much you have to: 0-1 drink a day for women who are not pregnant. 0-2 drinks a day for men. Be aware of how much alcohol is in your drink. In the [place] one drink equals one 12 oz bottle of beer (355 mL), one 5 oz glass of wine (148 mL), or one 1 oz glass of hard liquor (44 mL). Keep all follow-up visits as told by your health care provider. This is important. Contact a health care provider if: You develop new symptoms, or your symptoms get worse. You have pain that gets worse or does not get better with medicine. You have difficulty or discomfort with everyday tasks. You develop numbness or tingling. Get help right away if: You have severe pain. Your fingers change color or become unusually cold. Summary Dupuytren's contracture is a condition in which tissue under the skin of the palm becomes thick. This condition is caused by tissue (fascia) that thickens. When it thickens, it pulls on the cords of tissue (tendons) that control finger movement and makes the fingers contract. You are more likely to develop this condition if you are a man, are over 38 years of age, have a family history of the condition, and drink a lot of alcohol. This condition can be treated with physical and occupational therapy, injections, and surgery. Follow instructions about how to care for your hand. Get help right away if you have severe pain or your fingers change color or become cold. This information is not intended to replace advice given to you by your health care provider. Make sure you discuss any questions you have with your health care provider. Document Revised: 12/24/2020 Document Reviewed: 12/24/2020 Elsevier Patient Education  [place].

## 2022-11-30 DIAGNOSIS — M25511 Pain in right shoulder: Secondary | ICD-10-CM | POA: Diagnosis not present

## 2022-11-30 DIAGNOSIS — M25611 Stiffness of right shoulder, not elsewhere classified: Secondary | ICD-10-CM | POA: Diagnosis not present

## 2022-11-30 DIAGNOSIS — M75121 Complete rotator cuff tear or rupture of right shoulder, not specified as traumatic: Secondary | ICD-10-CM | POA: Diagnosis not present

## 2022-11-30 DIAGNOSIS — M75101 Unspecified rotator cuff tear or rupture of right shoulder, not specified as traumatic: Secondary | ICD-10-CM | POA: Diagnosis not present

## 2022-11-30 DIAGNOSIS — M7581 Other shoulder lesions, right shoulder: Secondary | ICD-10-CM | POA: Diagnosis not present

## 2022-12-01 ENCOUNTER — Ambulatory Visit (INDEPENDENT_AMBULATORY_CARE_PROVIDER_SITE_OTHER): Payer: Medicare Other | Admitting: Dermatology

## 2022-12-01 ENCOUNTER — Encounter: Payer: Self-pay | Admitting: Dermatology

## 2022-12-01 VITALS — BP 117/85 | HR 71

## 2022-12-01 DIAGNOSIS — L84 Corns and callosities: Secondary | ICD-10-CM

## 2022-12-01 DIAGNOSIS — L719 Rosacea, unspecified: Secondary | ICD-10-CM

## 2022-12-01 DIAGNOSIS — Z1283 Encounter for screening for malignant neoplasm of skin: Secondary | ICD-10-CM | POA: Diagnosis not present

## 2022-12-01 DIAGNOSIS — Z872 Personal history of diseases of the skin and subcutaneous tissue: Secondary | ICD-10-CM

## 2022-12-01 DIAGNOSIS — L309 Dermatitis, unspecified: Secondary | ICD-10-CM | POA: Diagnosis not present

## 2022-12-01 DIAGNOSIS — L570 Actinic keratosis: Secondary | ICD-10-CM

## 2022-12-01 DIAGNOSIS — D235 Other benign neoplasm of skin of trunk: Secondary | ICD-10-CM | POA: Diagnosis not present

## 2022-12-01 DIAGNOSIS — L720 Epidermal cyst: Secondary | ICD-10-CM | POA: Diagnosis not present

## 2022-12-01 DIAGNOSIS — D239 Other benign neoplasm of skin, unspecified: Secondary | ICD-10-CM

## 2022-12-01 DIAGNOSIS — L820 Inflamed seborrheic keratosis: Secondary | ICD-10-CM

## 2022-12-01 MED ORDER — METRONIDAZOLE 0.75 % EX CREA: TOPICAL_CREAM | CUTANEOUS | 5 refills | Status: DC

## 2022-12-01 MED ORDER — TRIAMCINOLONE ACETONIDE 0.1 % EX CREA: 1.0000 | TOPICAL_CREAM | CUTANEOUS | 1 refills | Status: DC

## 2022-12-01 NOTE — Progress Notes (Signed)
Follow-Up Visit   Subjective  Jennifer Kirby is a 67 y.o. female who presents for the following: Annual Exam (1 year tbse, hx of rosacea has metronidazole 0.75 % cream not using, hx of aks right lateral eyebrow and left temple, hx of hand dermatitis using tmc 0.1 cream as needed. Patient reports some spots at back, under left lower eyelid, dry patch at left eye and spot at left jaw she would like checked ).  Spot at L jaw was frozen in the past and has come back.  The patient presents for Total-Body Skin Exam (TBSE) for skin cancer screening and mole check.  The patient has spots, moles and lesions to be evaluated, some may be new or changing and the patient has concerns that these could be cancer.   The following portions of the chart were reviewed this encounter and updated as appropriate:      Review of Systems: No other skin or systemic complaints except as noted in HPI or Assessment and Plan.   Objective  Well appearing patient in no apparent distress; mood and affect are within normal limits.  A full examination was performed including scalp, head, eyes, ears, nose, lips, neck, chest, axillae, abdomen, back, buttocks, bilateral upper extremities, bilateral lower extremities, hands, feet, fingers, toes, fingernails, and toenails. All findings within normal limits unless otherwise noted below.  hands, finger Xerosis on finger tips, pt reports getting cracks and fissures  face Mild erythema and telangiectasia on cheeks and inflammatory papule on left infraocular   left temple x 1, left jaw x 1 (2) Erythematous stuck-on, waxy papule or plaque  Right Temple x 3 (3) Erythematous thin papules/macules with gritty scale.   left lower sternum 4 mm subcutaneous nodule.   right medial great toe Hyperkeratotic plaque   Assessment & Plan  Hand dermatitis hands, finger  Chronic and persistent condition with duration or expected duration over one year. Condition is  symptomatic/ bothersome to patient. Not currently at goal.   Hand Dermatitis is a chronic type of eczema that can come and go on the hands and fingers.  While there is no cure, the rash and symptoms can be managed with topical prescription medications, and for more severe cases, with systemic medications.  Recommend mild soap and routine use of moisturizing cream after handwashing.  Minimize soap/water exposure when possible.      Can restart TMC 0.1% cr qd/bid to fingertips until clear, avoid f/g/a Recommend mild soap and moisturizer with handwashing  Recommend starting moisturizer with exfoliant (Urea, Salicylic acid, or Lactic acid) one to two times daily to help smooth rough and bumpy skin.  OTC options include Cetaphil Rough and Bumpy lotion (Urea), Eucerin Roughness Relief lotion or spot treatment cream (Urea), CeraVe SA lotion/cream for Rough and Bumpy skin (Sal Acid), Gold Bond Rough and Bumpy cream (Sal Acid), and AmLactin 12% lotion/cream (Lactic Acid).  If applying in morning, also apply sunscreen to sun-exposed areas, since these exfoliating moisturizers can increase sensitivity to sun.   Topical steroids (such as triamcinolone, fluocinolone, fluocinonide, mometasone, clobetasol, halobetasol, betamethasone, hydrocortisone) can cause thinning and lightening of the skin if they are used for too long in the same area. Your physician has selected the right strength medicine for your problem and area affected on the body. Please use your medication only as directed by your physician to prevent side effects.     triamcinolone cream (KENALOG) 0.1 % - hands, finger Apply 1 Application topically as directed. Qd to bid  to fingertips until clear, then prn flares, avoid face, groin, axilla  Rosacea face  Chronic and persistent condition with duration or expected duration over one year. Condition is symptomatic/ bothersome to patient (persistent redness). Not currently at goal.   Rosacea is a  chronic progressive skin condition usually affecting the face of adults, causing redness and/or acne bumps. It is treatable but not curable. It sometimes affects the eyes (ocular rosacea) as well. It may respond to topical and/or systemic medication and can flare with stress, sun exposure, alcohol, exercise and some foods.  Daily application of broad spectrum spf 30+ sunscreen to face is recommended to reduce flares.   Continue Metronidazole 0.75% cream qhs, use more regularly to prevent flares Continue daily sunscreen to face   metroNIDAZOLE (METROCREAM) 0.75 % cream - face Apply to mid face 1-2 times a day for rosacea.  Inflamed seborrheic keratosis (2) left temple x 1, left jaw x 1  Symptomatic, irritating, patient would like treated.  Recurrent isk at left jaw  Destruction of lesion - left temple x 1, left jaw x 1  Destruction method: cryotherapy   Informed consent: discussed and consent obtained   Lesion destroyed using liquid nitrogen: Yes   Region frozen until ice ball extended beyond lesion: Yes   Outcome: patient tolerated procedure well with no complications   Post-procedure details: wound care instructions given   Additional details:  Prior to procedure, discussed risks of blister formation, small wound, skin dyspigmentation, or rare scar following cryotherapy. Recommend Vaseline ointment to treated areas while healing.   Actinic keratosis (3) Right Temple x 3  Actinic keratoses are precancerous spots that appear secondary to cumulative UV radiation exposure/sun exposure over time. They are chronic with expected duration over 1 year. A portion of actinic keratoses will progress to squamous cell carcinoma of the skin. It is not possible to reliably predict which spots will progress to skin cancer and so treatment is recommended to prevent development of skin cancer.  Recommend daily broad spectrum sunscreen SPF 30+ to sun-exposed areas, reapply every 2 hours as needed.   Recommend staying in the shade or wearing long sleeves, sun glasses (UVA+UVB protection) and wide brim hats (4-inch brim around the entire circumference of the hat). Call for new or changing lesions.  Destruction of lesion - Right Temple x 3  Destruction method: cryotherapy   Informed consent: discussed and consent obtained   Lesion destroyed using liquid nitrogen: Yes   Region frozen until ice ball extended beyond lesion: Yes   Outcome: patient tolerated procedure well with no complications   Post-procedure details: wound care instructions given   Additional details:  Prior to procedure, discussed risks of blister formation, small wound, skin dyspigmentation, or rare scar following cryotherapy. Recommend Vaseline ointment to treated areas while healing.   Epidermal inclusion cyst left lower sternum  Benign-appearing. Exam most consistent with an epidermal inclusion cyst. Discussed that a cyst is a benign growth that can grow over time and sometimes get irritated or inflamed. Recommend observation if it is not bothersome. Discussed option of surgical excision to remove it if it is growing, symptomatic, or other changes noted. Please call for new or changing lesions so they can be evaluated.    Dilated pore of Winer left mid back x 1  Vs Residual Isk, with pruritus  Will treat with cryotherapy   May use the TMC 0.1% cr qd/bid prn itch, avoid f/g/a  Destruction of lesion - left mid back x 1  Destruction  method: cryotherapy   Informed consent: discussed and consent obtained   Lesion destroyed using liquid nitrogen: Yes   Region frozen until ice ball extended beyond lesion: Yes   Outcome: patient tolerated procedure well with no complications   Post-procedure details: wound care instructions given   Additional details:  Prior to procedure, discussed risks of blister formation, small wound, skin dyspigmentation, or rare scar following cryotherapy. Recommend Vaseline ointment to  treated areas while healing.   Callus right medial great toe  Benign, observe.    Recommend using Curad Mediplast pads. Cut to fit wart or callus. Cover with Elastoplast waterproof tape or any waterproof band-aid. Change every 3 to 4 days, or sooner if necessary.  Treatment may require several months of regular use before results are seen.  May need to check footwear to lesson pressure to area   Lentigines - Scattered tan macules - Due to sun exposure - Benign-appearing, observe - Recommend daily broad spectrum sunscreen SPF 30+ to sun-exposed areas, reapply every 2 hours as needed. - Call for any changes  Seborrheic Keratoses - Stuck-on, waxy, tan-brown papules and/or plaques  - Benign-appearing - Discussed benign etiology and prognosis. - Observe - Call for any changes  Melanocytic Nevi - Tan-brown and/or pink-flesh-colored symmetric macules and papules - Benign appearing on exam today - Observation - Call clinic for new or changing moles - Recommend daily use of broad spectrum spf 30+ sunscreen to sun-exposed areas.   Hemangiomas - Red papules - Discussed benign nature - Observe - Call for any changes  Actinic Damage with PreCancerous Actinic Keratoses Counseling for Topical Chemotherapy Management: Patient exhibits: - Severe, confluent actinic changes with pre-cancerous actinic keratoses that is secondary to cumulative UV radiation exposure over time - Condition that is severe; chronic, not at goal. - diffuse scaly erythematous macules and papules with underlying dyspigmentation - Discussed Prescription "Field Treatment" topical Chemotherapy for Severe, Chronic Confluent Actinic Changes with Pre-Cancerous Actinic Keratoses Field treatment involves treatment of an entire area of skin that has confluent Actinic Changes (Sun/ Ultraviolet light damage) and PreCancerous Actinic Keratoses by method of PhotoDynamic Therapy (PDT) and/or prescription Topical Chemotherapy  agents such as 5-fluorouracil, 5-fluorouracil/calcipotriene, and/or imiquimod.  The purpose is to decrease the number of clinically evident and subclinical PreCancerous lesions to prevent progression to development of skin cancer by chemically destroying early precancer changes that may or may not be visible.  It has been shown to reduce the risk of developing skin cancer in the treated area. As a result of treatment, redness, scaling, crusting, and open sores may occur during treatment course. One or more than one of these methods may be used and may have to be used several times to control, suppress and eliminate the PreCancerous changes. Discussed treatment course, expected reaction, and possible side effects. - Recommend daily broad spectrum sunscreen SPF 30+ to sun-exposed areas, reapply every 2 hours as needed.  - Staying in the shade or wearing long sleeves, sun glasses (UVA+UVB protection) and wide brim hats (4-inch brim around the entire circumference of the hat) are also recommended. - Call for new or changing lesions.  Discussed Red light photodynamic therapy to face.  Patient will hold off on scheduling treatment at this time  History of PreCancerous Actinic Keratosis  Right eyebrow - site(s) of PreCancerous Actinic Keratosis clear today. - these may recur and new lesions may form requiring treatment to prevent transformation into skin cancer - observe for new or changing spots and contact [facility] for  appointment if occur - photoprotection with sun protective clothing; sunglasses and broad spectrum sunscreen with SPF of at least 30 + and frequent self skin exams recommended - yearly exams by a dermatologist recommended for persons with history of PreCancerous Actinic Keratoses  Skin cancer screening performed today. Return in about 1 year (around 12/01/2023) for TBSE.  I, Ruthell Rummage, CMA, am acting as scribe for Brendolyn Patty, MD.  Documentation: I have reviewed the  above documentation for accuracy and completeness, and I agree with the above.  Brendolyn Patty MD

## 2022-12-01 NOTE — Patient Instructions (Addendum)
Hand dermatitis  Hand Dermatitis is a chronic type of eczema that can come and go on the hands and fingers.  While there is no cure, the rash and symptoms can be managed with topical prescription medications, and for more severe cases, with systemic medications.  Recommend mild soap and routine use of moisturizing cream after handwashing.  Minimize soap/water exposure when possible.      Start TMC 0.1% cream daily to twice daily to fingertips until clear, avoid f/g/a Recommend mild soap and moisturizer daily  Recommend Cerave hydrating cleansers  Recommend starting moisturizer with exfoliant (Urea, Salicylic acid, or Lactic acid) one to two times daily to help smooth rough and bumpy skin.  OTC options include Cetaphil Rough and Bumpy lotion (Urea), Eucerin Roughness Relief lotion or spot treatment cream (Urea), CeraVe SA lotion/cream for Rough and Bumpy skin (Sal Acid), Gold Bond Rough and Bumpy cream (Sal Acid), and AmLactin 12% lotion/cream (Lactic Acid).  If applying in morning, also apply sunscreen to sun-exposed areas, since these exfoliating moisturizers can increase sensitivity to sun.      For callus at right great toe  Recommend using Curad Mediplast pads. Cut to fit wart or callus. Cover with Elastoplast waterproof tape or any waterproof band-aid. Change every 3 to 4 days, or sooner if necessary.  Treatment may require several months of regular use before results are seen.     Can continue metronidazole cream apply to face 1 to 2 times daily for rosacea   Rosacea  What is rosacea? Rosacea (say: ro-zay-sha) is a common skin disease that usually begins as a trend of flushing or blushing easily.  As rosacea progresses, a persistent redness in the center of the face will develop and may gradually spread beyond the nose and cheeks to the forehead and chin.  In some cases, the ears, chest, and back could be affected.  Rosacea may appear as tiny blood vessels or small red bumps that  occur in crops.  Frequently they can contain pus, and are called "pustules".  If the bumps do not contain pus, they are referred to as "papules".  Rarely, in prolonged, untreated cases of rosacea, the oil glands of the nose and cheeks may become permanently enlarged.  This is called rhinophyma, and is seen more frequently in men.  Signs and Risks In its beginning stages, rosacea tends to come and go, which makes it difficult to recognize.  It can start as intermittent flushing of the face.  Eventually, blood vessels may become permanently visible.  Pustules and papules can appear, but can be mistaken for adult acne.  People of all races, ages, genders and ethnic groups are at risk of developing rosacea.  However, it is more common in women (especially around menopause) and adults with fair skin between the ages of 26 and 73.  Treatment Dermatologists typically recommend a combination of treatments to effectively manage rosacea.  Treatment can improve symptoms and may stop the progression of the rosacea.  Treatment may involve both topical and oral medications.  The tetracycline antibiotics are often used for their anti-inflammatory effect; however, because of the possibility of developing antibiotic resistance, they should not be used long term at full dose.  For dilated blood vessels the options include electrodessication (uses electric current through a small needle), laser treatment, and cosmetics to hide the redness.   With all forms of treatment, improvement is a slow process, and patients may not see any results for the first 3-4 weeks.  It is  very important to avoid the sun and other triggers.  Patients must wear sunscreen daily.  Skin Care Instructions: Cleanse the skin with a mild soap such as CeraVe cleanser, Cetaphil cleanser, or Dove soap once or twice daily as needed. Moisturize with Eucerin Redness Relief Daily Perfecting Lotion (has a subtle green tint), CeraVe Moisturizing Cream, or Oil of  [place] Daily Moisturizer with sunscreen every morning and/or night as recommended. Makeup should be "non-comedogenic" (won't clog pores) and be labeled "for sensitive skin". Good choices for cosmetics are: Neutrogena, Almay, and Physician's Formula.  Any product with a green tint tends to offset a red complexion. If your eyes are dry and irritated, use artificial tears 2-3 times per day and cleanse the eyelids daily with baby shampoo.  Have your eyes examined at least every 2 years.  Be sure to tell your eye doctor that you have rosacea. Alcoholic beverages tend to cause flushing of the skin, and may make rosacea worse. Always wear sunscreen, protect your skin from extreme hot and cold temperatures, and avoid spicy foods, hot drinks, and mechanical irritation such as rubbing, scrubbing, or massaging the face.  Avoid harsh skin cleansers, cleansing masks, astringents, and exfoliation. If a particular product burns or makes your face feel tight, then it is likely to flare your rosacea. If you are having difficulty finding a sunscreen that you can tolerate, you may try switching to a chemical-free sunscreen.  These are ones whose active ingredient is zinc oxide or titanium dioxide only.  They should also be fragrance free, non-comedogenic, and labeled for sensitive skin. Rosacea triggers may vary from person to person.  There are a variety of foods that have been reported to trigger rosacea.  Some patients find that keeping a diary of what they were doing when they flared helps them avoid triggers.        Seborrheic Keratosis  What causes seborrheic keratoses? Seborrheic keratoses are harmless, common skin growths that first appear during adult life.  As time goes by, more growths appear.  Some people may develop a large number of them.  Seborrheic keratoses appear on both covered and uncovered body parts.  They are not caused by sunlight.  The tendency to develop seborrheic keratoses can be inherited.   They vary in color from skin-colored to gray, brown, or even black.  They can be either smooth or have a rough, warty surface.   Seborrheic keratoses are superficial and look as if they were stuck on the skin.  Under the microscope this type of keratosis looks like layers upon layers of skin.  That is why at times the top layer may seem to fall off, but the rest of the growth remains and re-grows.    Treatment Seborrheic keratoses do not need to be treated, but can easily be removed in the office.  Seborrheic keratoses often cause symptoms when they rub on clothing or jewelry.  Lesions can be in the way of shaving.  If they become inflamed, they can cause itching, soreness, or burning.  Removal of a seborrheic keratosis can be accomplished by freezing, burning, or surgery. If any spot bleeds, scabs, or grows rapidly, please return to have it checked, as these can be an indication of a skin cancer.  Cryotherapy Aftercare  Wash gently with soap and water everyday.   Apply Vaseline and Band-Aid daily until healed.   Actinic keratoses are precancerous spots that appear secondary to cumulative UV radiation exposure/sun exposure over time. They are chronic with  expected duration over 1 year. A portion of actinic keratoses will progress to squamous cell carcinoma of the skin. It is not possible to reliably predict which spots will progress to skin cancer and so treatment is recommended to prevent development of skin cancer.  Recommend daily broad spectrum sunscreen SPF 30+ to sun-exposed areas, reapply every 2 hours as needed.  Recommend staying in the shade or wearing long sleeves, sun glasses (UVA+UVB protection) and wide brim hats (4-inch brim around the entire circumference of the hat). Call for new or changing lesions.      Melanoma ABCDEs  Melanoma is the most dangerous type of skin cancer, and is the leading cause of death from skin disease.  You are more likely to develop melanoma if  you: Have light-colored skin, light-colored eyes, or red or blond hair Spend a lot of time in the sun Tan regularly, either outdoors or in a tanning bed Have had blistering sunburns, especially during childhood Have a close family member who has had a melanoma Have atypical moles or large birthmarks  Early detection of melanoma is key since treatment is typically straightforward and cure rates are extremely high if we catch it early.   The first sign of melanoma is often a change in a mole or a new dark spot.  The ABCDE system is a way of remembering the signs of melanoma.  A for asymmetry:  The two halves do not match. B for border:  The edges of the growth are irregular. C for color:  A mixture of colors are present instead of an even brown color. D for diameter:  Melanomas are usually (but not always) greater than 22m - the size of a pencil eraser. E for evolution:  The spot keeps changing in size, shape, and color.  Please check your skin once per month between visits. You can use a small mirror in front and a large mirror behind you to keep an eye on the back side or your body.   If you see any new or changing lesions before your next follow-up, please call to schedule a visit.  Please continue daily skin protection including broad spectrum sunscreen SPF 30+ to sun-exposed areas, reapplying every 2 hours as needed when you're outdoors.   Staying in the shade or wearing long sleeves, sun glasses (UVA+UVB protection) and wide brim hats (4-inch brim around the entire circumference of the hat) are also recommended for sun protection.    Due to recent changes in healthcare laws, you may see results of your pathology and/or laboratory studies on MyChart before the doctors have had a chance to review them. We understand that in some cases there may be results that are confusing or concerning to you. Please understand that not all results are received at the same time and often the doctors may  need to interpret multiple results in order to provide you with the best plan of care or course of treatment. Therefore, we ask that you please give u[place]2 business days to thoroughly review all your results before contacting the office for clarification. Should we see a critical lab result, you will be contacted sooner.   If You Need Anything After Your Visit  If you have any questions or concerns for your doctor, please call our main line at [phone] press option 4 to reach your doctor's medical assistant. If no one answers, please leave a voicemail as directed and we will return your call as soon as possible. Messages  left after 4 pm will be answered the following business day.   You may also send [place] a message via [place]. We typically respond to MyChart messages within 1-2 business days.  For prescription refills, please ask your pharmacy to contact our office. Our fax number is [phone].  If you have an urgent issue when the clinic is closed that cannot wait until the next business day, you can page your doctor at the number below.    Please note that while we do our best to be available for urgent issues outside of office hours, we are not available 24/7.   If you have an urgent issue and are unable to reach [place], you may choose to seek medical care at your doctor's office, retail clinic, urgent care center, or emergency room.  If you have a medical emergency, please immediately call 911 or go to the emergency department.  Pager Numbers  - Dr. Nehemiah Massed: [phone]  - Dr. Laurence Ferrari: [phone]  - Dr. Nicole Kindred: [phone]  In the event of inclement weather, please call our main line at [phone] for an update on the status of any delays or closures.  Dermatology Medication Tips: Please keep the boxes that topical medications come in in order to help keep track of the instructions about where and how to use these. Pharmacies typically print the medication instructions only  on the boxes and not directly on the medication tubes.   If your medication is too expensive, please contact our office at [phone] option 4 or send [place] a message through [place].   We are unable to tell what your co-pay for medications will be in advance as this is different depending on your insurance coverage. However, we may be able to find a substitute medication at lower cost or fill out paperwork to get insurance to cover a needed medication.   If a prior authorization is required to get your medication covered by your insurance company, please allow [place] 1-2 business days to complete this process.  Drug prices often vary depending on where the prescription is filled and some pharmacies may offer cheaper prices.  The website www.goodrx.com contains coupons for medications through different pharmacies. The prices here do not account for what the cost may be with help from insurance (it may be cheaper with your insurance), but the website can give you the price if you did not use any insurance.  - You can print the associated coupon and take it with your prescription to the pharmacy.  - You may also stop by our office during regular business hours and pick up a GoodRx coupon card.  - If you need your prescription sent electronically to a different pharmacy, notify our office through [facility] or by phone at [phone] option 4.     Si Usted Necesita Algo Despus de Su Visita  Tambin puede enviarnos un mensaje a travs de Pharmacist, community. Por lo general respondemos a los mensajes de MyChart en el transcurso de 1 a 2 das hbiles.  Para renovar recetas, por favor pida a su farmacia que se ponga en contacto con nuestra oficina. Harland Dingwall de fax es [place] [phone].  Si tiene un asunto urgente cuando la clnica est cerrada y que no puede esperar hasta el siguiente da hbil, puede llamar/localizar a su doctor(a) al nmero que aparece a continuacin.   Por favor, tenga en cuenta  que aunque hacemos todo lo posible para estar disponibles para asuntos urgentes fuera del horario de oficina, no estamos  disponibles las 24 horas del da, los 7 das de la [place].   Si tiene un problema urgente y no puede comunicarse con nosotros, puede optar por buscar atencin mdica  en el consultorio de su doctor(a), en una clnica privada, en un centro de atencin urgente o en una sala de emergencias.  Si tiene Engineering geologist, por favor llame inmediatamente al 911 o vaya a la sala de emergencias.  Nmeros de bper  - Dr. Nehemiah Massed: [phone]  - Dra. Moye: [phone]  - Dra. Nicole Kindred: [phone]  En caso de inclemencias del [place], por favor llame a Johnsie Kindred principal al [phone] para una actualizacin sobre el [place] de cualquier retraso o cierre.  Consejos para la medicacin en dermatologa: Por favor, guarde las cajas en las que vienen los medicamentos de uso tpico para ayudarle a seguir las instrucciones sobre dnde y cmo usarlos. Las farmacias generalmente imprimen las instrucciones del medicamento slo en las cajas y no directamente en los tubos del [place].   Si su medicamento es muy caro, por favor, pngase en contacto con Zigmund Daniel llamando al [phone] y presione la opcin 4 o envenos un mensaje a travs de Pharmacist, community.   No podemos decirle cul ser su copago por los medicamentos por adelantado ya que esto es diferente dependiendo de la cobertura de su seguro. Sin embargo, es posible que podamos encontrar un medicamento sustituto a Electrical engineer un formulario para que el seguro cubra el medicamento que se considera necesario.   Si se requiere una autorizacin previa para que su compaa de seguros [place] su medicamento, por favor permtanos de 1 a 2 das hbiles para completar este proceso.  Los precios de los medicamentos varan con frecuencia dependiendo del Environmental consultant de dnde se surte la receta y alguna farmacias pueden ofrecer precios ms  baratos.  El sitio web www.goodrx.com tiene cupones para medicamentos de Airline pilot. Los precios aqu no tienen en cuenta lo que podra costar con la ayuda del seguro (puede ser ms barato con su seguro), pero el sitio web puede darle el precio si no utiliz Research scientist (physical sciences).  - Puede imprimir el cupn correspondiente y llevarlo con su receta a la farmacia.  - Tambin puede pasar por nuestra oficina durante el horario de atencin regular y Charity fundraiser una tarjeta de cupones de GoodRx.  - Si necesita que su receta se enve electrnicamente a una farmacia diferente, informe a nuestra oficina a travs de MyChart de [place] o por telfono llamando al [phone] y presione la opcin 4.

## 2022-12-02 DIAGNOSIS — M7581 Other shoulder lesions, right shoulder: Secondary | ICD-10-CM | POA: Diagnosis not present

## 2022-12-02 DIAGNOSIS — M25611 Stiffness of right shoulder, not elsewhere classified: Secondary | ICD-10-CM | POA: Diagnosis not present

## 2022-12-02 DIAGNOSIS — M75101 Unspecified rotator cuff tear or rupture of right shoulder, not specified as traumatic: Secondary | ICD-10-CM | POA: Diagnosis not present

## 2022-12-02 DIAGNOSIS — M25511 Pain in right shoulder: Secondary | ICD-10-CM | POA: Diagnosis not present

## 2022-12-07 DIAGNOSIS — M75101 Unspecified rotator cuff tear or rupture of right shoulder, not specified as traumatic: Secondary | ICD-10-CM | POA: Diagnosis not present

## 2022-12-07 DIAGNOSIS — M25611 Stiffness of right shoulder, not elsewhere classified: Secondary | ICD-10-CM | POA: Diagnosis not present

## 2022-12-07 DIAGNOSIS — M25511 Pain in right shoulder: Secondary | ICD-10-CM | POA: Diagnosis not present

## 2022-12-07 DIAGNOSIS — M7581 Other shoulder lesions, right shoulder: Secondary | ICD-10-CM | POA: Diagnosis not present

## 2022-12-14 DIAGNOSIS — M75101 Unspecified rotator cuff tear or rupture of right shoulder, not specified as traumatic: Secondary | ICD-10-CM | POA: Diagnosis not present

## 2022-12-14 DIAGNOSIS — M25511 Pain in right shoulder: Secondary | ICD-10-CM | POA: Diagnosis not present

## 2022-12-14 DIAGNOSIS — M25611 Stiffness of right shoulder, not elsewhere classified: Secondary | ICD-10-CM | POA: Diagnosis not present

## 2022-12-14 DIAGNOSIS — M7581 Other shoulder lesions, right shoulder: Secondary | ICD-10-CM | POA: Diagnosis not present

## 2022-12-16 DIAGNOSIS — M75101 Unspecified rotator cuff tear or rupture of right shoulder, not specified as traumatic: Secondary | ICD-10-CM | POA: Diagnosis not present

## 2022-12-16 DIAGNOSIS — M25511 Pain in right shoulder: Secondary | ICD-10-CM | POA: Diagnosis not present

## 2022-12-16 DIAGNOSIS — M7581 Other shoulder lesions, right shoulder: Secondary | ICD-10-CM | POA: Diagnosis not present

## 2022-12-16 DIAGNOSIS — M25611 Stiffness of right shoulder, not elsewhere classified: Secondary | ICD-10-CM | POA: Diagnosis not present

## 2022-12-21 DIAGNOSIS — M75101 Unspecified rotator cuff tear or rupture of right shoulder, not specified as traumatic: Secondary | ICD-10-CM | POA: Diagnosis not present

## 2022-12-21 DIAGNOSIS — M7581 Other shoulder lesions, right shoulder: Secondary | ICD-10-CM | POA: Diagnosis not present

## 2022-12-21 DIAGNOSIS — M25611 Stiffness of right shoulder, not elsewhere classified: Secondary | ICD-10-CM | POA: Diagnosis not present

## 2022-12-21 DIAGNOSIS — M25511 Pain in right shoulder: Secondary | ICD-10-CM | POA: Diagnosis not present

## 2022-12-23 DIAGNOSIS — M25511 Pain in right shoulder: Secondary | ICD-10-CM | POA: Diagnosis not present

## 2022-12-23 DIAGNOSIS — M75101 Unspecified rotator cuff tear or rupture of right shoulder, not specified as traumatic: Secondary | ICD-10-CM | POA: Diagnosis not present

## 2022-12-23 DIAGNOSIS — M7581 Other shoulder lesions, right shoulder: Secondary | ICD-10-CM | POA: Diagnosis not present

## 2022-12-23 DIAGNOSIS — M25611 Stiffness of right shoulder, not elsewhere classified: Secondary | ICD-10-CM | POA: Diagnosis not present

## 2022-12-28 DIAGNOSIS — M75101 Unspecified rotator cuff tear or rupture of right shoulder, not specified as traumatic: Secondary | ICD-10-CM | POA: Diagnosis not present

## 2022-12-28 DIAGNOSIS — M7581 Other shoulder lesions, right shoulder: Secondary | ICD-10-CM | POA: Diagnosis not present

## 2022-12-28 DIAGNOSIS — M25511 Pain in right shoulder: Secondary | ICD-10-CM | POA: Diagnosis not present

## 2022-12-28 DIAGNOSIS — M25611 Stiffness of right shoulder, not elsewhere classified: Secondary | ICD-10-CM | POA: Diagnosis not present

## 2022-12-30 DIAGNOSIS — M7581 Other shoulder lesions, right shoulder: Secondary | ICD-10-CM | POA: Diagnosis not present

## 2022-12-30 DIAGNOSIS — M25511 Pain in right shoulder: Secondary | ICD-10-CM | POA: Diagnosis not present

## 2022-12-30 DIAGNOSIS — M75101 Unspecified rotator cuff tear or rupture of right shoulder, not specified as traumatic: Secondary | ICD-10-CM | POA: Diagnosis not present

## 2022-12-30 DIAGNOSIS — M25611 Stiffness of right shoulder, not elsewhere classified: Secondary | ICD-10-CM | POA: Diagnosis not present

## 2023-01-06 DIAGNOSIS — K080 Exfoliation of teeth due to systemic causes: Secondary | ICD-10-CM | POA: Diagnosis not present

## 2023-01-07 DIAGNOSIS — M7581 Other shoulder lesions, right shoulder: Secondary | ICD-10-CM | POA: Diagnosis not present

## 2023-01-07 DIAGNOSIS — M25611 Stiffness of right shoulder, not elsewhere classified: Secondary | ICD-10-CM | POA: Diagnosis not present

## 2023-01-07 DIAGNOSIS — M75101 Unspecified rotator cuff tear or rupture of right shoulder, not specified as traumatic: Secondary | ICD-10-CM | POA: Diagnosis not present

## 2023-01-07 DIAGNOSIS — M25511 Pain in right shoulder: Secondary | ICD-10-CM | POA: Diagnosis not present

## 2023-01-12 DIAGNOSIS — M7581 Other shoulder lesions, right shoulder: Secondary | ICD-10-CM | POA: Diagnosis not present

## 2023-01-12 DIAGNOSIS — M75101 Unspecified rotator cuff tear or rupture of right shoulder, not specified as traumatic: Secondary | ICD-10-CM | POA: Diagnosis not present

## 2023-01-12 DIAGNOSIS — M25511 Pain in right shoulder: Secondary | ICD-10-CM | POA: Diagnosis not present

## 2023-01-12 DIAGNOSIS — M25611 Stiffness of right shoulder, not elsewhere classified: Secondary | ICD-10-CM | POA: Diagnosis not present

## 2023-01-15 DIAGNOSIS — M25511 Pain in right shoulder: Secondary | ICD-10-CM | POA: Diagnosis not present

## 2023-01-15 DIAGNOSIS — M25611 Stiffness of right shoulder, not elsewhere classified: Secondary | ICD-10-CM | POA: Diagnosis not present

## 2023-01-15 DIAGNOSIS — M75101 Unspecified rotator cuff tear or rupture of right shoulder, not specified as traumatic: Secondary | ICD-10-CM | POA: Diagnosis not present

## 2023-01-15 DIAGNOSIS — M7581 Other shoulder lesions, right shoulder: Secondary | ICD-10-CM | POA: Diagnosis not present

## 2023-01-19 DIAGNOSIS — M25611 Stiffness of right shoulder, not elsewhere classified: Secondary | ICD-10-CM | POA: Diagnosis not present

## 2023-01-19 DIAGNOSIS — M7581 Other shoulder lesions, right shoulder: Secondary | ICD-10-CM | POA: Diagnosis not present

## 2023-01-19 DIAGNOSIS — M25511 Pain in right shoulder: Secondary | ICD-10-CM | POA: Diagnosis not present

## 2023-01-19 DIAGNOSIS — M75101 Unspecified rotator cuff tear or rupture of right shoulder, not specified as traumatic: Secondary | ICD-10-CM | POA: Diagnosis not present

## 2023-01-20 DIAGNOSIS — M720 Palmar fascial fibromatosis [Dupuytren]: Secondary | ICD-10-CM | POA: Diagnosis not present

## 2023-01-20 DIAGNOSIS — M13841 Other specified arthritis, right hand: Secondary | ICD-10-CM | POA: Diagnosis not present

## 2023-01-21 DIAGNOSIS — M720 Palmar fascial fibromatosis [Dupuytren]: Secondary | ICD-10-CM | POA: Insufficient documentation

## 2023-01-22 ENCOUNTER — Ambulatory Visit (INDEPENDENT_AMBULATORY_CARE_PROVIDER_SITE_OTHER): Payer: Medicare Other | Admitting: Family Medicine

## 2023-01-22 ENCOUNTER — Encounter: Payer: Self-pay | Admitting: Family Medicine

## 2023-01-22 VITALS — BP 120/72 | HR 76 | Ht 59.0 in | Wt 152.0 lb

## 2023-01-22 DIAGNOSIS — M25511 Pain in right shoulder: Secondary | ICD-10-CM | POA: Diagnosis not present

## 2023-01-22 DIAGNOSIS — M940 Chondrocostal junction syndrome [Tietze]: Secondary | ICD-10-CM | POA: Diagnosis not present

## 2023-01-22 DIAGNOSIS — M75101 Unspecified rotator cuff tear or rupture of right shoulder, not specified as traumatic: Secondary | ICD-10-CM | POA: Diagnosis not present

## 2023-01-22 DIAGNOSIS — S29011A Strain of muscle and tendon of front wall of thorax, initial encounter: Secondary | ICD-10-CM | POA: Diagnosis not present

## 2023-01-22 DIAGNOSIS — M7581 Other shoulder lesions, right shoulder: Secondary | ICD-10-CM | POA: Diagnosis not present

## 2023-01-22 DIAGNOSIS — M25611 Stiffness of right shoulder, not elsewhere classified: Secondary | ICD-10-CM | POA: Diagnosis not present

## 2023-01-22 DIAGNOSIS — J301 Allergic rhinitis due to pollen: Secondary | ICD-10-CM

## 2023-01-22 MED ORDER — OLOPATADINE HCL 0.1 % OP SOLN: 1.0000 [drp] | Freq: Two times a day (BID) | OPHTHALMIC | 12 refills | Status: DC

## 2023-01-22 NOTE — Progress Notes (Signed)
Date:  01/22/2023   Name:  Jennifer Kirby   DOB:  Aug 06, 1956   MRN:  [id number]   Chief Complaint: breast swelling (More swelling since PT s/p therapy- needs breast exam) and Eye Problem (R) eye- "make sure it is not pink eye")  Eye Problem  The right eye is affected. This is a new problem. The current episode started 1 to 4 weeks ago. The problem occurs daily. The pain is mild. Pertinent negatives include no blurred vision, double vision, eye redness, fever, foreign body sensation, itching or photophobia. She has tried nothing for the symptoms.    Lab Results  Component Value Date   NA 141 11/05/2022   K 4.4 11/05/2022   CO2 23 11/05/2022   GLUCOSE 95 11/05/2022   BUN 11 11/05/2022   CREATININE 0.57 11/05/2022   CALCIUM 9.3 11/05/2022   EGFR 99 11/05/2022   GFRNONAA >60 08/25/2022   Lab Results  Component Value Date   CHOL 251 (H) 11/05/2022   HDL 87 11/05/2022   LDLCALC 149 (H) 11/05/2022   TRIG 90 11/05/2022   CHOLHDL 3.5 07/28/2017   No results found for: "TSH" No results found for: "HGBA1C" Lab Results  Component Value Date   WBC 6.4 08/25/2022   HGB 13.8 08/25/2022   HCT 40.3 08/25/2022   MCV 86.1 08/25/2022   PLT 283 08/25/2022   Lab Results  Component Value Date   ALT 16 11/05/2022   AST 23 11/05/2022   ALKPHOS 68 11/05/2022   BILITOT 0.4 11/05/2022   No results found for: "25OHVITD2", "25OHVITD3", "VD25OH"   Review of Systems  Constitutional:  Negative for fatigue and fever.  HENT:  Negative for facial swelling, mouth sores, nosebleeds, sneezing and sore throat.   Eyes:  Negative for blurred vision, double vision, photophobia, redness and itching.  Respiratory:  Negative for apnea, cough, choking, chest tightness, shortness of breath, wheezing and stridor.        Chest wall discomfort  Cardiovascular:  Negative for chest pain, palpitations and leg swelling.  Gastrointestinal:  Negative for abdominal pain and blood in stool.    Patient  Active Problem List   Diagnosis Date Noted   Seasonal allergic rhinitis due to pollen 07/25/2021   Gastroesophageal reflux disease 07/25/2021   Traumatic partial tear of left biceps tendon 05/26/2019   Traumatic complete tear of left rotator cuff 05/26/2019   Synovitis of left shoulder 05/26/2019   Subacromial impingement, left 05/26/2019   Obesity (BMI 30-39.9) 11/25/2018   Contact dermatitis 02/15/2018   Mass of skin of right shoulder 07/27/2016   Brachial neuritis 03/08/2015   Encounter for general adult medical examination without abnormal findings 03/08/2015   Herpes zoster 03/08/2015   H/O hypercholesterolemia 03/08/2015   HLD (hyperlipidemia) 03/08/2015    Allergies  Allergen Reactions   Statins     Makes me hurt    Zetia [Ezetimibe] Other (See Comments)    Gave leg cramps    Past Surgical History:  Procedure Laterality Date   COLONOSCOPY  11/12/2017   FOOT FRACTURE SURGERY Left    PLANTAR FASCIECTOMY Left 2008   ROTATOR CUFF REPAIR Left 05/26/2019   bicep tear, tendon repair and skin graft - [facility]    SHOULDER ARTHROSCOPY Right 08/27/2022   Procedure: Right shoulder arthroscopic versus mini open subscapularis and supraspinatus repair, subacromial decompression, distal clavicle excision, and biceps tenodesis;  Surgeon: Signa Kell, MD;  Location: [facility];  Service: Orthopedics;  Laterality: Right;    Social History  Tobacco Use   Smoking status: Never   Smokeless tobacco: Never  Vaping Use   Vaping Use: Never used  Substance Use Topics   Alcohol use: Not Currently   Drug use: No     Medication list has been reviewed and updated.  Current Meds  Medication Sig   acetaminophen (TYLENOL) 500 MG tablet Take 2 tablets (1,000 mg total) by mouth every 8 (eight) hours.   calcium carbonate (TUMS - DOSED IN MG ELEMENTAL CALCIUM) 500 MG chewable tablet Chew 1 tablet by mouth daily as needed.   metroNIDAZOLE (METROCREAM) 0.75 % cream Apply to mid face 1-2 times a  day for rosacea.   montelukast (SINGULAIR) 10 MG tablet Take 1 tablet (10 mg total) by mouth as needed.   pantoprazole (PROTONIX) 40 MG tablet Take 1 tablet (40 mg total) by mouth daily.   rosuvastatin (CRESTOR) 5 MG tablet TAKE 1 TABLET (5 MG TOTAL) BY MOUTH 3 times A WEEK ON TUESDAY AND THURSDAY and Saturday   Sennosides-Docusate Sodium (SENNA PLUS) 8.6-50 MG CAPS Take 1 capsule by mouth daily.   triamcinolone cream (KENALOG) 0.1 % Apply 1 Application topically as directed. Qd to bid to fingertips until clear, then prn flares, avoid face, groin, axilla       01/22/2023    3:02 PM 11/27/2022    9:13 AM 11/05/2022    8:15 AM 01/23/2022    8:30 AM  GAD 7 : Generalized Anxiety Score  Nervous, Anxious, on Edge 0 0 0 0  Control/stop worrying 0 0 0 0  Worry too much - different things 0 0 0 0  Trouble relaxing 0 0 0 0  Restless 0 0 0 0  Easily annoyed or irritable 0 0 0 0  Afraid - awful might happen 0 0 0 0  Total GAD 7 Score 0 0 0 0  Anxiety Difficulty Not difficult at all Not difficult at all Not difficult at all Not difficult at all       01/22/2023    3:02 PM 11/27/2022    9:12 AM 11/05/2022    8:14 AM  Depression screen PHQ 2/9  Decreased Interest 0 0 0  Down, Depressed, Hopeless 0 0 0  PHQ - 2 Score 0 0 0  Altered sleeping 0 0 0  Tired, decreased energy 0 0 0  Change in appetite 0 0 0  Feeling bad or failure about yourself  0 0 0  Trouble concentrating 0 0 0  Moving slowly or fidgety/restless 0 0 0  Suicidal thoughts 0 0 0  PHQ-9 Score 0 0 0  Difficult doing work/chores Not difficult at all Not difficult at all Not difficult at all    BP Readings from Last 3 Encounters:  01/22/23 120/72  12/01/22 117/85  11/27/22 120/80    Physical Exam Vitals and nursing note reviewed. Exam conducted with a chaperone present.  Constitutional:      General: She is not in acute distress.    Appearance: She is not diaphoretic.  HENT:     Head: Normocephalic.     Right Ear: Tympanic  membrane and external ear normal.     Left Ear: Tympanic membrane and external ear normal.     Nose: Nose normal. No congestion or rhinorrhea.     Mouth/Throat:     Mouth: Mucous membranes are moist.  Eyes:     General: Lids are normal. Lids are everted, no foreign bodies appreciated. Vision grossly intact. Gaze aligned appropriately. No allergic shiner.  Right eye: No discharge.        Left eye: No discharge.     Conjunctiva/sclera: Conjunctivae normal.     Right eye: Right conjunctiva is not injected.     Left eye: Left conjunctiva is not injected.     Pupils: Pupils are equal, round, and reactive to light.  Neck:     Thyroid: No thyromegaly.     Vascular: No JVD.  Cardiovascular:     Rate and Rhythm: Normal rate and regular rhythm.     Heart sounds: Normal heart sounds, S1 normal and S2 normal. No murmur heard.    No systolic murmur is present.     No diastolic murmur is present.     No friction rub. No gallop. No S3 or S4 sounds.  Pulmonary:     Effort: Pulmonary effort is normal.     Breath sounds: Normal breath sounds. No decreased air movement. No decreased breath sounds, wheezing, rhonchi or rales.  Chest:     Chest wall: Tenderness present. No mass.  Breasts:    Right: Normal. No swelling, bleeding, inverted nipple, mass, nipple discharge, skin change or tenderness.     Left: Normal. No swelling, bleeding, inverted nipple, mass, nipple discharge, skin change or tenderness.     Comments: Costochondral tenderness Musculoskeletal:        General: Normal range of motion.     Cervical back: Neck supple.  Lymphadenopathy:     Cervical: No cervical adenopathy.  Skin:    General: Skin is warm and dry.  Neurological:     Mental Status: She is alert.     Deep Tendon Reflexes: Reflexes are normal and symmetric.     Wt Readings from Last 3 Encounters:  01/22/23 152 lb (68.9 kg)  11/27/22 151 lb (68.5 kg)  11/05/22 149 lb (67.6 kg)    BP 120/72   Pulse 76   Ht 4'  11" (1.499 m)   Wt 152 lb (68.9 kg)   SpO2 97%   BMI 30.70 kg/m   Assessment and Plan:  1. Costochondritis New onset.  Persistent.  Tenderness along the second third and fourth costochondral margins on the right side.  He does well as lateral involving either the pectoral muscle or intercostal muscles.  2. Intercostal muscle strain, initial encounter New onset.  Persistent.  Stable.  Also noted between the second and third fourth intercostal areas is some tenderness consistent with intercostal strain or overlying pectoral muscle.  3. Seasonal allergic rhinitis due to pollen New onset persistent.  Looking beneath the eyelid is unremarkable.  Mild erythema in the lateral aspect involving both common cheek type of the lower lids.  Likely of an allergic nature and I have suggested Patanol or Pataday during the season and to use protective eyewear while mowing. - olopatadine (PATANOL) 0.1 % ophthalmic solution; Place 1 drop into both eyes 2 (two) times daily.  Dispense: 5 mL; Refill: 12    Elizabeth Sauer, MD

## 2023-01-26 DIAGNOSIS — M25611 Stiffness of right shoulder, not elsewhere classified: Secondary | ICD-10-CM | POA: Diagnosis not present

## 2023-01-26 DIAGNOSIS — M25511 Pain in right shoulder: Secondary | ICD-10-CM | POA: Diagnosis not present

## 2023-01-26 DIAGNOSIS — M7581 Other shoulder lesions, right shoulder: Secondary | ICD-10-CM | POA: Diagnosis not present

## 2023-01-26 DIAGNOSIS — M75101 Unspecified rotator cuff tear or rupture of right shoulder, not specified as traumatic: Secondary | ICD-10-CM | POA: Diagnosis not present

## 2023-02-09 DIAGNOSIS — M25511 Pain in right shoulder: Secondary | ICD-10-CM | POA: Diagnosis not present

## 2023-02-09 DIAGNOSIS — M7581 Other shoulder lesions, right shoulder: Secondary | ICD-10-CM | POA: Diagnosis not present

## 2023-02-09 DIAGNOSIS — M75101 Unspecified rotator cuff tear or rupture of right shoulder, not specified as traumatic: Secondary | ICD-10-CM | POA: Diagnosis not present

## 2023-02-09 DIAGNOSIS — M25611 Stiffness of right shoulder, not elsewhere classified: Secondary | ICD-10-CM | POA: Diagnosis not present

## 2023-02-25 DIAGNOSIS — H1045 Other chronic allergic conjunctivitis: Secondary | ICD-10-CM | POA: Diagnosis not present

## 2023-02-25 DIAGNOSIS — H43811 Vitreous degeneration, right eye: Secondary | ICD-10-CM | POA: Diagnosis not present

## 2023-02-25 DIAGNOSIS — H2513 Age-related nuclear cataract, bilateral: Secondary | ICD-10-CM | POA: Diagnosis not present

## 2023-05-03 ENCOUNTER — Ambulatory Visit (INDEPENDENT_AMBULATORY_CARE_PROVIDER_SITE_OTHER): Payer: Medicare Other | Admitting: Family Medicine

## 2023-05-03 ENCOUNTER — Encounter: Payer: Self-pay | Admitting: Family Medicine

## 2023-05-03 VITALS — BP 100/70 | HR 77 | Ht 59.0 in | Wt 151.0 lb

## 2023-05-03 DIAGNOSIS — E7801 Familial hypercholesterolemia: Secondary | ICD-10-CM

## 2023-05-03 DIAGNOSIS — K219 Gastro-esophageal reflux disease without esophagitis: Secondary | ICD-10-CM

## 2023-05-03 DIAGNOSIS — S39012A Strain of muscle, fascia and tendon of lower back, initial encounter: Secondary | ICD-10-CM | POA: Diagnosis not present

## 2023-05-03 MED ORDER — ROSUVASTATIN CALCIUM 5 MG PO TABS: ORAL_TABLET | ORAL | Status: DC

## 2023-05-03 MED ORDER — PANTOPRAZOLE SODIUM 40 MG PO TBEC: 40.0000 mg | DELAYED_RELEASE_TABLET | Freq: Every day | ORAL | Status: DC

## 2023-05-03 MED ORDER — FAMOTIDINE 40 MG PO TABS: 40.0000 mg | ORAL_TABLET | Freq: Every day | ORAL | 11 refills | Status: DC

## 2023-05-03 MED ORDER — CYCLOBENZAPRINE HCL 10 MG PO TABS
10.0000 mg | ORAL_TABLET | Freq: Three times a day (TID) | ORAL | 0 refills | Status: DC | PRN
Start: 2023-05-03 — End: 2023-12-13

## 2023-05-03 NOTE — Progress Notes (Signed)
Date:  05/03/2023   Name:  Jennifer Kirby   DOB:  [birth year]   MRN:  [id number]   Chief Complaint: Back Pain, Gastroesophageal Reflux, and Hyperlipidemia  Back Pain This is a new problem. The current episode started yesterday. The problem occurs constantly. The problem has been gradually improving since onset. The pain is present in the lumbar spine. The quality of the pain is described as aching. The pain does not radiate. The pain is moderate. The symptoms are aggravated by sitting. Pertinent negatives include no abdominal pain, chest pain, fever, numbness, paresthesias, weakness or weight loss. The treatment provided mild relief.  Gastroesophageal Reflux She complains of heartburn. She reports no abdominal pain, no chest pain, no dysphagia, no hoarse voice or no water brash. This is a chronic problem. The current episode started in the past 7 days. The problem has been gradually improving. The heartburn is located in the substernum. Pertinent negatives include no weight loss. She has tried a PPI for the symptoms. The treatment provided mild relief.  Hyperlipidemia This is a chronic problem. The current episode started more than 1 year ago. The problem is controlled. Recent lipid tests were reviewed and are variable. Pertinent negatives include no chest pain, myalgias or shortness of breath. Current antihyperlipidemic treatment includes statins. The current treatment provides moderate improvement of lipids. There are no compliance problems.     Lab Results  Component Value Date   NA 141 11/05/2022   K 4.4 11/05/2022   CO2 23 11/05/2022   GLUCOSE 95 11/05/2022   BUN 11 11/05/2022   CREATININE 0.57 11/05/2022   CALCIUM 9.3 11/05/2022   EGFR 99 11/05/2022   GFRNONAA >60 08/25/2022   Lab Results  Component Value Date   CHOL 251 (H) 11/05/2022   HDL 87 11/05/2022   LDLCALC 149 (H) 11/05/2022   TRIG 90 11/05/2022   CHOLHDL 3.5 07/28/2017   No results found for: "TSH" No  results found for: "HGBA1C" Lab Results  Component Value Date   WBC 6.4 08/25/2022   HGB 13.8 08/25/2022   HCT 40.3 08/25/2022   MCV 86.1 08/25/2022   PLT 283 08/25/2022   Lab Results  Component Value Date   ALT 16 11/05/2022   AST 23 11/05/2022   ALKPHOS 68 11/05/2022   BILITOT 0.4 11/05/2022   No results found for: "25OHVITD2", "25OHVITD3", "VD25OH"   Review of Systems  Constitutional:  Negative for fever and weight loss.  HENT:  Negative for hoarse voice.   Respiratory:  Negative for shortness of breath.   Cardiovascular:  Negative for chest pain.  Gastrointestinal:  Positive for heartburn. Negative for abdominal pain and dysphagia.  Musculoskeletal:  Positive for back pain. Negative for myalgias.  Neurological:  Negative for weakness, numbness and paresthesias.    Patient Active Problem List   Diagnosis Date Noted   Seasonal allergic rhinitis due to pollen 07/25/2021   Gastroesophageal reflux disease 07/25/2021   Traumatic partial tear of left biceps tendon 05/26/2019   Traumatic complete tear of left rotator cuff 05/26/2019   Synovitis of left shoulder 05/26/2019   Subacromial impingement, left 05/26/2019   Obesity (BMI 30-39.9) 11/25/2018   Contact dermatitis 02/15/2018   Mass of skin of right shoulder 07/27/2016   Brachial neuritis 03/08/2015   Encounter for general adult medical examination without abnormal findings 03/08/2015   Herpes zoster 03/08/2015   H/O hypercholesterolemia 03/08/2015   HLD (hyperlipidemia) 03/08/2015    Allergies  Allergen Reactions   Statins  Makes me hurt    Zetia [Ezetimibe] Other (See Comments)    Gave leg cramps    Past Surgical History:  Procedure Laterality Date   COLONOSCOPY  11/12/2017   FOOT FRACTURE SURGERY Left    PLANTAR FASCIECTOMY Left 2008   ROTATOR CUFF REPAIR Left 05/26/2019   bicep tear, tendon repair and skin graft - [facility]    SHOULDER ARTHROSCOPY Right 08/27/2022   Procedure: Right shoulder  arthroscopic versus mini open subscapularis and supraspinatus repair, subacromial decompression, distal clavicle excision, and biceps tenodesis;  Surgeon: Signa Kell, MD;  Location: [facility];  Service: Orthopedics;  Laterality: Right;    Social History   Tobacco Use   Smoking status: Never   Smokeless tobacco: Never  Vaping Use   Vaping status: Never Used  Substance Use Topics   Alcohol use: Not Currently   Drug use: No     Medication list has been reviewed and updated.  Current Meds  Medication Sig   acetaminophen (TYLENOL) 500 MG tablet Take 2 tablets (1,000 mg total) by mouth every 8 (eight) hours.   calcium carbonate (TUMS - DOSED IN MG ELEMENTAL CALCIUM) 500 MG chewable tablet Chew 1 tablet by mouth daily as needed.   montelukast (SINGULAIR) 10 MG tablet Take 1 tablet (10 mg total) by mouth as needed.   pantoprazole (PROTONIX) 40 MG tablet Take 1 tablet (40 mg total) by mouth daily.   rosuvastatin (CRESTOR) 5 MG tablet TAKE 1 TABLET (5 MG TOTAL) BY MOUTH 3 times A WEEK ON TUESDAY AND THURSDAY and Saturday   Sennosides-Docusate Sodium (SENNA PLUS) 8.6-50 MG CAPS Take 1 capsule by mouth daily.       05/03/2023   11:08 AM 01/22/2023    3:02 PM 11/27/2022    9:13 AM 11/05/2022    8:15 AM  GAD 7 : Generalized Anxiety Score  Nervous, Anxious, on Edge 0 0 0 0  Control/stop worrying 0 0 0 0  Worry too much - different things 0 0 0 0  Trouble relaxing 0 0 0 0  Restless 0 0 0 0  Easily annoyed or irritable 0 0 0 0  Afraid - awful might happen 0 0 0 0  Total GAD 7 Score 0 0 0 0  Anxiety Difficulty Not difficult at all Not difficult at all Not difficult at all Not difficult at all       05/03/2023   11:08 AM 01/22/2023    3:02 PM 11/27/2022    9:12 AM  Depression screen PHQ 2/9  Decreased Interest 0 0 0  Down, Depressed, Hopeless 0 0 0  PHQ - 2 Score 0 0 0  Altered sleeping 0 0 0  Tired, decreased energy 0 0 0  Change in appetite 0 0 0  Feeling bad or failure about yourself   0 0 0  Trouble concentrating 0 0 0  Moving slowly or fidgety/restless 0 0 0  Suicidal thoughts 0 0 0  PHQ-9 Score 0 0 0  Difficult doing work/chores Not difficult at all Not difficult at all Not difficult at all    BP Readings from Last 3 Encounters:  05/03/23 100/70  01/22/23 120/72  12/01/22 117/85    Physical Exam Vitals and nursing note reviewed. Exam conducted with a chaperone present.  Constitutional:      General: She is not in acute distress.    Appearance: She is not diaphoretic.  HENT:     Head: Normocephalic and atraumatic.     Right Ear:  External ear normal.     Left Ear: External ear normal.     Nose: Nose normal.  Eyes:     General:        Right eye: No discharge.        Left eye: No discharge.     Conjunctiva/sclera: Conjunctivae normal.     Pupils: Pupils are equal, round, and reactive to light.  Neck:     Thyroid: No thyromegaly.     Vascular: No JVD.  Cardiovascular:     Rate and Rhythm: Normal rate and regular rhythm.     Heart sounds: Normal heart sounds. No murmur heard.    No friction rub. No gallop.  Pulmonary:     Effort: Pulmonary effort is normal.     Breath sounds: Normal breath sounds.  Abdominal:     General: Bowel sounds are normal.     Palpations: Abdomen is soft. There is no mass.     Tenderness: There is no abdominal tenderness. There is no guarding.  Musculoskeletal:        General: Normal range of motion.     Cervical back: Normal range of motion and neck supple.     Lumbar back: Spasms present.  Lymphadenopathy:     Cervical: No cervical adenopathy.  Skin:    General: Skin is warm and dry.  Neurological:     Mental Status: She is alert.     Deep Tendon Reflexes: Reflexes are normal and symmetric.     Wt Readings from Last 3 Encounters:  05/03/23 151 lb (68.5 kg)  01/22/23 152 lb (68.9 kg)  11/27/22 151 lb (68.5 kg)    BP 100/70   Pulse 77   Ht 4\' 11"  (1.499 m)   Wt 151 lb (68.5 kg)   SpO2 97%   BMI 30.50 kg/m    Assessment and Plan:  1. Familial hypercholesterolemia Chronic.  Controlled.  Stable.  Reluctantly patient has agreed to take her rosuvastatin 5 mg 3 times a week which she is doing well with occasional myalgias but for the most part is tolerating.  Will check lipid panel for current level of control and hepatic panel to rule out issues with hepatic function. - rosuvastatin (CRESTOR) 5 MG tablet; TAKE 1 TABLET (5 MG TOTAL) BY MOUTH 3 times A WEEK ON TUESDAY AND THURSDAY and Saturday - Lipid Panel With LDL/HDL Ratio - Hepatic function panel  2. Gastroesophageal reflux disease Chronic.  Controlled.  Stable.  Patient is having breakthrough particularly if she eats late with certain foods.  I have suggested at least initially to use some famotidine 40 mg on the evenings that she may have a late dinner or something she is anticipating some issues with that she has eaten.  Should this continue we will make referral to gastroenterology for evaluation possibly to increase pantoprazole to twice a day or if necessary evaluate for peptic ulcer. - pantoprazole (PROTONIX) 40 MG tablet; Take 1 tablet (40 mg total) by mouth daily. - famotidine (PEPCID) 40 MG tablet; Take 1 tablet (40 mg total) by mouth daily.  Dispense: 30 tablet; Refill: 11  3. Lumbar strain, initial encounter New onset.  Patient [place] the deeper [place] when she was released after her surgery.  [place] the deeper [place] for water aerobics and utilizing different muscles which may have contributed to the development of her lumbar strain.  In addition to resuming her meloxicam she will be given some cyclobenzaprine 10 mg to be taken primarily at night  for spasms and has been told to do note lawnmowing for the next 2 weeks and to allow the back to rest and not be pounded either by riding lawnmowers or watching too much Olympics in her over chair. - cyclobenzaprine (FLEXERIL) 10 MG tablet; Take 1 tablet (10 mg total) by mouth 3 (three) times daily as  needed for muscle spasms.  Dispense: 30 tablet; Refill: 0    Elizabeth Sauer, MD

## 2023-05-04 DIAGNOSIS — E7801 Familial hypercholesterolemia: Secondary | ICD-10-CM | POA: Diagnosis not present

## 2023-05-05 NOTE — Progress Notes (Signed)
Pt wants to know if she can get prednisone. She stated the meloxicam is causing her to feel really drowsy.  KP

## 2023-05-06 ENCOUNTER — Ambulatory Visit: Payer: Medicare Other | Admitting: Family Medicine

## 2023-05-26 DIAGNOSIS — K080 Exfoliation of teeth due to systemic causes: Secondary | ICD-10-CM | POA: Diagnosis not present

## 2023-06-07 DIAGNOSIS — H524 Presbyopia: Secondary | ICD-10-CM | POA: Diagnosis not present

## 2023-07-14 ENCOUNTER — Other Ambulatory Visit: Payer: Self-pay | Admitting: Family Medicine

## 2023-07-14 DIAGNOSIS — Z1231 Encounter for screening mammogram for malignant neoplasm of breast: Secondary | ICD-10-CM

## 2023-08-09 ENCOUNTER — Ambulatory Visit
Admission: RE | Admit: 2023-08-09 | Discharge: 2023-08-09 | Disposition: A | Discharge status: Home / Self Care | Payer: Medicare Other | Source: Ambulatory Visit | Attending: Family Medicine | Admitting: Family Medicine

## 2023-08-09 DIAGNOSIS — Z1231 Encounter for screening mammogram for malignant neoplasm of breast: Secondary | ICD-10-CM | POA: Insufficient documentation

## 2023-10-02 ENCOUNTER — Other Ambulatory Visit: Payer: Self-pay | Admitting: Family Medicine

## 2023-10-02 DIAGNOSIS — E7801 Familial hypercholesterolemia: Secondary | ICD-10-CM

## 2023-10-05 ENCOUNTER — Ambulatory Visit (INDEPENDENT_AMBULATORY_CARE_PROVIDER_SITE_OTHER): Payer: Medicare Other | Admitting: Family Medicine

## 2023-10-05 ENCOUNTER — Other Ambulatory Visit: Payer: Self-pay | Admitting: Family Medicine

## 2023-10-05 VITALS — BP 127/77 | HR 87 | Ht 59.0 in | Wt 151.0 lb

## 2023-10-05 DIAGNOSIS — L719 Rosacea, unspecified: Secondary | ICD-10-CM | POA: Diagnosis not present

## 2023-10-05 DIAGNOSIS — R1319 Other dysphagia: Secondary | ICD-10-CM | POA: Diagnosis not present

## 2023-10-05 DIAGNOSIS — K2100 Gastro-esophageal reflux disease with esophagitis, without bleeding: Secondary | ICD-10-CM | POA: Diagnosis not present

## 2023-10-05 MED ORDER — METRONIDAZOLE 1 % EX GEL: Freq: Every day | CUTANEOUS | 0 refills | Status: DC

## 2023-10-05 MED ORDER — PANTOPRAZOLE SODIUM 40 MG PO TBEC: 40.0000 mg | DELAYED_RELEASE_TABLET | Freq: Two times a day (BID) | ORAL | 0 refills | Status: DC

## 2023-10-05 NOTE — Progress Notes (Signed)
 Date:  10/05/2023   Name:  Jennifer Kirby   DOB:  Oct 27, 1955   MRN:  [id number]   Chief Complaint: Rash (Pt has a rash under eye near nose on right side of face. Pt states she has had it for a month. She said its itching. She also states she's had indigestion for a while.)  Rash This is a new problem. The current episode started in the past 7 days. The problem is unchanged. The affected locations include the face. The rash is characterized by redness, scaling and itchiness. She was exposed to nothing. Pertinent negatives include no anorexia, congestion, cough, diarrhea, eye pain, facial edema, fatigue, fever, joint pain, nail changes, rhinorrhea, shortness of breath, sore throat or vomiting. Past treatments include nothing. The treatment provided mild relief.  Gastroesophageal Reflux She complains of dysphagia, globus sensation and heartburn. She reports no abdominal pain, no chest pain, no choking, no coughing, no nausea, no sore throat or no wheezing. The current episode started 1 to 4 weeks ago. The problem occurs frequently. The problem has been waxing and waning. The heartburn duration is less than a minute. The heartburn is located in the substernum. The heartburn is of mild intensity. Pertinent negatives include no anemia, fatigue, melena, muscle weakness, orthopnea or weight loss. She has tried a PPI (Omeprazole ) for the symptoms. The treatment provided mild relief.    Lab Results  Component Value Date   NA 141 11/05/2022   K 4.4 11/05/2022   CO2 23 11/05/2022   GLUCOSE 95 11/05/2022   BUN 11 11/05/2022   CREATININE 0.57 11/05/2022   CALCIUM  9.3 11/05/2022   EGFR 99 11/05/2022   GFRNONAA >60 08/25/2022   Lab Results  Component Value Date   CHOL 222 (H) 05/04/2023   HDL 79 05/04/2023   LDLCALC 130 (H) 05/04/2023   TRIG 77 05/04/2023   CHOLHDL 3.5 07/28/2017   No results found for: TSH No results found for: HGBA1C Lab Results  Component Value Date   WBC 6.4  08/25/2022   HGB 13.8 08/25/2022   HCT 40.3 08/25/2022   MCV 86.1 08/25/2022   PLT 283 08/25/2022   Lab Results  Component Value Date   ALT 13 05/04/2023   AST 19 05/04/2023   ALKPHOS 68 05/04/2023   BILITOT 0.3 05/04/2023   No results found for: 25OHVITD2, 25OHVITD3, VD25OH   Review of Systems  Constitutional:  Negative for fatigue, fever and weight loss.  HENT:  Negative for congestion, rhinorrhea and sore throat.   Eyes:  Negative for pain.  Respiratory:  Negative for cough, choking, shortness of breath and wheezing.   Cardiovascular:  Negative for chest pain, palpitations and leg swelling.  Gastrointestinal:  Positive for dysphagia and heartburn. Negative for abdominal pain, anal bleeding, anorexia, blood in stool, constipation, diarrhea, melena, nausea and vomiting.  Musculoskeletal:  Negative for joint pain and muscle weakness.  Skin:  Positive for rash. Negative for nail changes.    Patient Active Problem List   Diagnosis Date Noted   Seasonal allergic rhinitis due to pollen 07/25/2021   Gastroesophageal reflux disease 07/25/2021   Traumatic partial tear of left biceps tendon 05/26/2019   Traumatic complete tear of left rotator cuff 05/26/2019   Synovitis of left shoulder 05/26/2019   Subacromial impingement, left 05/26/2019   Obesity (BMI 30-39.9) 11/25/2018   Contact dermatitis 02/15/2018   Mass of skin of right shoulder 07/27/2016   Brachial neuritis 03/08/2015   Encounter for general adult medical examination without  abnormal findings 03/08/2015   Herpes zoster 03/08/2015   H/O hypercholesterolemia 03/08/2015   HLD (hyperlipidemia) 03/08/2015    Allergies  Allergen Reactions   Statins     Makes me hurt    Zetia  [Ezetimibe ] Other (See Comments)    Gave leg cramps    Past Surgical History:  Procedure Laterality Date   COLONOSCOPY  11/12/2017   FOOT FRACTURE SURGERY Left    PLANTAR FASCIECTOMY Left 2008   ROTATOR CUFF REPAIR Left 05/26/2019    bicep tear, tendon repair and skin graft - [facility]    SHOULDER ARTHROSCOPY Right 08/27/2022   Procedure: Right shoulder arthroscopic versus mini open subscapularis and supraspinatus repair, subacromial decompression, distal clavicle excision, and biceps tenodesis;  Surgeon: Tobie Priest, MD;  Location: [facility];  Service: Orthopedics;  Laterality: Right;    Social History   Tobacco Use   Smoking status: Never   Smokeless tobacco: Never  Vaping Use   Vaping status: Never Used  Substance Use Topics   Alcohol use: Not Currently   Drug use: No     Medication list has been reviewed and updated.  Current Meds  Medication Sig   calcium  carbonate (TUMS - DOSED IN MG ELEMENTAL CALCIUM ) 500 MG chewable tablet Chew 1 tablet by mouth daily as needed.   cyclobenzaprine  (FLEXERIL ) 10 MG tablet Take 1 tablet (10 mg total) by mouth 3 (three) times daily as needed for muscle spasms.   metroNIDAZOLE  (METROCREAM ) 0.75 % cream Apply topically 2 (two) times daily.   montelukast  (SINGULAIR ) 10 MG tablet Take 1 tablet (10 mg total) by mouth as needed.   pantoprazole  (PROTONIX ) 40 MG tablet Take 1 tablet (40 mg total) by mouth daily.   rosuvastatin  (CRESTOR ) 5 MG tablet TAKE 1 TABLET (5 MG TOTAL) BY MOUTH 2 (TWO) TIMES A WEEK ON TUESDAY AND THURSDAY   Sennosides-Docusate Sodium  (SENNA PLUS) 8.6-50 MG CAPS Take 1 capsule by mouth daily.       10/05/2023    4:42 PM 05/03/2023   11:08 AM 01/22/2023    3:02 PM 11/27/2022    9:13 AM  GAD 7 : Generalized Anxiety Score  Nervous, Anxious, on Edge 0 0 0 0  Control/stop worrying 0 0 0 0  Worry too much - different things 0 0 0 0  Trouble relaxing 0 0 0 0  Restless 0 0 0 0  Easily annoyed or irritable 0 0 0 0  Afraid - awful might happen 0 0 0 0  Total GAD 7 Score 0 0 0 0  Anxiety Difficulty Not difficult at all Not difficult at all Not difficult at all Not difficult at all       10/05/2023    4:41 PM 05/03/2023   11:08 AM 01/22/2023    3:02 PM  Depression  screen PHQ 2/9  Decreased Interest 0 0 0  Down, Depressed, Hopeless 0 0 0  PHQ - 2 Score 0 0 0  Altered sleeping 3 0 0  Tired, decreased energy 1 0 0  Change in appetite 2 0 0  Feeling bad or failure about yourself  0 0 0  Trouble concentrating 0 0 0  Moving slowly or fidgety/restless 0 0 0  Suicidal thoughts 0 0 0  PHQ-9 Score 6 0 0  Difficult doing work/chores Somewhat difficult Not difficult at all Not difficult at all    BP Readings from Last 3 Encounters:  10/05/23 127/77  05/03/23 100/70  01/22/23 120/72    Physical Exam Vitals and  nursing note reviewed.  HENT:     Right Ear: Tympanic membrane, ear canal and external ear normal. There is no impacted cerumen.     Left Ear: Tympanic membrane, ear canal and external ear normal. There is no impacted cerumen.     Nose: Nose normal. No congestion or rhinorrhea.     Mouth/Throat:     Mouth: Mucous membranes are moist.     Pharynx: No oropharyngeal exudate or posterior oropharyngeal erythema.  Eyes:     Pupils: Pupils are equal, round, and reactive to light.  Cardiovascular:     Heart sounds: No murmur heard.    No friction rub. No gallop.  Pulmonary:     Breath sounds: No wheezing, rhonchi or rales.     Wt Readings from Last 3 Encounters:  10/05/23 151 lb (68.5 kg)  05/03/23 151 lb (68.5 kg)  01/22/23 152 lb (68.9 kg)    BP 127/77   Pulse 87   Ht 4' 11 (1.499 m)   Wt 151 lb (68.5 kg)   SpO2 96%   BMI 30.50 kg/m   Assessment and Plan:  1. Esophageal dysphagia (Primary) New onset.  There is no food that is actually sticking but there is a sensation as is going down which brings up the probability of esophagitis.  Patient is on omeprazole  but we are going to switch to pantoprazole  and go ahead and ramp up to twice a day with the anticipation that we can get in with Dr. Aundria or her G eye doctor to further take a look at this and perhaps consider endoscopy given that patient is having some issues that is  borderline dysphagia. - pantoprazole  (PROTONIX ) 40 MG tablet; Take 1 tablet (40 mg total) by mouth 2 (two) times daily.  Dispense: 60 tablet; Refill: 0 - Ambulatory referral to Gastroenterology  2. Gastroesophageal reflux disease with esophagitis without hemorrhage As noted above. - pantoprazole  (PROTONIX ) 40 MG tablet; Take 1 tablet (40 mg total) by mouth 2 (two) times daily.  Dispense: 60 tablet; Refill: 0 - Ambulatory referral to Gastroenterology  3. Rosacea Patient also has a rash on the face I think is followed by dermatology in [place].  Currently is using a cream form of metronidazole  and I will switch over to MetroGel  1% apply topically daily.  Also to the forehead that I think there is some involvement as well patient's been encouraged to call her cardiologist and to see if there is any next step progression from the metronidazole . - metroNIDAZOLE  (METROGEL ) 1 % gel; Apply topically daily.  Dispense: 45 g; Refill: 0    Cathryne Molt, MD

## 2023-10-06 NOTE — Telephone Encounter (Signed)
 Requested medication (s) are due for refill today - no  Requested medication (s) are on the active medication list - yes  Future visit scheduled -yes  Last refill: 10/05/23  Notes to clinic:   Pharmacy comment: Alternative Requested:THE PRESCRIBED MEDICATION IS NOT COVERED BY INSURANCE. PLEASE CONSIDER CHANGING TO ONE OF THE SUGGESTED COVERED ALTERNATIVES.    Requested Prescriptions  Pending Prescriptions Disp Refills   metroNIDAZOLE  (METROCREAM ) 0.75 % cream [Pharmacy Med Name: METRONIDAZOLE  0.75% CREAM]  0     Off-Protocol Failed - 10/06/2023  8:38 AM      Failed - Medication not assigned to a protocol, review manually.      Passed - Valid encounter within last 12 months    Recent Outpatient Visits           Yesterday Esophageal dysphagia   [place] [facility] Joshua Cathryne JAYSON, MD   5 months ago Familial hypercholesterolemia   [place] [facility] Joshua Cathryne JAYSON, MD   8 months ago Costochondritis   [place] [facility] Joshua Cathryne JAYSON, MD   10 months ago Hand disorder   [place] [facility] Cathryne JAYSON, MD   11 months ago Seasonal allergic rhinitis due to pollen   [facility] Cathryne JAYSON, MD       Future Appointments             In 4 weeks Joshua Cathryne JAYSON, MD [facility] at [facility], [facility]               Requested Prescriptions  Pending Prescriptions Disp Refills   metroNIDAZOLE  (METROCREAM ) 0.75 % cream [Pharmacy Med Name: METRONIDAZOLE  0.75% CREAM]  0     Off-Protocol Failed - 10/06/2023  8:38 AM      Failed - Medication not assigned to a protocol, review manually.      Passed - Valid encounter within last 12 months    Recent Outpatient Visits           Yesterday Esophageal dysphagia   [facility] Joshua Cathryne JAYSON, MD   5 months ago Familial hypercholesterolemia   [place] [facility] Cathryne JAYSON, MD   8 months ago Costochondritis    [facility] at [facility] Joshua Cathryne JAYSON, MD   10 months ago Hand disorder   [facility] Joshua Cathryne JAYSON, MD   11 months ago Seasonal allergic rhinitis due to pollen   [facility] at [facility], MD       Future Appointments             In 4 weeks Joshua Cathryne JAYSON, MD [facility] at [facility], [facility]

## 2023-10-18 ENCOUNTER — Ambulatory Visit: Payer: Medicare Other | Admitting: Dermatology

## 2023-10-18 DIAGNOSIS — L719 Rosacea, unspecified: Secondary | ICD-10-CM | POA: Diagnosis not present

## 2023-10-18 DIAGNOSIS — L219 Seborrheic dermatitis, unspecified: Secondary | ICD-10-CM

## 2023-10-18 DIAGNOSIS — L309 Dermatitis, unspecified: Secondary | ICD-10-CM

## 2023-10-18 MED ORDER — METRONIDAZOLE 0.75 % EX CREA: TOPICAL_CREAM | Freq: Two times a day (BID) | CUTANEOUS | 6 refills | Status: DC

## 2023-10-18 MED ORDER — TACROLIMUS 0.1 % EX OINT: TOPICAL_OINTMENT | Freq: Two times a day (BID) | CUTANEOUS | 2 refills | Status: AC

## 2023-10-18 NOTE — Progress Notes (Signed)
   Follow-Up Visit   Subjective  Jennifer Kirby is a 68 y.o. female who presents for the following: rash on bilateral cheeks & forehead x couple of weeks, itching. Pt states she does have rosacea. Pt states she was typically uses metronidazole 0.75% for rosacea but her PCP switched her to MetroGel 1%. Pt has been applying that for x1 week. Pt states she was on sulfur but has affected her indigestion unsure if that affected.    The following portions of the chart were reviewed this encounter and updated as appropriate: medications, allergies, medical history  Review of Systems:  No other skin or systemic complaints except as noted in HPI or Assessment and Plan.  Objective  Well appearing patient in no apparent distress; mood and affect are within normal limits.    A focused examination was performed of the following areas: Face  Relevant exam findings are noted in the Assessment and Plan.    Assessment & Plan   ROSACEA Exam Mid face erythema with telangiectasias malar cheeks.  Chronic and persistent condition with duration or expected duration over one year. Condition is symptomatic / bothersome to patient. Not to goal.  Rosacea is a chronic progressive skin condition usually affecting the face of adults, causing redness and/or acne bumps. It is treatable but not curable. It sometimes affects the eyes (ocular rosacea) as well. It may respond to topical and/or systemic medication and can flare with stress, sun exposure, alcohol, exercise, topical steroids (including hydrocortisone/cortisone 10) and some foods.  Daily application of broad spectrum spf 30+ sunscreen to face is recommended to reduce flares.   Treatment Plan restart metrocream 0.75% apply on nose, cheeks aa, discontinue 1% metrogel since it is drying out her skin. May use metrogel during summer.     DERMATITIS- Seborrheic vrs atopic Exam: Pink scalp patches at forehead, glabella, paranasal, R temple hairline,  eyelids.  Chronic and persistent condition with duration or expected duration over one year. Condition is bothersome/symptomatic for patient. Currently flared.   Seborrheic Dermatitis is a chronic persistent rash characterized by pinkness and scaling most commonly of the mid face but also can occur on the scalp (dandruff), ears; mid chest, mid back and groin.  It tends to be exacerbated by stress and cooler weather.  People who have neurologic disease may experience new onset or exacerbation of existing seborrheic dermatitis.  The condition is not curable but treatable and can be controlled.  Treatment Plan: Start tacrolimus 0.1% ointment, can apply to aa face and around eyelids BID, apply rosacea cream first. RTC if not improved after 4 weeks of use.  Recommend mild soap and moisturizing cream 1-2 times daily.  Gentle skin care handout provided. Samples given to patient.   Return for As scheduled, w/ Dr. Roseanne Reno.  I, Soundra Pilon, CMA, am acting as scribe for Willeen Niece, MD .   Documentation: I have reviewed the above documentation for accuracy and completeness, and I agree with the above.  Willeen Niece, MD

## 2023-10-18 NOTE — Patient Instructions (Addendum)
Recommend mild soap and moisturizing cream 1-2 times daily.  Gentle skin care handout provided.    Due to recent changes in healthcare laws, you may see results of your pathology and/or laboratory studies on MyChart before the doctors have had a chance to review them. We understand that in some cases there may be results that are confusing or concerning to you. Please understand that not all results are received at the same time and often the doctors may need to interpret multiple results in order to provide you with the best plan of care or course of treatment. Therefore, we ask that you please give [place] 2 business days to thoroughly review all your results before contacting the office for clarification. Should we see a critical lab result, you will be contacted sooner.   If You Need Anything After Your Visit  If you have any questions or concerns for your doctor, please call our main line at [phone] and press option 4 to reach your doctor's medical assistant. If no one answers, please leave a voicemail as directed and we will return your call as soon as possible. Messages left after 4 pm will be answered the following business day.   You may also send [place] a message via MyChart. We typically respond to MyChart messages within 1-2 business days.  For prescription refills, please ask your pharmacy to contact our office. Our fax number is [phone].  If you have an urgent issue when the clinic is closed that cannot wait until the next business day, you can page your doctor at the number below.    Please note that while we do our best to be available for urgent issues outside of office hours, we are not available 24/7.   If you have an urgent issue and are unable to reach [place], you may choose to seek medical care at your doctor's office, retail clinic, urgent care center, or emergency room.  If you have a medical emergency, please immediately call 911 or go to the emergency department.  Pager  Numbers  - Dr. Gwen Pounds: [phone]  - Dr. Roseanne Reno: [phone]  - Dr. Katrinka Blazing: [phone]   In the event of inclement weather, please call our main line at [phone] for an update on the status of any delays or closures.  Dermatology Medication Tips: Please keep the boxes that topical medications come in in order to help keep track of the instructions about where and how to use these. Pharmacies typically print the medication instructions only on the boxes and not directly on the medication tubes.   If your medication is too expensive, please contact our office at [phone] option 4 or send [place] a message through MyChart.   We are unable to tell what your co-pay for medications will be in advance as this is different depending on your insurance coverage. However, we may be able to find a substitute medication at lower cost or fill out paperwork to get insurance to cover a needed medication.   If a prior authorization is required to get your medication covered by your insurance company, please allow [place] 1-2 business days to complete this process.  Drug prices often vary depending on where the prescription is filled and some pharmacies may offer cheaper prices.  The website www.goodrx.com contains coupons for medications through different pharmacies. The prices here do not account for what the cost may be with help from insurance (it may be cheaper with your insurance), but the website can give you the price  if you did not use any insurance.  - You can print the associated coupon and take it with your prescription to the pharmacy.  - You may also stop by our office during regular business hours and pick up a GoodRx coupon card.  - If you need your prescription sent electronically to a different pharmacy, notify our office through [facility] or by phone at [phone] option 4.     Si Usted Necesita Algo Despus de Su Visita  Tambin puede enviarnos un mensaje a travs de  Clinical cytogeneticist. Por lo general respondemos a los mensajes de MyChart en el transcurso de 1 a 2 das hbiles.  Para renovar recetas, por favor pida a su farmacia que se ponga en contacto con nuestra oficina. Annie Sable de fax es [place] [phone].  Si tiene un asunto urgente cuando la clnica est cerrada y que no puede esperar hasta el siguiente da hbil, puede llamar/localizar a su doctor(a) al nmero que aparece a continuacin.   Por favor, tenga en cuenta que aunque hacemos todo lo posible para estar disponibles para asuntos urgentes fuera del horario de [place], no estamos disponibles las 24 horas del da, los 7 [address]  [address] de la [place].   Si tiene un problema urgente y no puede comunicarse con nosotros, puede optar por buscar atencin mdica  en el consultorio de su doctor(a), en una clnica privada, en un centro de atencin urgente o en una sala de emergencias.  Si tiene Engineer, drilling, por favor llame inmediatamente al 911 o vaya a la sala de emergencias.  Nmeros de bper  - Dr. Gwen Pounds: [phone]  - Dra. Roseanne Reno: [phone]  - Dr. Katrinka Blazing: [phone]   En caso de inclemencias del tiempo, por favor llame a Lacy Duverney principal al [phone] para una actualizacin sobre el [place] de cualquier retraso o cierre.  Consejos para la medicacin en dermatologa: Por favor, guarde las cajas en las que vienen los medicamentos de uso tpico para ayudarle a seguir las instrucciones sobre dnde y cmo usarlos. Las farmacias generalmente imprimen las instrucciones del medicamento slo en las cajas y no directamente en los tubos del [place].   Si su medicamento es muy caro, por favor, pngase en contacto con Rolm Gala llamando al [phone] y presione la opcin 4 o envenos un mensaje a travs de Clinical cytogeneticist.   No podemos decirle cul ser su copago por los medicamentos por adelantado ya que esto es diferente dependiendo de la cobertura de su seguro. Sin embargo, es posible que  podamos encontrar un medicamento sustituto a Audiological scientist un formulario para que el seguro cubra el medicamento que se considera necesario.   Si se requiere una autorizacin previa para que su compaa de seguros [place] su medicamento, por favor permtanos de 1 a 2 das hbiles para completar [address]  Los precios de los medicamentos varan con frecuencia dependiendo del Environmental consultant de dnde se surte la receta y alguna farmacias pueden ofrecer precios ms baratos.  El sitio web www.goodrx.com tiene cupones para medicamentos de Health and safety inspector. Los precios aqu no tienen en cuenta lo que podra costar con la ayuda del seguro (puede ser ms barato con su seguro), pero el sitio web puede darle el precio si no utiliz Tourist information centre manager.  - Puede imprimir el cupn correspondiente y llevarlo con su receta a la farmacia.  - Tambin puede pasar por nuestra oficina durante el horario de atencin regular y Education officer, museum una tarjeta de cupones de GoodRx.  - Si necesita que  su receta se enve electrnicamente a una farmacia diferente, informe a nuestra oficina a travs de MyChart de [place] o por telfono llamando al [phone] y presione la opcin 4.

## 2023-10-25 ENCOUNTER — Telehealth: Payer: Self-pay

## 2023-10-25 NOTE — Telephone Encounter (Signed)
Tacrolimus 0.1% ointment denied by patient's insurance because it is not FDA approved for seborrheic dermatitis. Please advise.

## 2023-10-28 ENCOUNTER — Other Ambulatory Visit: Payer: Self-pay | Admitting: Internal Medicine

## 2023-10-28 DIAGNOSIS — R0789 Other chest pain: Secondary | ICD-10-CM | POA: Diagnosis not present

## 2023-10-28 DIAGNOSIS — R1314 Dysphagia, pharyngoesophageal phase: Secondary | ICD-10-CM

## 2023-10-28 DIAGNOSIS — K219 Gastro-esophageal reflux disease without esophagitis: Secondary | ICD-10-CM | POA: Diagnosis not present

## 2023-11-04 ENCOUNTER — Encounter: Payer: Self-pay | Admitting: Dermatology

## 2023-11-04 ENCOUNTER — Ambulatory Visit: Payer: Self-pay | Admitting: Family Medicine

## 2023-11-04 ENCOUNTER — Ambulatory Visit: Payer: Medicare Other | Admitting: Dermatology

## 2023-11-04 DIAGNOSIS — L719 Rosacea, unspecified: Secondary | ICD-10-CM | POA: Diagnosis not present

## 2023-11-04 MED ORDER — DOXYCYCLINE HYCLATE 50 MG PO CAPS: 50.0000 mg | ORAL_CAPSULE | Freq: Two times a day (BID) | ORAL | 0 refills | Status: DC

## 2023-11-04 NOTE — Progress Notes (Signed)
   Follow-Up Visit   Subjective  Jennifer Kirby is a 68 y.o. female who presents for the following: rash at face that is not any better and has broke out over the last 2 days. Patient saw Dr. Jackquline 1/20 and was diagnosed with rosacea and seb derm vs atopic derm. Patient washing with CeraVe followed with tacrolimus  and CeraVe moisturizer.    The following portions of the chart were reviewed this encounter and updated as appropriate: medications, allergies, medical history  Review of Systems:  No other skin or systemic complaints except as noted in HPI or Assessment and Plan.  Objective  Well appearing patient in no apparent distress; mood and affect are within normal limits.   A focused examination was performed of the following areas: face  Relevant exam findings are noted in the Assessment and Plan.    Assessment & Plan       ROSACEA Exam Mid face erythema with telangiectasias, scattered inflammatory papules and pustules on cheeks  Chronic and persistent condition with duration or expected duration over one year. Condition is bothersome/symptomatic for patient. Currently flared.   Rosacea is a chronic progressive skin condition usually affecting the face of adults, causing redness and/or acne bumps. It is treatable but not curable. It sometimes affects the eyes (ocular rosacea) as well. It may respond to topical and/or systemic medication and can flare with stress, sun exposure, alcohol, exercise, topical steroids (including hydrocortisone /cortisone 10) and some foods.  Daily application of broad spectrum spf 30+ sunscreen to face is recommended to reduce flares.   Treatment Plan Start doxycycline  50 mg BID with food.  Stop tacrolimus  restart metronidazole  0.75% cr 1-2 times daily.   Doxycycline  should be taken with food to prevent nausea. Do not lay down for 30 minutes after taking. Be cautious with sun exposure and use good sun protection while on this  medication. Pregnant women should not take this medication.      ROSACEA   Related Medications doxycycline  (VIBRAMYCIN ) 50 MG capsule Take 1 capsule (50 mg total) by mouth 2 (two) times daily. Take with food  Return for as scheduled, with Dr. Jackquline, TBSE.  LILLETTE Lonell Drones, RMA, am acting as scribe for Boneta Sharps, MD .   Documentation: I have reviewed the above documentation for accuracy and completeness, and I agree with the above.  Boneta Sharps, MD

## 2023-11-04 NOTE — Patient Instructions (Addendum)
 Treatment Plan Start doxycycline  50 mg BID with food.  Stop tacrolimus  Start metronidazole  0.75% cr 1-2 times daily.   Doxycycline  should be taken with food to prevent nausea. Do not lay down for 30 minutes after taking. Be cautious with sun exposure and use good sun protection while on this medication. Pregnant women should not take this medication.  Due to recent changes in healthcare laws, you may see results of your pathology and/or laboratory studies on MyChart before the doctors have had a chance to review them. We understand that in some cases there may be results that are confusing or concerning to you. Please understand that not all results are received at the same time and often the doctors may need to interpret multiple results in order to provide you with the best plan of care or course of treatment. Therefore, we ask that you please give us  2 business days to thoroughly review all your results before contacting the office for clarification. Should we see a critical lab result, you will be contacted sooner.   If You Need Anything After Your Visit  If you have any questions or concerns for your doctor, please call our main line at [phone] and press option 4 to reach your doctor's medical assistant. If no one answers, please leave a voicemail as directed and we will return your call as soon as possible. Messages left after 4 pm will be answered the following business day.   You may also send us  a message via MyChart. We typically respond to MyChart messages within 1-2 business days.  For prescription refills, please ask your pharmacy to contact our office. Our fax number is [phone].  If you have an urgent issue when the clinic is closed that cannot wait until the next business day, you can page your doctor at the number below.    Please note that while we do our best to be available for urgent issues outside of office hours, we are not available 24/7.   If you have an urgent  issue and are unable to reach us , you may choose to seek medical care at your doctor's office, retail clinic, urgent care center, or emergency room.  If you have a medical emergency, please immediately call 911 or go to the emergency department.  Pager Numbers  - Dr. Hester: [phone]  - Dr. Jackquline: [phone]  - Dr. Claudene: [phone]   In the event of inclement weather, please call our main line at [phone] for an update on the status of any delays or closures.  Dermatology Medication Tips: Please keep the boxes that topical medications come in in order to help keep track of the instructions about where and how to use these. Pharmacies typically print the medication instructions only on the boxes and not directly on the medication tubes.   If your medication is too expensive, please contact our office at [phone] option 4 or send us  a message through MyChart.   We are unable to tell what your co-pay for medications will be in advance as this is different depending on your insurance coverage. However, we may be able to find a substitute medication at lower cost or fill out paperwork to get insurance to cover a needed medication.   If a prior authorization is required to get your medication covered by your insurance company, please allow us  1-2 business days to complete this process.  Drug prices often vary depending on where the prescription is filled and some pharmacies may offer cheaper  prices.  The website www.goodrx.com contains coupons for medications through different pharmacies. The prices here do not account for what the cost may be with help from insurance (it may be cheaper with your insurance), but the website can give you the price if you did not use any insurance.  - You can print the associated coupon and take it with your prescription to the pharmacy.  - You may also stop by our office during regular business hours and pick up a GoodRx coupon card.  - If  you need your prescription sent electronically to a different pharmacy, notify our office through [facility] or by phone at [phone] option 4.     Si Usted Necesita Algo Despus de Su Visita  Tambin puede enviarnos un mensaje a travs de Clinical Cytogeneticist. Por lo general respondemos a los mensajes de MyChart en el transcurso de 1 a 2 das hbiles.  Para renovar recetas, por favor pida a su farmacia que se ponga en contacto con nuestra oficina. Randi lakes de fax es [place] [phone].  Si tiene un asunto urgente cuando la clnica est cerrada y que no puede esperar hasta el siguiente da hbil, puede llamar/localizar a su doctor(a) al nmero que aparece a continuacin.   Por favor, tenga en cuenta que aunque hacemos todo lo posible para estar disponibles para asuntos urgentes fuera del horario de [place], no estamos disponibles las 24 horas del da, los 7 [address]  [address] de la [place].   Si tiene un problema urgente y no puede comunicarse con nosotros, puede optar por buscar atencin mdica  en el consultorio de su doctor(a), en una clnica privada, en un centro de atencin urgente o en una sala de emergencias.  Si tiene engineer, drilling, por favor llame inmediatamente al 911 o vaya a la sala de emergencias.  Nmeros de bper  - Dr. Hester: [phone]  - Dra. Jackquline: [phone]  - Dr. Claudene: [phone]   En caso de inclemencias del tiempo, por favor llame a landry capes principal al [phone] para una actualizacin sobre el [place] de cualquier retraso o cierre.  Consejos para la medicacin en dermatologa: Por favor, guarde las cajas en las que vienen los medicamentos de uso tpico para ayudarle a seguir las instrucciones sobre dnde y cmo usarlos. Las farmacias generalmente imprimen las instrucciones del medicamento slo en las cajas y no directamente en los tubos del [place].   Si su medicamento es muy caro, por favor, pngase en contacto con landry rieger llamando  al [phone] y presione la opcin 4 o envenos un mensaje a travs de Clinical Cytogeneticist.   No podemos decirle cul ser su copago por los medicamentos por adelantado ya que esto es diferente dependiendo de la cobertura de su seguro. Sin embargo, es posible que podamos encontrar un medicamento sustituto a audiological scientist un formulario para que el seguro cubra el medicamento que se considera necesario.   Si se requiere una autorizacin previa para que su compaa de seguros cubra su medicamento, por favor permtanos de 1 a 2 das hbiles para completar este proceso.  Los precios de los medicamentos varan con frecuencia dependiendo del environmental consultant de dnde se surte la receta y alguna farmacias pueden ofrecer precios ms baratos.  El sitio web www.goodrx.com tiene cupones para medicamentos de health and safety inspector. Los precios aqu no tienen en cuenta lo que podra costar con la ayuda del seguro (puede ser ms barato con su seguro), pero el sitio web puede darle el precio si no utiliz ningn  seguro.  - Puede imprimir el cupn correspondiente y llevarlo con su receta a la farmacia.  - Tambin puede pasar por nuestra oficina durante el horario de atencin regular y education officer, museum una tarjeta de cupones de GoodRx.  - Si necesita que su receta se enve electrnicamente a una farmacia diferente, informe a nuestra oficina a travs de MyChart de [place] o por telfono llamando al [phone] y presione la opcin 4.

## 2023-11-11 ENCOUNTER — Ambulatory Visit
Admission: RE | Admit: 2023-11-11 | Discharge: 2023-11-11 | Disposition: A | Discharge status: Home / Self Care | Payer: Medicare Other | Source: Ambulatory Visit | Attending: Internal Medicine | Admitting: Internal Medicine

## 2023-11-11 DIAGNOSIS — R1314 Dysphagia, pharyngoesophageal phase: Secondary | ICD-10-CM | POA: Insufficient documentation

## 2023-11-11 DIAGNOSIS — K449 Diaphragmatic hernia without obstruction or gangrene: Secondary | ICD-10-CM | POA: Diagnosis not present

## 2023-11-11 DIAGNOSIS — K224 Dyskinesia of esophagus: Secondary | ICD-10-CM | POA: Diagnosis not present

## 2023-11-15 ENCOUNTER — Encounter: Payer: Self-pay | Admitting: Family Medicine

## 2023-11-22 ENCOUNTER — Encounter: Payer: Self-pay | Admitting: Family Medicine

## 2023-11-23 ENCOUNTER — Telehealth: Payer: Self-pay | Admitting: Family Medicine

## 2023-11-23 ENCOUNTER — Other Ambulatory Visit: Payer: Self-pay | Admitting: Family Medicine

## 2023-11-23 DIAGNOSIS — R1319 Other dysphagia: Secondary | ICD-10-CM

## 2023-11-23 DIAGNOSIS — K2100 Gastro-esophageal reflux disease with esophagitis, without bleeding: Secondary | ICD-10-CM

## 2023-11-23 MED ORDER — PANTOPRAZOLE SODIUM 40 MG PO TBEC: 40.0000 mg | DELAYED_RELEASE_TABLET | Freq: Two times a day (BID) | ORAL | 5 refills | Status: DC

## 2023-11-23 NOTE — Telephone Encounter (Signed)
 Sent pantoprazole 40 mg twice a day.

## 2023-11-29 ENCOUNTER — Ambulatory Visit: Payer: Medicare Other | Admitting: Dermatology

## 2023-11-29 DIAGNOSIS — W908XXA Exposure to other nonionizing radiation, initial encounter: Secondary | ICD-10-CM | POA: Diagnosis not present

## 2023-11-29 DIAGNOSIS — D2372 Other benign neoplasm of skin of left lower limb, including hip: Secondary | ICD-10-CM

## 2023-11-29 DIAGNOSIS — L578 Other skin changes due to chronic exposure to nonionizing radiation: Secondary | ICD-10-CM | POA: Diagnosis not present

## 2023-11-29 DIAGNOSIS — Z872 Personal history of diseases of the skin and subcutaneous tissue: Secondary | ICD-10-CM

## 2023-11-29 DIAGNOSIS — L720 Epidermal cyst: Secondary | ICD-10-CM

## 2023-11-29 DIAGNOSIS — Z1283 Encounter for screening for malignant neoplasm of skin: Secondary | ICD-10-CM

## 2023-11-29 DIAGNOSIS — L719 Rosacea, unspecified: Secondary | ICD-10-CM

## 2023-11-29 DIAGNOSIS — L234 Allergic contact dermatitis due to dyes: Secondary | ICD-10-CM

## 2023-11-29 DIAGNOSIS — L309 Dermatitis, unspecified: Secondary | ICD-10-CM

## 2023-11-29 DIAGNOSIS — Z7189 Other specified counseling: Secondary | ICD-10-CM

## 2023-11-29 DIAGNOSIS — L729 Follicular cyst of the skin and subcutaneous tissue, unspecified: Secondary | ICD-10-CM

## 2023-11-29 DIAGNOSIS — D1801 Hemangioma of skin and subcutaneous tissue: Secondary | ICD-10-CM

## 2023-11-29 DIAGNOSIS — L814 Other melanin hyperpigmentation: Secondary | ICD-10-CM

## 2023-11-29 DIAGNOSIS — D239 Other benign neoplasm of skin, unspecified: Secondary | ICD-10-CM

## 2023-11-29 DIAGNOSIS — L821 Other seborrheic keratosis: Secondary | ICD-10-CM

## 2023-11-29 DIAGNOSIS — D229 Melanocytic nevi, unspecified: Secondary | ICD-10-CM

## 2023-11-29 MED ORDER — CLOBETASOL PROPIONATE 0.05 % EX SHAM: MEDICATED_SHAMPOO | CUTANEOUS | 3 refills | Status: DC

## 2023-11-29 MED ORDER — DOXYCYCLINE HYCLATE 50 MG PO CAPS: 50.0000 mg | ORAL_CAPSULE | Freq: Two times a day (BID) | ORAL | 0 refills | Status: DC

## 2023-11-29 NOTE — Patient Instructions (Addendum)
 Allergic Contact Dermatitis to Hair Dye Start Clobetasol shampoo - apply to dry scalp and let sit 20 minutes before rinsing. Use daily until symptoms improved.  Start Tacrolimus ointment twice daily to itchy areas on face, neck.    Rosacea  What is rosacea? Rosacea (say: ro-zay-sha) is a common skin disease that usually begins as a trend of flushing or blushing easily.  As rosacea progresses, a persistent redness in the center of the face will develop and may gradually spread beyond the nose and cheeks to the forehead and chin.  In some cases, the ears, chest, and back could be affected.  Rosacea may appear as tiny blood vessels or small red bumps that occur in crops.  Frequently they can contain pus, and are called "pustules".  If the bumps do not contain pus, they are referred to as "papules".  Rarely, in prolonged, untreated cases of rosacea, the oil glands of the nose and cheeks may become permanently enlarged.  This is called rhinophyma, and is seen more frequently in men.  Signs and Risks In its beginning stages, rosacea tends to come and go, which makes it difficult to recognize.  It can start as intermittent flushing of the face.  Eventually, blood vessels may become permanently visible.  Pustules and papules can appear, but can be mistaken for adult acne.  People of all races, ages, genders and ethnic groups are at risk of developing rosacea.  However, it is more common in women (especially around menopause) and adults with fair skin between the ages of 36 and 19.  Treatment Dermatologists typically recommend a combination of treatments to effectively manage rosacea.  Treatment can improve symptoms and may stop the progression of the rosacea.  Treatment may involve both topical and oral medications.  The tetracycline antibiotics are often used for their anti-inflammatory effect; however, because of the possibility of developing antibiotic resistance, they should not be used long term at full  dose.  For dilated blood vessels the options include electrodessication (uses electric current through a small needle), laser treatment, and cosmetics to hide the redness.   With all forms of treatment, improvement is a slow process, and patients may not see any results for the first 3-4 weeks.  It is very important to avoid the sun and other triggers.  Patients must wear sunscreen daily.  Skin Care Instructions: Cleanse the skin with a mild soap such as CeraVe cleanser, Cetaphil cleanser, or Dove soap once or twice daily as needed. Moisturize with Eucerin Redness Relief Daily Perfecting Lotion (has a subtle green tint), CeraVe Moisturizing Cream, or Oil of Olay Daily Moisturizer with sunscreen every morning and/or night as recommended. Makeup should be "non-comedogenic" (won't clog pores) and be labeled "for sensitive skin". Good choices for cosmetics are: Neutrogena, Almay, and Physician's Formula.  Any product with a green tint tends to offset a red complexion. If your eyes are dry and irritated, use artificial tears 2-3 times per day and cleanse the eyelids daily with baby shampoo.  Have your eyes examined at least every 2 years.  Be sure to tell your eye doctor that you have rosacea. Alcoholic beverages tend to cause flushing of the skin, and may make rosacea worse. Always wear sunscreen, protect your skin from extreme hot and cold temperatures, and avoid spicy foods, hot drinks, and mechanical irritation such as rubbing, scrubbing, or massaging the face.  Avoid harsh skin cleansers, cleansing masks, astringents, and exfoliation. If a particular product burns or makes your face feel tight,  then it is likely to flare your rosacea. If you are having difficulty finding a sunscreen that you can tolerate, you may try switching to a chemical-free sunscreen.  These are ones whose active ingredient is zinc oxide or titanium dioxide only.  They should also be fragrance free, non-comedogenic, and labeled for  sensitive skin. Rosacea triggers may vary from person to person.  There are a variety of foods that have been reported to trigger rosacea.  Some patients find that keeping a diary of what they were doing when they flared helps them avoid triggers.   Counseling for BBL / IPL / Laser and Coordination of Care Discussed the treatment option of Broad Band Light (BBL) /Intense Pulsed Light (IPL)/ Laser for skin discoloration, including brown spots and redness.  Typically we recommend at least 1-3 treatment sessions about 5-8 weeks apart for best results.  Cannot have tanned skin when BBL performed, and regular use of sunscreen/photoprotection is advised after the procedure to help maintain results. The patient's condition may also require "maintenance treatments" in the future.  The fee for BBL / laser treatments is $350 per treatment session for the whole face.  A fee can be quoted for other parts of the body.  Insurance typically does not pay for BBL/laser treatments and therefore the fee is an out-of-pocket cost. Recommend prophylactic valtrex treatment. Once scheduled for procedure, will send Rx in prior to patient's appointment.     Due to recent changes in healthcare laws, you may see results of your pathology and/or laboratory studies on MyChart before the doctors have had a chance to review them. We understand that in some cases there may be results that are confusing or concerning to you. Please understand that not all results are received at the same time and often the doctors may need to interpret multiple results in order to provide you with the best plan of care or course of treatment. Therefore, we ask that you please give [place] 2 business days to thoroughly review all your results before contacting the office for clarification. Should we see a critical lab result, you will be contacted sooner.   If You Need Anything After Your Visit  If you have any questions or concerns for your doctor, please  call our main line at [phone] and press option 4 to reach your doctor's medical assistant. If no one answers, please leave a voicemail as directed and we will return your call as soon as possible. Messages left after 4 pm will be answered the following business day.   You may also send [place] a message via MyChart. We typically respond to MyChart messages within 1-2 business days.  For prescription refills, please ask your pharmacy to contact our office. Our fax number is [phone].  If you have an urgent issue when the clinic is closed that cannot wait until the next business day, you can page your doctor at the number below.    Please note that while we do our best to be available for urgent issues outside of office hours, we are not available 24/7.   If you have an urgent issue and are unable to reach [place], you may choose to seek medical care at your doctor's office, retail clinic, urgent care center, or emergency room.  If you have a medical emergency, please immediately call 911 or go to the emergency department.  Pager Numbers  - Dr. Gwen Pounds: [phone]  - Dr. Roseanne Reno: [phone]  - Dr. Katrinka Blazing: [phone]   In  the event of inclement weather, please call our main line at [phone] for an update on the status of any delays or closures.  Dermatology Medication Tips: Please keep the boxes that topical medications come in in order to help keep track of the instructions about where and how to use these. Pharmacies typically print the medication instructions only on the boxes and not directly on the medication tubes.   If your medication is too expensive, please contact our office at [phone] option 4 or send [place] a message through MyChart.   We are unable to tell what your co-pay for medications will be in advance as this is different depending on your insurance coverage. However, we may be able to find a substitute medication at lower cost or fill out paperwork to get  insurance to cover a needed medication.   If a prior authorization is required to get your medication covered by your insurance company, please allow [place] 1-2 business days to complete this process.  Drug prices often vary depending on where the prescription is filled and some pharmacies may offer cheaper prices.  The website www.goodrx.com contains coupons for medications through different pharmacies. The prices here do not account for what the cost may be with help from insurance (it may be cheaper with your insurance), but the website can give you the price if you did not use any insurance.  - You can print the associated coupon and take it with your prescription to the pharmacy.  - You may also stop by our office during regular business hours and pick up a GoodRx coupon card.  - If you need your prescription sent electronically to a different pharmacy, notify our office through [facility] or by phone at [phone] option 4.     Si Usted Necesita Algo Despus de Su Visita  Tambin puede enviarnos un mensaje a travs de Clinical cytogeneticist. Por lo general respondemos a los mensajes de MyChart en el transcurso de 1 a 2 das hbiles.  Para renovar recetas, por favor pida a su farmacia que se ponga en contacto con nuestra oficina. Annie Sable de fax es [place] [phone].  Si tiene un asunto urgente cuando la clnica est cerrada y que no puede esperar hasta el siguiente da hbil, puede llamar/localizar a su doctor(a) al nmero que aparece a continuacin.   Por favor, tenga en cuenta que aunque hacemos todo lo posible para estar disponibles para asuntos urgentes fuera del horario de [place], no estamos disponibles las 24 horas del da, los 7 [address]  [address] de la [place].   Si tiene un problema urgente y no puede comunicarse con nosotros, puede optar por buscar atencin mdica  en el consultorio de su doctor(a), en una clnica privada, en un centro de atencin urgente o en una sala de emergencias.  Si  tiene Engineer, drilling, por favor llame inmediatamente al 911 o vaya a la sala de emergencias.  Nmeros de bper  - Dr. Gwen Pounds: [phone]  - Dra. Roseanne Reno: [phone]  - Dr. Katrinka Blazing: [phone]   En caso de inclemencias del tiempo, por favor llame a Lacy Duverney principal al [phone] para una actualizacin sobre el [place] de cualquier retraso o cierre.  Consejos para la medicacin en dermatologa: Por favor, guarde las cajas en las que vienen los medicamentos de uso tpico para ayudarle a seguir las instrucciones sobre dnde y cmo usarlos. Las farmacias generalmente imprimen las instrucciones del medicamento slo en las cajas y no directamente en los tubos del [place].  Si su medicamento es muy caro, por favor, pngase en contacto con Rolm Gala llamando al [phone] y presione la opcin 4 o envenos un mensaje a travs de Clinical cytogeneticist.   No podemos decirle cul ser su copago por los medicamentos por adelantado ya que esto es diferente dependiendo de la cobertura de su seguro. Sin embargo, es posible que podamos encontrar un medicamento sustituto a Audiological scientist un formulario para que el seguro cubra el medicamento que se considera necesario.   Si se requiere una autorizacin previa para que su compaa de seguros [place] su medicamento, por favor permtanos de 1 a 2 das hbiles para completar [address]  Los precios de los medicamentos varan con frecuencia dependiendo del Environmental consultant de dnde se surte la receta y alguna farmacias pueden ofrecer precios ms baratos.  El sitio web www.goodrx.com tiene cupones para medicamentos de Health and safety inspector. Los precios aqu no tienen en cuenta lo que podra costar con la ayuda del seguro (puede ser ms barato con su seguro), pero el sitio web puede darle el precio si no utiliz Tourist information centre manager.  - Puede imprimir el cupn correspondiente y llevarlo con su receta a la farmacia.  - Tambin puede pasar por nuestra oficina  durante el horario de atencin regular y Education officer, museum una tarjeta de cupones de GoodRx.  - Si necesita que su receta se enve electrnicamente a una farmacia diferente, informe a nuestra oficina a travs de MyChart de [place] o por telfono llamando al [phone] y presione la opcin 4.

## 2023-11-29 NOTE — Progress Notes (Signed)
 Follow-Up Visit   Subjective  Jennifer Kirby is a 68 y.o. female who presents for the following: Skin Cancer Screening and Full Body Skin Exam  The patient presents for Total-Body Skin Exam (TBSE) for skin cancer screening and mole check. The patient has spots, moles and lesions to be evaluated, some may be new or changing. Rosacea of the face, improving some with tacrolimus ointment, metronidazole 0.75% cream, and doxycycline 50 MG twice daily. Patient had recent flare while out eating spaghetti at a restaurant. But has eaten the same thing there again without problem.  History of AKs. No history of skin cancer.    The following portions of the chart were reviewed this encounter and updated as appropriate: medications, allergies, medical history  Review of Systems:  No other skin or systemic complaints except as noted in HPI or Assessment and Plan.  Objective  Well appearing patient in no apparent distress; mood and affect are within normal limits.  A full examination was performed including scalp, head, eyes, ears, nose, lips, neck, chest, axillae, abdomen, back, buttocks, bilateral upper extremities, bilateral lower extremities, hands, feet, fingers, toes, fingernails, and toenails. All findings within normal limits unless otherwise noted below.   Relevant physical exam findings are noted in the Assessment and Plan.    Assessment & Plan   SKIN CANCER SCREENING PERFORMED TODAY.  ACTINIC DAMAGE - Chronic condition, secondary to cumulative UV/sun exposure - diffuse scaly erythematous macules with underlying dyspigmentation - Recommend daily broad spectrum sunscreen SPF 30+ to sun-exposed areas, reapply every 2 hours as needed.  - Staying in the shade or wearing long sleeves, sun glasses (UVA+UVB protection) and wide brim hats (4-inch brim around the entire circumference of the hat) are also recommended for sun protection.  - Call for new or changing lesions.  LENTIGINES,  SEBORRHEIC KERATOSES, HEMANGIOMAS - Benign normal skin lesions - Benign-appearing - Call for any changes  MELANOCYTIC NEVI - Tan-brown and/or pink-flesh-colored symmetric macules and papules - Benign appearing on exam today - Observation - Call clinic for new or changing moles - Recommend daily use of broad spectrum spf 30+ sunscreen to sun-exposed areas.   ROSACEA Exam Erythema with telangiectasias at malar cheeks  Chronic and persistent condition with duration or expected duration over one year. Condition is improving with treatment but not currently at goal.   Rosacea is a chronic progressive skin condition usually affecting the face of adults, causing redness and/or acne bumps. It is treatable but not curable. It sometimes affects the eyes (ocular rosacea) as well. It may respond to topical and/or systemic medication and can flare with stress, sun exposure, alcohol, exercise, topical steroids (including hydrocortisone/cortisone 10) and some foods.  Daily application of broad spectrum spf 30+ sunscreen to face is recommended to reduce flares.  Treatment Plan Decrease doxycycline 50 MG to once daily until finished. Will send in refill for pt to take prn flares. Discussed low dose doxycycline daily if patient continues to have frequent flares. Continue metronidazole 0.75% cream once to twice daily to face.  Discussed BBL to treat redness, pt defers at this time Counseling for BBL / IPL / Laser and Coordination of Care Discussed the treatment option of Broad Band Light (BBL) /Intense Pulsed Light (IPL)/ Laser for skin discoloration, including brown spots and redness.  Typically we recommend at least 1-3 treatment sessions about 5-8 weeks apart for best results.  Cannot have tanned skin when BBL performed, and regular use of sunscreen/photoprotection is advised after the procedure to  help maintain results. The patient's condition may also require "maintenance treatments" in the future.  The  fee for BBL / laser treatments is $350 per treatment session for the whole face.  A fee can be quoted for other parts of the body.  Insurance typically does not pay for BBL/laser treatments and therefore the fee is an out-of-pocket cost. Recommend prophylactic valtrex treatment. Once scheduled for procedure, will send Rx in prior to patient's appointment.   Doxycycline should be taken with food to prevent nausea. Do not lay down for 30 minutes after taking. Be cautious with sun exposure and use good sun protection while on this medication. Pregnant women should not take this medication.     DERMATITIS, Seborrheic vs Atopic  Exam: face Clear today  Treatment Plan: Tacrolimus ointment prn flares.   EPIDERMAL INCLUSION CYST Exam: Subcutaneous nodule with overlying crusting at left lower sternum (Hx of recent trauma, pt recently picked). Small subcutaneous nodule at right medial clavicle.  Benign-appearing. Exam most consistent with an epidermal inclusion cyst. Discussed that a cyst is a benign growth that can grow over time and sometimes get irritated or inflamed. Recommend observation if it is not bothersome. Discussed option of surgical excision to remove it if it is growing, symptomatic, or other changes noted. Please call for new or changing lesions so they can be evaluated.  DERMATOFIBROMA Exam: Firm pink/brown papulenodule with dimple sign at left lateral knee. Treatment Plan: A dermatofibroma is a benign growth possibly related to trauma, such as an insect bite, cut from shaving, or inflamed acne-type bump.  Treatment options to remove include shave or excision with resulting scar and risk of recurrence.  Since benign-appearing and not bothersome, will observe for now.   HISTORY OF PRECANCEROUS ACTINIC KERATOSIS - site(s) of PreCancerous Actinic Keratosis clear today. - these may recur and new lesions may form requiring treatment to prevent transformation into skin cancer - observe for  new or changing spots and contact [facility] for appointment if occur - photoprotection with sun protective clothing; sunglasses and broad spectrum sunscreen with SPF of at least 30 + and frequent self skin exams recommended - yearly exams by a dermatologist recommended for persons with history of PreCancerous Actinic Keratoses   ALLERGIC CONTACT DERMATITIS TO HAIR DYE  Exam:  Scalp clear today.  Pt states she gets itchy rash every time she dyes her hair  Treatment:  Pt not interested in stopping coloring her hair Apply thick wide layer Vaseline to hairline at forehead/neck, ears prior to hair coloring to prevent contact to skin. Start Clobetasol shampoo after hair coloring - apply to dry scalp and let sit 20 minutes before rinsing. Use daily until symptoms improved up to 4 weeks.  Apply Tacrolimus ointment 0.1% twice daily to itchy areas on face, neck prn until itchy rash resolved ROSACEA   Related Medications doxycycline (VIBRAMYCIN) 50 MG capsule Take 1 capsule (50 mg total) by mouth 2 (two) times daily. Take with food Return in about 1 year (around 11/28/2024) for TBSE.  ICherlyn Labella, CMA, am acting as scribe for Willeen Niece, MD .   Documentation: I have reviewed the above documentation for accuracy and completeness, and I agree with the above.  Willeen Niece, MD

## 2023-12-01 DIAGNOSIS — K080 Exfoliation of teeth due to systemic causes: Secondary | ICD-10-CM | POA: Diagnosis not present

## 2023-12-04 ENCOUNTER — Other Ambulatory Visit: Payer: Self-pay | Admitting: Family Medicine

## 2023-12-04 DIAGNOSIS — K2100 Gastro-esophageal reflux disease with esophagitis, without bleeding: Secondary | ICD-10-CM

## 2023-12-04 DIAGNOSIS — R1319 Other dysphagia: Secondary | ICD-10-CM

## 2023-12-10 ENCOUNTER — Ambulatory Visit

## 2023-12-10 ENCOUNTER — Encounter

## 2023-12-10 DIAGNOSIS — K2289 Other specified disease of esophagus: Secondary | ICD-10-CM | POA: Diagnosis not present

## 2023-12-10 DIAGNOSIS — K317 Polyp of stomach and duodenum: Secondary | ICD-10-CM | POA: Diagnosis not present

## 2023-12-10 DIAGNOSIS — K219 Gastro-esophageal reflux disease without esophagitis: Secondary | ICD-10-CM | POA: Diagnosis not present

## 2023-12-10 DIAGNOSIS — K449 Diaphragmatic hernia without obstruction or gangrene: Secondary | ICD-10-CM | POA: Diagnosis not present

## 2023-12-13 ENCOUNTER — Encounter: Payer: Self-pay | Admitting: Family Medicine

## 2023-12-13 ENCOUNTER — Ambulatory Visit (INDEPENDENT_AMBULATORY_CARE_PROVIDER_SITE_OTHER): Admitting: Family Medicine

## 2023-12-13 ENCOUNTER — Other Ambulatory Visit: Payer: Self-pay

## 2023-12-13 ENCOUNTER — Other Ambulatory Visit: Payer: Self-pay | Admitting: Family Medicine

## 2023-12-13 ENCOUNTER — Ambulatory Visit: Payer: Self-pay | Admitting: Family Medicine

## 2023-12-13 VITALS — BP 122/78 | HR 78 | Ht 59.0 in | Wt 149.0 lb

## 2023-12-13 DIAGNOSIS — R1319 Other dysphagia: Secondary | ICD-10-CM

## 2023-12-13 DIAGNOSIS — E7801 Familial hypercholesterolemia: Secondary | ICD-10-CM | POA: Diagnosis not present

## 2023-12-13 DIAGNOSIS — R0981 Nasal congestion: Secondary | ICD-10-CM

## 2023-12-13 DIAGNOSIS — J301 Allergic rhinitis due to pollen: Secondary | ICD-10-CM

## 2023-12-13 DIAGNOSIS — R051 Acute cough: Secondary | ICD-10-CM

## 2023-12-13 DIAGNOSIS — J0100 Acute maxillary sinusitis, unspecified: Secondary | ICD-10-CM

## 2023-12-13 DIAGNOSIS — K2100 Gastro-esophageal reflux disease with esophagitis, without bleeding: Secondary | ICD-10-CM

## 2023-12-13 LAB — POC SOFIA 2 FLU + SARS ANTIGEN FIA
Influenza A, POC: NEGATIVE
Influenza B, POC: NEGATIVE
SARS Coronavirus 2 Ag: NEGATIVE

## 2023-12-13 MED ORDER — MONTELUKAST SODIUM 10 MG PO TABS: 10.0000 mg | ORAL_TABLET | Freq: Every day | ORAL | 3 refills | Status: DC | PRN

## 2023-12-13 MED ORDER — ROSUVASTATIN CALCIUM 5 MG PO TABS: ORAL_TABLET | ORAL | 1 refills | Status: AC

## 2023-12-13 MED ORDER — PROMETHAZINE-DM 6.25-15 MG/5ML PO SYRP: 5.0000 mL | ORAL_SOLUTION | Freq: Four times a day (QID) | ORAL | 0 refills | Status: DC | PRN

## 2023-12-13 MED ORDER — AMOXICILLIN 500 MG PO CAPS: 500.0000 mg | ORAL_CAPSULE | Freq: Three times a day (TID) | ORAL | 0 refills | Status: AC

## 2023-12-13 MED ORDER — MONTELUKAST SODIUM 10 MG PO TABS: 10.0000 mg | ORAL_TABLET | ORAL | 3 refills | Status: DC | PRN

## 2023-12-13 NOTE — Telephone Encounter (Signed)
 Copied from CRM [id number]. Topic: Clinical - Prescription Issue >> Dec 13, 2023 11:48 AM Epimenio Foot F wrote: Reason for CRM: [facility] pharmacy is calling in because they received a prescription for montelukast (SINGULAIR) 10 MG tablet [[id number]] and they needed clarification. Per the pharmacist, the instructions say take as needed, and the medication cannot be dispensed with those instructions. Pharmacist wanted to know was it supposed to say once a day as needed. Please advise.

## 2023-12-13 NOTE — Telephone Encounter (Signed)
 Copied from CRM [id number]. Topic: Clinical - Prescription Issue >> Dec 13, 2023 11:48 AM Epimenio Foot F wrote: Reason for CRM: [facility] pharmacy is calling in because they received a prescription for montelukast (SINGULAIR) 10 MG tablet [[id number]] and they needed clarification. Per the pharmacist, the instructions say take as needed, and the medication cannot be dispensed with those instructions. Pharmacist wanted to know was it supposed to say once a day as needed. Please advise.  [facility] pharmacy called for clarification on the patient's Singulair prescription. I advised that the progress notes read as follows:  2. Seasonal allergic rhinitis due to pollen New onset.  Persistent.  Other than Mucinex DM during the day I have suggested that patient continue her Singulair 10 mg once a day and limit exposure to outside pollen. - montelukast (SINGULAIR) 10 MG tablet; Take 1 tablet (10 mg total) by mouth as needed.  Dispense: 90 tablet; Refill: 3  They will dispense with instructions for the patient to take 1 tablet daily.    Reason for Disposition  [1] Other NON-URGENT information for PCP AND [2] does not require PCP response  Answer Assessment - Initial Assessment Questions 1. REASON FOR CALL or QUESTION: "What is your reason for calling today?" or "How can I best help you?" or "What question do you have that I can help answer?"     Prescription clarification for Singulair  2. CALLER: Document the source of call. (e.g., laboratory, patient).     [facility] pharmacy  Protocols used: PCP Call - No Triage-A-AH

## 2023-12-13 NOTE — Progress Notes (Signed)
 Date:  12/13/2023   Name:  Jennifer Kirby   DOB:  1956-05-18   MRN:  [id number]   Chief Complaint: No chief complaint on file.  Hyperlipidemia This is a chronic problem. The current episode started more than 1 year ago. The problem is controlled. Recent lipid tests were reviewed and are normal. She has no history of chronic renal disease, diabetes, hypothyroidism, liver disease, obesity or nephrotic syndrome. There are no known factors aggravating her hyperlipidemia. Pertinent negatives include no chest pain, focal sensory loss, focal weakness, leg pain, myalgias or shortness of breath. Current antihyperlipidemic treatment includes statins. The current treatment provides moderate improvement of lipids. There are no compliance problems.  Risk factors for coronary artery disease include dyslipidemia and hypertension.  Gastroesophageal Reflux She complains of coughing, heartburn and a sore throat. She reports no abdominal pain, no chest pain, no choking, no dysphagia, no hoarse voice, no nausea or no wheezing. This is a chronic problem. The current episode started more than 1 year ago. The problem occurs rarely. The problem has been gradually improving. The heartburn is of moderate intensity. The symptoms are aggravated by certain foods. Pertinent negatives include no fatigue. She has tried a PPI for the symptoms. The treatment provided moderate relief.  URI  This is a new problem. The current episode started in the past 7 days. The problem has been waxing and waning. There has been no fever. Associated symptoms include congestion, coughing, rhinorrhea, sinus pain, sneezing and a sore throat. Pertinent negatives include no abdominal pain, chest pain, diarrhea, headaches, nausea or wheezing. She has tried acetaminophen for the symptoms.    Lab Results  Component Value Date   NA 141 11/05/2022   K 4.4 11/05/2022   CO2 23 11/05/2022   GLUCOSE 95 11/05/2022   BUN 11 11/05/2022   CREATININE  0.57 11/05/2022   CALCIUM 9.3 11/05/2022   EGFR 99 11/05/2022   GFRNONAA >60 08/25/2022   Lab Results  Component Value Date   CHOL 222 (H) 05/04/2023   HDL 79 05/04/2023   LDLCALC 130 (H) 05/04/2023   TRIG 77 05/04/2023   CHOLHDL 3.5 07/28/2017   No results found for: "TSH" No results found for: "HGBA1C" Lab Results  Component Value Date   WBC 6.4 08/25/2022   HGB 13.8 08/25/2022   HCT 40.3 08/25/2022   MCV 86.1 08/25/2022   PLT 283 08/25/2022   Lab Results  Component Value Date   ALT 13 05/04/2023   AST 19 05/04/2023   ALKPHOS 68 05/04/2023   BILITOT 0.3 05/04/2023   No results found for: "25OHVITD2", "25OHVITD3", "VD25OH"   Review of Systems  Constitutional:  Positive for chills. Negative for fatigue and fever.  HENT:  Positive for congestion, postnasal drip, rhinorrhea, sinus pressure, sinus pain, sneezing and sore throat. Negative for hoarse voice and nosebleeds.   Eyes:  Negative for visual disturbance.  Respiratory:  Positive for cough. Negative for choking, chest tightness, shortness of breath and wheezing.   Cardiovascular:  Negative for chest pain and palpitations.  Gastrointestinal:  Positive for heartburn. Negative for abdominal pain, blood in stool, constipation, diarrhea, dysphagia and nausea.  Genitourinary:  Negative for difficulty urinating.  Musculoskeletal:  Negative for myalgias.  Neurological:  Negative for focal weakness and headaches.    Patient Active Problem List   Diagnosis Date Noted   Pharyngoesophageal dysphagia 10/28/2023   Dupuytren's disease of palm 01/21/2023   Seasonal allergic rhinitis due to pollen 07/25/2021   Gastroesophageal reflux disease  07/25/2021   Traumatic partial tear of left biceps tendon 05/26/2019   Traumatic complete tear of left rotator cuff 05/26/2019   Synovitis of left shoulder 05/26/2019   Subacromial impingement, left 05/26/2019   Obesity (BMI 30-39.9) 11/25/2018   Contact dermatitis 02/15/2018   Mass of  skin of right shoulder 07/27/2016   Brachial neuritis 03/08/2015   Encounter for general adult medical examination without abnormal findings 03/08/2015   Herpes zoster 03/08/2015   H/O hypercholesterolemia 03/08/2015   HLD (hyperlipidemia) 03/08/2015    Allergies  Allergen Reactions   Statins     Makes me hurt    Zetia [Ezetimibe] Other (See Comments)    Gave leg cramps    Past Surgical History:  Procedure Laterality Date   COLONOSCOPY  11/12/2017   FOOT FRACTURE SURGERY Left    PLANTAR FASCIECTOMY Left 2008   ROTATOR CUFF REPAIR Left 05/26/2019   bicep tear, tendon repair and skin graft - [facility]    SHOULDER ARTHROSCOPY Right 08/27/2022   Procedure: Right shoulder arthroscopic versus mini open subscapularis and supraspinatus repair, subacromial decompression, distal clavicle excision, and biceps tenodesis;  Surgeon: Signa Kell, MD;  Location: [facility] ORS;  Service: Orthopedics;  Laterality: Right;    Social History   Tobacco Use   Smoking status: Never   Smokeless tobacco: Never  Vaping Use   Vaping status: Never Used  Substance Use Topics   Alcohol use: Not Currently   Drug use: No     Medication list has been reviewed and updated.  Current Meds  Medication Sig   calcium carbonate (TUMS - DOSED IN MG ELEMENTAL CALCIUM) 500 MG chewable tablet Chew 1 tablet by mouth daily as needed.   doxycycline (VIBRAMYCIN) 50 MG capsule Take 1 capsule (50 mg total) by mouth 2 (two) times daily. Take with food   metroNIDAZOLE (METROCREAM) 0.75 % cream Apply topically 2 (two) times daily. Apply on face for rosacea every day bid   montelukast (SINGULAIR) 10 MG tablet Take 1 tablet (10 mg total) by mouth as needed.   pantoprazole (PROTONIX) 40 MG tablet TAKE 1 TABLET BY MOUTH EVERY DAY   rosuvastatin (CRESTOR) 5 MG tablet TAKE 1 TABLET (5 MG TOTAL) BY MOUTH 2 (TWO) TIMES A WEEK ON TUESDAY AND THURSDAY   Sennosides-Docusate Sodium (SENNA PLUS) 8.6-50 MG CAPS Take 1 capsule by mouth daily.    tacrolimus (PROTOPIC) 0.1 % ointment Apply topically 2 (two) times daily. Apply to face BID       12/13/2023    9:44 AM 10/05/2023    4:42 PM 05/03/2023   11:08 AM 01/22/2023    3:02 PM  GAD 7 : Generalized Anxiety Score  Nervous, Anxious, on Edge 0 0 0 0  Control/stop worrying 0 0 0 0  Worry too much - different things 0 0 0 0  Trouble relaxing 0 0 0 0  Restless 0 0 0 0  Easily annoyed or irritable 0 0 0 0  Afraid - awful might happen 0 0 0 0  Total GAD 7 Score 0 0 0 0  Anxiety Difficulty Not difficult at all Not difficult at all Not difficult at all Not difficult at all       12/13/2023    9:44 AM 10/05/2023    4:41 PM 05/03/2023   11:08 AM  Depression screen PHQ 2/9  Decreased Interest 0 0 0  Down, Depressed, Hopeless 0 0 0  PHQ - 2 Score 0 0 0  Altered sleeping 0 3 0  Tired, decreased energy 0 1 0  Change in appetite 0 2 0  Feeling bad or failure about yourself  0 0 0  Trouble concentrating 0 0 0  Moving slowly or fidgety/restless 0 0 0  Suicidal thoughts 0 0 0  PHQ-9 Score 0 6 0  Difficult doing work/chores Not difficult at all Somewhat difficult Not difficult at all    BP Readings from Last 3 Encounters:  12/13/23 122/78  10/05/23 127/77  05/03/23 100/70    Physical Exam Vitals and nursing note reviewed.  Constitutional:      General: She is not in acute distress.    Appearance: She is not diaphoretic.  HENT:     Head: Normocephalic and atraumatic.     Right Ear: Tympanic membrane and external ear normal.     Left Ear: Tympanic membrane and external ear normal.     Nose:     Right Turbinates: Swollen.     Left Turbinates: Swollen.     Right Sinus: Maxillary sinus tenderness present. No frontal sinus tenderness.     Left Sinus: No maxillary sinus tenderness or frontal sinus tenderness.     Mouth/Throat:     Pharynx: Oropharynx is clear. Posterior oropharyngeal erythema present.  Eyes:     General:        Right eye: No discharge.        Left eye: No  discharge.     Conjunctiva/sclera: Conjunctivae normal.     Pupils: Pupils are equal, round, and reactive to light.  Neck:     Thyroid: No thyromegaly.     Vascular: No JVD.  Cardiovascular:     Rate and Rhythm: Normal rate and regular rhythm.     Heart sounds: Normal heart sounds. No murmur heard.    No friction rub. No gallop.  Pulmonary:     Effort: Pulmonary effort is normal.     Breath sounds: Normal breath sounds. No wheezing, rhonchi or rales.  Abdominal:     General: Bowel sounds are normal.     Palpations: Abdomen is soft. There is no mass.     Tenderness: There is no abdominal tenderness. There is no guarding.  Musculoskeletal:        General: Normal range of motion.     Cervical back: Normal range of motion and neck supple.  Lymphadenopathy:     Cervical: No cervical adenopathy.  Skin:    General: Skin is warm and dry.  Neurological:     Mental Status: She is alert.     Wt Readings from Last 3 Encounters:  12/13/23 149 lb (67.6 kg)  10/05/23 151 lb (68.5 kg)  05/03/23 151 lb (68.5 kg)    BP 122/78   Pulse 78   Ht 4\' 11"  (1.499 m)   Wt 149 lb (67.6 kg)   SpO2 98%   BMI 30.09 kg/m   Assessment and Plan: 1. Nasal congestion New onset.  Persistent.  COVID and influenza unremarkable.  Patient has been exposed to RSV. - POC SOFIA 2 FLU + SARS ANTIGEN FIA  2. Seasonal allergic rhinitis due to pollen New onset.  Persistent.  Other than Mucinex DM during the day I have suggested that patient continue her Singulair 10 mg once a day and limit exposure to outside pollen. - montelukast (SINGULAIR) 10 MG tablet; Take 1 tablet (10 mg total) by mouth as needed.  Dispense: 90 tablet; Refill: 3  3. Esophageal dysphagia Esophageal dysphagia was found to have his hiatal hernia  and we will continue to treat with pantoprazole 40 mg patient would prefer to be twice a day.  4. Gastroesophageal reflux disease with esophagitis without hemorrhage Patient is followed by Dr.  Norma Fredrickson and usual ask him to assume prescriptive responsibility for pantoprazole 40 mg  5. Familial hypercholesterolemia (Primary) Chronic.  Controlled.  Stable.  Asymptomatic.  Without myalgias or muscle weakness.  As long as patient takes Crestor 5 mg twice a week is reasonably acceptable at this time. - rosuvastatin (CRESTOR) 5 MG tablet; TAKE 1 TABLET (5 MG TOTAL) BY MOUTH 2 (TWO) TIMES A WEEK ON TUESDAY AND THURSDAY  Dispense: 24 tablet; Refill: 1 - Lipid Panel With LDL/HDL Ratio  6. Acute maxillary sinusitis, recurrence not specified New onset.  Sinus with nature.  Will treat for maxillary sinusitis marked - amoxicillin (AMOXIL) 500 MG capsule; Take 1 capsule (500 mg total) by mouth 3 (three) times daily for 10 days.  Dispense: 30 capsule; Refill: 0  7. Acute cough Encouraged Mucinex DM during the morning hours and promethazine with dextromethorphan nightly. - promethazine-dextromethorphan (PROMETHAZINE-DM) 6.25-15 MG/5ML syrup; Take 5 mLs by mouth 4 (four) times daily as needed for cough.  Dispense: 118 mL; Refill: 0     Elizabeth Sauer, MD

## 2023-12-13 NOTE — Telephone Encounter (Signed)
 Change the prescription to once a day as needed.  KP

## 2023-12-24 DIAGNOSIS — K219 Gastro-esophageal reflux disease without esophagitis: Secondary | ICD-10-CM | POA: Diagnosis not present

## 2023-12-24 DIAGNOSIS — Z8639 Personal history of other endocrine, nutritional and metabolic disease: Secondary | ICD-10-CM | POA: Diagnosis not present

## 2023-12-24 DIAGNOSIS — E785 Hyperlipidemia, unspecified: Secondary | ICD-10-CM | POA: Diagnosis not present

## 2024-01-05 ENCOUNTER — Ambulatory Visit: Admitting: Dermatology

## 2024-01-05 ENCOUNTER — Encounter: Payer: Self-pay | Admitting: Dermatology

## 2024-01-05 DIAGNOSIS — L309 Dermatitis, unspecified: Secondary | ICD-10-CM | POA: Diagnosis not present

## 2024-01-05 DIAGNOSIS — Z79899 Other long term (current) drug therapy: Secondary | ICD-10-CM

## 2024-01-05 DIAGNOSIS — L234 Allergic contact dermatitis due to dyes: Secondary | ICD-10-CM

## 2024-01-05 MED ORDER — CLOBETASOL PROPIONATE 0.05 % EX SHAM: MEDICATED_SHAMPOO | CUTANEOUS | 2 refills | Status: DC

## 2024-01-05 MED ORDER — KETOCONAZOLE 2 % EX CREA: TOPICAL_CREAM | CUTANEOUS | 2 refills | Status: AC

## 2024-01-05 NOTE — Progress Notes (Signed)
   Follow Up Visit   Subjective  Jennifer Kirby is a 68 y.o. female who presents for the following: Rash on face. Flaring. Duration: ~1 week. Was using Metronidazole as directed, stopped Monday when rash flared. Used Tacrolimus 1 day when flared but stopped because it did not help. Scaly, itching at first. Red, inflamed. Pt has seasonal allergies which are bad right now.   The following portions of the chart were reviewed this encounter and updated as appropriate: medications, allergies, medical history  Review of Systems:  No other skin or systemic complaints except as noted in HPI or Assessment and Plan.  Objective  Well appearing patient in no apparent distress; mood and affect are within normal limits.  A focused examination was performed of the following areas: Face, scalp  Relevant exam findings are noted in the Assessment and Plan.    Assessment & Plan     Dermatitis- Seborrheic dermatitis vs Atopic vs Contact Exam: Scaly pink patch at forehead and temples, paranasal, scalp clear  Chronic and persistent condition with duration or expected duration over one year. Condition is bothersome/symptomatic for patient. Currently flared.   Treatment Plan: D/c metrocream and doxy at this time, doubt rosacea Start Opzelura cream twice daily to affected areas on face, when improved decrease to once a day.  Samples given x 2, when run out, restart tacrolimus 0.1% ointment every day/bid Start Ketoconazole 2% cream once daily with using Opzelura once daily. OR Promiseb samples given today to be used in the place of Ketoconazole. Patient will call if Promiseb works well, will send in prescription.   Eucerin hydrogel eczema moisturizer samples given.   RTC if not improving after 2 weeks. Consider patch testing if continuing to flare.    ALLERGIC CONTACT DERMATITIS TO HAIR DYE   Exam:  Scalp clear today.  Pt states she gets itchy rash every time she dyes her hair    Treatment:  Pt not interested in stopping coloring her hair Apply thick wide layer Vaseline to hairline at forehead/neck, ears prior to hair coloring to prevent contact to skin.  Start Clobetasol shampoo after hair coloring - apply to dry scalp and let sit 20 minutes before rinsing. Use daily until symptoms improved up to 4 weeks.  Pt given goodRx coupon for [facility] for $38.  Her insurance was charging $99 so she didn't get it last visit.   Return for TBSE As Scheduled.  I, Lawson Radar, CMA, am acting as scribe for Willeen Niece, MD.   Documentation: I have reviewed the above documentation for accuracy and completeness, and I agree with the above.  Willeen Niece, MD

## 2024-01-05 NOTE — Patient Instructions (Addendum)
 Start Opzelura cream twice daily to affected areas on face, when improved decrease to once a day.   Start Ketoconazole 2% cream OR Promiseb once daily with using Opzelura once daily.  If you like Promiseb, and it works well,  send [place] a Clinical cytogeneticist message or call and we can send that in for you in the place of Ketoconazole.     Due to recent changes in healthcare laws, you may see results of your pathology and/or laboratory studies on MyChart before the doctors have had a chance to review them. We understand that in some cases there may be results that are confusing or concerning to you. Please understand that not all results are received at the same time and often the doctors may need to interpret multiple results in order to provide you with the best plan of care or course of treatment. Therefore, we ask that you please give [place] 2 business days to thoroughly review all your results before contacting the office for clarification. Should we see a critical lab result, you will be contacted sooner.   If You Need Anything After Your Visit  If you have any questions or concerns for your doctor, please call our main line at [phone] and press option 4 to reach your doctor's medical assistant. If no one answers, please leave a voicemail as directed and we will return your call as soon as possible. Messages left after 4 pm will be answered the following business day.   You may also send [place] a message via MyChart. We typically respond to MyChart messages within 1-2 business days.  For prescription refills, please ask your pharmacy to contact our office. Our fax number is [phone].  If you have an urgent issue when the clinic is closed that cannot wait until the next business day, you can page your doctor at the number below.    Please note that while we do our best to be available for urgent issues outside of office hours, we are not available 24/7.   If you have an urgent issue and are unable to  reach [place], you may choose to seek medical care at your doctor's office, retail clinic, urgent care center, or emergency room.  If you have a medical emergency, please immediately call 911 or go to the emergency department.  Pager Numbers  - Dr. Gwen Pounds: [phone]  - Dr. Roseanne Reno: [phone]  - Dr. Katrinka Blazing: [phone]   In the event of inclement weather, please call our main line at [phone] for an update on the status of any delays or closures.  Dermatology Medication Tips: Please keep the boxes that topical medications come in in order to help keep track of the instructions about where and how to use these. Pharmacies typically print the medication instructions only on the boxes and not directly on the medication tubes.   If your medication is too expensive, please contact our office at [phone] option 4 or send [place] a message through MyChart.   We are unable to tell what your co-pay for medications will be in advance as this is different depending on your insurance coverage. However, we may be able to find a substitute medication at lower cost or fill out paperwork to get insurance to cover a needed medication.   If a prior authorization is required to get your medication covered by your insurance company, please allow [place] 1-2 business days to complete this process.  Drug prices often vary depending on where the prescription is filled and  some pharmacies may offer cheaper prices.  The website www.goodrx.com contains coupons for medications through different pharmacies. The prices here do not account for what the cost may be with help from insurance (it may be cheaper with your insurance), but the website can give you the price if you did not use any insurance.  - You can print the associated coupon and take it with your prescription to the pharmacy.  - You may also stop by our office during regular business hours and pick up a GoodRx coupon card.  - If you need your  prescription sent electronically to a different pharmacy, notify our office through [facility] or by phone at [phone] option 4.     Si Usted Necesita Algo Despus de Su Visita  Tambin puede enviarnos un mensaje a travs de Clinical cytogeneticist. Por lo general respondemos a los mensajes de MyChart en el transcurso de 1 a 2 das hbiles.  Para renovar recetas, por favor pida a su farmacia que se ponga en contacto con nuestra oficina. Annie Sable de fax es [place] [phone].  Si tiene un asunto urgente cuando la clnica est cerrada y que no puede esperar hasta el siguiente da hbil, puede llamar/localizar a su doctor(a) al nmero que aparece a continuacin.   Por favor, tenga en cuenta que aunque hacemos todo lo posible para estar disponibles para asuntos urgentes fuera del horario de [place], no estamos disponibles las 24 horas del da, los 7 [address]  [address] de la [place].   Si tiene un problema urgente y no puede comunicarse con nosotros, puede optar por buscar atencin mdica  en el consultorio de su doctor(a), en una clnica privada, en un centro de atencin urgente o en una sala de emergencias.  Si tiene Engineer, drilling, por favor llame inmediatamente al 911 o vaya a la sala de emergencias.  Nmeros de bper  - Dr. Gwen Pounds: [phone]  - Dra. Roseanne Reno: [phone]  - Dr. Katrinka Blazing: [phone]   En caso de inclemencias del tiempo, por favor llame a Lacy Duverney principal al [phone] para una actualizacin sobre el [place] de cualquier retraso o cierre.  Consejos para la medicacin en dermatologa: Por favor, guarde las cajas en las que vienen los medicamentos de uso tpico para ayudarle a seguir las instrucciones sobre dnde y cmo usarlos. Las farmacias generalmente imprimen las instrucciones del medicamento slo en las cajas y no directamente en los tubos del [place].   Si su medicamento es muy caro, por favor, pngase en contacto con Rolm Gala llamando al  [phone] y presione la opcin 4 o envenos un mensaje a travs de Clinical cytogeneticist.   No podemos decirle cul ser su copago por los medicamentos por adelantado ya que esto es diferente dependiendo de la cobertura de su seguro. Sin embargo, es posible que podamos encontrar un medicamento sustituto a Audiological scientist un formulario para que el seguro cubra el medicamento que se considera necesario.   Si se requiere una autorizacin previa para que su compaa de seguros [place] su medicamento, por favor permtanos de 1 a 2 das hbiles para completar [address]  Los precios de los medicamentos varan con frecuencia dependiendo del Environmental consultant de dnde se surte la receta y alguna farmacias pueden ofrecer precios ms baratos.  El sitio web www.goodrx.com tiene cupones para medicamentos de Health and safety inspector. Los precios aqu no tienen en cuenta lo que podra costar con la ayuda del seguro (puede ser ms barato con su seguro), pero el sitio web puede darle el  precio si no Visual merchandiser.  - Puede imprimir el cupn correspondiente y llevarlo con su receta a la farmacia.  - Tambin puede pasar por nuestra oficina durante el horario de atencin regular y Education officer, museum una tarjeta de cupones de GoodRx.  - Si necesita que su receta se enve electrnicamente a una farmacia diferente, informe a nuestra oficina a travs de MyChart de [facility] o por telfono llamando al [phone] y presione la opcin 4.

## 2024-01-09 ENCOUNTER — Encounter: Payer: Self-pay | Admitting: Family Medicine

## 2024-01-10 ENCOUNTER — Other Ambulatory Visit: Payer: Self-pay | Admitting: Family Medicine

## 2024-01-10 ENCOUNTER — Ambulatory Visit (INDEPENDENT_AMBULATORY_CARE_PROVIDER_SITE_OTHER): Admitting: Family Medicine

## 2024-01-10 ENCOUNTER — Encounter: Payer: Self-pay | Admitting: Family Medicine

## 2024-01-10 VITALS — BP 118/76 | HR 92 | Resp 16 | Ht 59.0 in | Wt 149.0 lb

## 2024-01-10 DIAGNOSIS — N3090 Cystitis, unspecified without hematuria: Secondary | ICD-10-CM

## 2024-01-10 DIAGNOSIS — R300 Dysuria: Secondary | ICD-10-CM

## 2024-01-10 LAB — POCT URINE DIPSTICK
Bilirubin, UA: NEGATIVE
Glucose, UA: NEGATIVE mg/dL
Ketones, POC UA: NEGATIVE mg/dL
Nitrite, UA: POSITIVE — AB
POC PROTEIN,UA: NEGATIVE
Spec Grav, UA: 1.015 (ref 1.010–1.025)
Urobilinogen, UA: 0.2 U/dL
pH, UA: 6 (ref 5.0–8.0)

## 2024-01-10 MED ORDER — CEPHALEXIN 500 MG PO CAPS: 500.0000 mg | ORAL_CAPSULE | Freq: Three times a day (TID) | ORAL | 0 refills | Status: DC

## 2024-01-10 NOTE — Progress Notes (Signed)
 Date:  01/10/2024   Name:  Jennifer Kirby   DOB:  [birth year]   MRN:  [id number]   Chief Complaint: Dysuria (Started Thursday )  Dysuria  This is a new problem. The current episode started in the past 7 days. The problem has been gradually worsening. The quality of the pain is described as burning. The pain is moderate. There has been no fever. Associated symptoms include flank pain, frequency, hesitancy and urgency. Pertinent negatives include no chills, discharge, hematuria, nausea, sweats or vomiting. Treatments tried: azo. There is no history of kidney stones, recurrent UTIs or urinary stasis.    Lab Results  Component Value Date   NA 141 11/05/2022   K 4.4 11/05/2022   CO2 23 11/05/2022   GLUCOSE 95 11/05/2022   BUN 11 11/05/2022   CREATININE 0.57 11/05/2022   CALCIUM 9.3 11/05/2022   EGFR 99 11/05/2022   GFRNONAA >60 08/25/2022   Lab Results  Component Value Date   CHOL 222 (H) 05/04/2023   HDL 79 05/04/2023   LDLCALC 130 (H) 05/04/2023   TRIG 77 05/04/2023   CHOLHDL 3.5 07/28/2017   No results found for: "TSH" No results found for: "HGBA1C" Lab Results  Component Value Date   WBC 6.4 08/25/2022   HGB 13.8 08/25/2022   HCT 40.3 08/25/2022   MCV 86.1 08/25/2022   PLT 283 08/25/2022   Lab Results  Component Value Date   ALT 13 05/04/2023   AST 19 05/04/2023   ALKPHOS 68 05/04/2023   BILITOT 0.3 05/04/2023   No results found for: "25OHVITD2", "25OHVITD3", "VD25OH"   Review of Systems  Constitutional:  Negative for chills.  Respiratory:  Negative for chest tightness and shortness of breath.   Cardiovascular:  Negative for chest pain.  Gastrointestinal:  Negative for blood in stool, nausea and vomiting.  Genitourinary:  Positive for difficulty urinating, dysuria, flank pain, frequency, hesitancy and urgency. Negative for decreased urine volume, hematuria, vaginal bleeding and vaginal discharge.    Patient Active Problem List   Diagnosis Date Noted    Pharyngoesophageal dysphagia 10/28/2023   Dupuytren's disease of palm 01/21/2023   Seasonal allergic rhinitis due to pollen 07/25/2021   Gastroesophageal reflux disease 07/25/2021   Traumatic partial tear of left biceps tendon 05/26/2019   Traumatic complete tear of left rotator cuff 05/26/2019   Synovitis of left shoulder 05/26/2019   Subacromial impingement, left 05/26/2019   Obesity (BMI 30-39.9) 11/25/2018   Contact dermatitis 02/15/2018   Mass of skin of right shoulder 07/27/2016   Brachial neuritis 03/08/2015   Encounter for general adult medical examination without abnormal findings 03/08/2015   Herpes zoster 03/08/2015   H/O hypercholesterolemia 03/08/2015   HLD (hyperlipidemia) 03/08/2015    Allergies  Allergen Reactions   Statins     Makes me hurt    Zetia [Ezetimibe] Other (See Comments)    Gave leg cramps    Past Surgical History:  Procedure Laterality Date   COLONOSCOPY  11/12/2017   FOOT FRACTURE SURGERY Left    PLANTAR FASCIECTOMY Left 2008   ROTATOR CUFF REPAIR Left 05/26/2019   bicep tear, tendon repair and skin graft - [facility]    SHOULDER ARTHROSCOPY Right 08/27/2022   Procedure: Right shoulder arthroscopic versus mini open subscapularis and supraspinatus repair, subacromial decompression, distal clavicle excision, and biceps tenodesis;  Surgeon: Signa Kell, MD;  Location: [facility] ORS;  Service: Orthopedics;  Laterality: Right;    Social History   Tobacco Use   Smoking status: Never  Smokeless tobacco: Never  Vaping Use   Vaping status: Never Used  Substance Use Topics   Alcohol use: Not Currently   Drug use: No     Medication list has been reviewed and updated.  Current Meds  Medication Sig   calcium carbonate (TUMS - DOSED IN MG ELEMENTAL CALCIUM) 500 MG chewable tablet Chew 1 tablet by mouth daily as needed.   Clobetasol Propionate 0.05 % shampoo Apply to dry scalp and let sit 20 minutes before rinsing. Use daily until symptoms improved up  to 4 weeks.   Cranberry-Vitamin C-Probiotic (AZO CRANBERRY PO) Take by mouth.   ketoconazole (NIZORAL) 2 % cream Apply daily to face as directed   metroNIDAZOLE (METROCREAM) 0.75 % cream Apply topically 2 (two) times daily. Apply on face for rosacea every day bid   montelukast (SINGULAIR) 10 MG tablet Take 1 tablet (10 mg total) by mouth daily as needed.   pantoprazole (PROTONIX) 40 MG tablet TAKE 1 TABLET BY MOUTH EVERY DAY   promethazine-dextromethorphan (PROMETHAZINE-DM) 6.25-15 MG/5ML syrup Take 5 mLs by mouth 4 (four) times daily as needed for cough.   rosuvastatin (CRESTOR) 5 MG tablet TAKE 1 TABLET (5 MG TOTAL) BY MOUTH 2 (TWO) TIMES A WEEK ON TUESDAY AND THURSDAY   Sennosides-Docusate Sodium (SENNA PLUS) 8.6-50 MG CAPS Take 1 capsule by mouth daily.   tacrolimus (PROTOPIC) 0.1 % ointment Apply topically 2 (two) times daily. Apply to face BID       12/13/2023    9:44 AM 10/05/2023    4:42 PM 05/03/2023   11:08 AM 01/22/2023    3:02 PM  GAD 7 : Generalized Anxiety Score  Nervous, Anxious, on Edge 0 0 0 0  Control/stop worrying 0 0 0 0  Worry too much - different things 0 0 0 0  Trouble relaxing 0 0 0 0  Restless 0 0 0 0  Easily annoyed or irritable 0 0 0 0  Afraid - awful might happen 0 0 0 0  Total GAD 7 Score 0 0 0 0  Anxiety Difficulty Not difficult at all Not difficult at all Not difficult at all Not difficult at all       12/13/2023    9:44 AM 10/05/2023    4:41 PM 05/03/2023   11:08 AM  Depression screen PHQ 2/9  Decreased Interest 0 0 0  Down, Depressed, Hopeless 0 0 0  PHQ - 2 Score 0 0 0  Altered sleeping 0 3 0  Tired, decreased energy 0 1 0  Change in appetite 0 2 0  Feeling bad or failure about yourself  0 0 0  Trouble concentrating 0 0 0  Moving slowly or fidgety/restless 0 0 0  Suicidal thoughts 0 0 0  PHQ-9 Score 0 6 0  Difficult doing work/chores Not difficult at all Somewhat difficult Not difficult at all    BP Readings from Last 3 Encounters:  01/10/24  118/76  12/13/23 122/78  10/05/23 127/77    Physical Exam Vitals and nursing note reviewed.  Constitutional:      General: She is not in acute distress.    Appearance: She is not diaphoretic.  HENT:     Head: Normocephalic and atraumatic.     Right Ear: Tympanic membrane and external ear normal.     Left Ear: Tympanic membrane and external ear normal.     Nose: Nose normal.     Mouth/Throat:     Mouth: Mucous membranes are moist.  Eyes:  General:        Right eye: No discharge.        Left eye: No discharge.     Conjunctiva/sclera: Conjunctivae normal.     Pupils: Pupils are equal, round, and reactive to light.  Neck:     Thyroid: No thyromegaly.     Vascular: No JVD.  Cardiovascular:     Rate and Rhythm: Normal rate and regular rhythm.     Heart sounds: Normal heart sounds. No murmur heard.    No friction rub. No gallop.  Pulmonary:     Effort: Pulmonary effort is normal.     Breath sounds: Normal breath sounds. No wheezing, rhonchi or rales.  Abdominal:     General: Bowel sounds are normal.     Palpations: Abdomen is soft. There is no mass.     Tenderness: There is abdominal tenderness. There is no right CVA tenderness, left CVA tenderness or guarding.  Musculoskeletal:        General: Normal range of motion.     Cervical back: Normal range of motion and neck supple.  Lymphadenopathy:     Cervical: No cervical adenopathy.  Skin:    General: Skin is warm and dry.  Neurological:     Mental Status: She is alert.     Deep Tendon Reflexes: Reflexes are normal and symmetric.     Wt Readings from Last 3 Encounters:  01/10/24 149 lb (67.6 kg)  12/13/23 149 lb (67.6 kg)  10/05/23 151 lb (68.5 kg)    BP 118/76   Pulse 92   Resp 16   Ht 4\' 11"  (1.499 m)   Wt 149 lb (67.6 kg)   SpO2 98%   BMI 30.09 kg/m   Assessment and Plan:  1. Dysuria (Primary) Onset over the last 5 days of dysuria along with frequency and urgency.  Symptomatology has escalated in terms  of intensity requiring patient to use over-the-counter analgesic to the bladder.  Urinalysis was done which noted red and white cells and abundance. - POCT URINE DIPSTICK  2. Cystitis This is consistent with an acute cystitis and we will initiate cephalexin 500 mg 3 times a day for 7 days.  Culture was sent and we will verify sensitivities and call patient with results. - Urine Culture    Alayne Allis, MD

## 2024-01-15 LAB — URINE CULTURE

## 2024-01-16 ENCOUNTER — Encounter: Payer: Self-pay | Admitting: Family Medicine

## 2024-01-17 ENCOUNTER — Other Ambulatory Visit: Payer: Self-pay

## 2024-01-17 MED ORDER — NITROFURANTOIN MONOHYD MACRO 100 MG PO CAPS: 100.0000 mg | ORAL_CAPSULE | Freq: Two times a day (BID) | ORAL | 0 refills | Status: DC

## 2024-01-21 ENCOUNTER — Encounter: Payer: Self-pay | Admitting: Family Medicine

## 2024-01-21 ENCOUNTER — Ambulatory Visit (INDEPENDENT_AMBULATORY_CARE_PROVIDER_SITE_OTHER): Admitting: Family Medicine

## 2024-01-21 VITALS — BP 120/74 | HR 71 | Ht 59.0 in | Wt 147.0 lb

## 2024-01-21 DIAGNOSIS — R300 Dysuria: Secondary | ICD-10-CM

## 2024-01-21 DIAGNOSIS — R2230 Localized swelling, mass and lump, unspecified upper limb: Secondary | ICD-10-CM | POA: Diagnosis not present

## 2024-01-21 LAB — POCT URINALYSIS DIPSTICK
Bilirubin, UA: NEGATIVE
Blood, UA: NEGATIVE
Glucose, UA: NEGATIVE
Ketones, UA: NEGATIVE
Leukocytes, UA: NEGATIVE
Nitrite, UA: NEGATIVE
Protein, UA: NEGATIVE
Spec Grav, UA: 1.01 (ref 1.010–1.025)
Urobilinogen, UA: 0.2 U/dL
pH, UA: 6 (ref 5.0–8.0)

## 2024-01-21 MED ORDER — FLUCONAZOLE 150 MG PO TABS: 150.0000 mg | ORAL_TABLET | Freq: Once | ORAL | 0 refills | Status: AC

## 2024-01-21 NOTE — Progress Notes (Signed)
 Date:  01/21/2024   Name:  Jennifer Kirby   DOB:  [birth year]   MRN:  [id number]   Chief Complaint: Mass (X1-2 weeks, right side, not painful, not getting bigger,Upper arm near breast )  Dysuria  This is a recurrent problem. The current episode started 1 to 4 weeks ago. The problem occurs every urination. The problem has been unchanged. Quality: sensation. The pain is mild. There has been no fever. Associated symptoms include frequency and urgency. Pertinent negatives include no discharge, flank pain, hematuria, hesitancy or vomiting. She has tried antibiotics (x2 cephalexin /nitrofurantoin ) for the symptoms. The treatment provided mild relief. There is no history of catheterization, kidney stones, recurrent UTIs, a single kidney, urinary stasis or a urological procedure.    Lab Results  Component Value Date   NA 141 11/05/2022   K 4.4 11/05/2022   CO2 23 11/05/2022   GLUCOSE 95 11/05/2022   BUN 11 11/05/2022   CREATININE 0.57 11/05/2022   CALCIUM  9.3 11/05/2022   EGFR 99 11/05/2022   GFRNONAA >60 08/25/2022   Lab Results  Component Value Date   CHOL 222 (H) 05/04/2023   HDL 79 05/04/2023   LDLCALC 130 (H) 05/04/2023   TRIG 77 05/04/2023   CHOLHDL 3.5 07/28/2017   No results found for: "TSH" No results found for: "HGBA1C" Lab Results  Component Value Date   WBC 6.4 08/25/2022   HGB 13.8 08/25/2022   HCT 40.3 08/25/2022   MCV 86.1 08/25/2022   PLT 283 08/25/2022   Lab Results  Component Value Date   ALT 13 05/04/2023   AST 19 05/04/2023   ALKPHOS 68 05/04/2023   BILITOT 0.3 05/04/2023   No results found for: "25OHVITD2", "25OHVITD3", "VD25OH"   Review of Systems  Respiratory:  Negative for cough, chest tightness and shortness of breath.   Cardiovascular:  Negative for chest pain and palpitations.  Gastrointestinal:  Negative for blood in stool and vomiting.  Endocrine: Negative for polydipsia and polyuria.  Genitourinary:  Positive for dysuria, frequency  and urgency. Negative for flank pain, hematuria and hesitancy.    Patient Active Problem List   Diagnosis Date Noted   Pharyngoesophageal dysphagia 10/28/2023   Dupuytren's disease of palm 01/21/2023   Seasonal allergic rhinitis due to pollen 07/25/2021   Gastroesophageal reflux disease 07/25/2021   Traumatic partial tear of left biceps tendon 05/26/2019   Traumatic complete tear of left rotator cuff 05/26/2019   Synovitis of left shoulder 05/26/2019   Subacromial impingement, left 05/26/2019   Obesity (BMI 30-39.9) 11/25/2018   Contact dermatitis 02/15/2018   Mass of skin of right shoulder 07/27/2016   Brachial neuritis 03/08/2015   Encounter for general adult medical examination without abnormal findings 03/08/2015   Herpes zoster 03/08/2015   H/O hypercholesterolemia 03/08/2015   HLD (hyperlipidemia) 03/08/2015    Allergies  Allergen Reactions   Statins     Makes me hurt    Zetia  [Ezetimibe ] Other (See Comments)    Gave leg cramps    Past Surgical History:  Procedure Laterality Date   COLONOSCOPY  11/12/2017   FOOT FRACTURE SURGERY Left    PLANTAR FASCIECTOMY Left 2008   ROTATOR CUFF REPAIR Left 05/26/2019   bicep tear, tendon repair and skin graft - [facility]    SHOULDER ARTHROSCOPY Right 08/27/2022   Procedure: Right shoulder arthroscopic versus mini open subscapularis and supraspinatus repair, subacromial decompression, distal clavicle excision, and biceps tenodesis;  Surgeon: Lorri Rota, MD;  Location: [facility] OR[facility];  Service: Orthopedics;  Laterality: Right;    Social History   Tobacco Use   Smoking status: Never   Smokeless tobacco: Never  Vaping Use   Vaping status: Never Used  Substance Use Topics   Alcohol use: Not Currently   Drug use: No     Medication list has been reviewed and updated.  Current Meds  Medication Sig   calcium  carbonate (TUMS - DOSED IN MG ELEMENTAL CALCIUM ) 500 MG chewable tablet Chew 1 tablet by mouth daily as needed.    ketoconazole  (NIZORAL ) 2 % cream Apply daily to face as directed   metroNIDAZOLE  (METROCREAM ) 0.75 % cream Apply topically 2 (two) times daily. Apply on face for rosacea every day bid   montelukast  (SINGULAIR ) 10 MG tablet Take 1 tablet (10 mg total) by mouth daily as needed.   nitrofurantoin , macrocrystal-monohydrate, (MACROBID ) 100 MG capsule Take 1 capsule (100 mg total) by mouth 2 (two) times daily.   pantoprazole  (PROTONIX ) 40 MG tablet TAKE 1 TABLET BY MOUTH EVERY DAY   rosuvastatin  (CRESTOR ) 5 MG tablet TAKE 1 TABLET (5 MG TOTAL) BY MOUTH 2 (TWO) TIMES A WEEK ON TUESDAY AND THURSDAY   Sennosides-Docusate Sodium  (SENNA PLUS) 8.6-50 MG CAPS Take 1 capsule by mouth daily.   tacrolimus  (PROTOPIC ) 0.1 % ointment Apply topically 2 (two) times daily. Apply to face BID   [DISCONTINUED] cephALEXin  (KEFLEX ) 500 MG capsule Take 1 capsule (500 mg total) by mouth 3 (three) times daily.   [DISCONTINUED] Cranberry-Vitamin C-Probiotic (AZO CRANBERRY PO) Take by mouth.   [DISCONTINUED] promethazine -dextromethorphan (PROMETHAZINE -DM) 6.25-15 MG/5ML syrup Take 5 mLs by mouth 4 (four) times daily as needed for cough.       01/21/2024    9:57 AM 12/13/2023    9:44 AM 10/05/2023    4:42 PM 05/03/2023   11:08 AM  GAD 7 : Generalized Anxiety Score  Nervous, Anxious, on Edge 0 0 0 0  Control/stop worrying 0 0 0 0  Worry too much - different things 0 0 0 0  Trouble relaxing 0 0 0 0  Restless 0 0 0 0  Easily annoyed or irritable 0 0 0 0  Afraid - awful might happen 0 0 0 0  Total GAD 7 Score 0 0 0 0  Anxiety Difficulty Not difficult at all Not difficult at all Not difficult at all Not difficult at all       01/21/2024    9:57 AM 12/13/2023    9:44 AM 10/05/2023    4:41 PM  Depression screen PHQ 2/9  Decreased Interest 0 0 0  Down, Depressed, Hopeless 0 0 0  PHQ - 2 Score 0 0 0  Altered sleeping  0 3  Tired, decreased energy  0 1  Change in appetite  0 2  Feeling bad or failure about yourself   0 0   Trouble concentrating  0 0  Moving slowly or fidgety/restless  0 0  Suicidal thoughts  0 0  PHQ-9 Score  0 6  Difficult doing work/chores  Not difficult at all Somewhat difficult    BP Readings from Last 3 Encounters:  01/21/24 120/74  01/10/24 118/76  12/13/23 122/78    Physical Exam Vitals reviewed.  Constitutional:      General: She is not in acute distress.    Appearance: She is not diaphoretic.  HENT:     Head: Normocephalic and atraumatic.     Right Ear: Tympanic membrane and external ear normal.     Left Ear: Tympanic membrane and  external ear normal.     Nose: Nose normal.  Eyes:     General:        Right eye: No discharge.        Left eye: No discharge.     Conjunctiva/sclera: Conjunctivae normal.     Pupils: Pupils are equal, round, and reactive to light.  Neck:     Thyroid: No thyromegaly.     Vascular: No carotid bruit or JVD.  Cardiovascular:     Rate and Rhythm: Normal rate and regular rhythm.     Heart sounds: Normal heart sounds. No murmur heard.    No friction rub. No gallop.  Pulmonary:     Effort: Pulmonary effort is normal. No respiratory distress.     Breath sounds: Normal breath sounds. No stridor. No wheezing, rhonchi or rales.  Chest:     Chest wall: No tenderness.  Abdominal:     General: Bowel sounds are normal.     Palpations: Abdomen is soft. There is no hepatomegaly, splenomegaly or mass.     Tenderness: There is no abdominal tenderness. There is no right CVA tenderness, left CVA tenderness or guarding.  Musculoskeletal:        General: Normal range of motion.     Cervical back: Normal range of motion and neck supple. No rigidity or tenderness.  Lymphadenopathy:     Cervical: No cervical adenopathy.     Right cervical: No superficial, deep or posterior cervical adenopathy.    Left cervical: No superficial, deep or posterior cervical adenopathy.     Upper Body:     Right upper body: No supraclavicular or axillary adenopathy.     Left  upper body: No supraclavicular or axillary adenopathy.  Skin:    General: Skin is warm and dry.  Neurological:     Mental Status: She is alert.     Deep Tendon Reflexes: Reflexes are normal and symmetric.     Wt Readings from Last 3 Encounters:  01/21/24 147 lb (66.7 kg)  01/10/24 149 lb (67.6 kg)  12/13/23 149 lb (67.6 kg)    BP 120/74   Pulse 71   Ht 4\' 11"  (1.499 m)   Wt 147 lb (66.7 kg)   SpO2 97%   BMI 29.69 kg/m   Assessment and Plan: 1. Axillary fullness (Primary) Patient notes that there is an asymmetrical fullness when looking anterior to her right axillary area versus her left.  There is no true swelling and there is no palpable mass and there is no tenderness in the areas of note and there is swelling bilateral but in a distribution distribution not necessarily more so on the right versus the left.  I do not think that this is representative of any pathologic concern and probably represents normal asymmetry perhaps accentuated by recent shoulder surgery bilateral in scar tissue deposition.  2. Dysuria Patient has been noted to have continued dysuria we rechecked urine dipstick which noted to have no leukocytes nitrates however there may still be a urine infection/urethral-itis and we will proceed with urine culture.  In the meantime given that she has been on now 2 antibiotics completed course of cephalexin  of which the organism with sensitivity to everything did not resolve the symptomatology so patient was placed on Macrobid  and is partially treated at this time.  Given the possibility of selecting out a candidiasis we will initiate Diflucan  150 once dose and patient is to call if continues to symptomatic on Monday with next step evaluation  and referral to urology. - POCT Urinalysis Dipstick - Urine Culture - fluconazole  (DIFLUCAN ) 150 MG tablet; Take 1 tablet (150 mg total) by mouth once for 1 dose.  Dispense: 1 tablet; Refill: 0     Alayne Allis, MD

## 2024-01-24 ENCOUNTER — Other Ambulatory Visit: Payer: Self-pay

## 2024-01-24 DIAGNOSIS — N3090 Cystitis, unspecified without hematuria: Secondary | ICD-10-CM

## 2024-01-24 DIAGNOSIS — R300 Dysuria: Secondary | ICD-10-CM

## 2024-01-31 ENCOUNTER — Other Ambulatory Visit: Payer: Self-pay

## 2024-01-31 DIAGNOSIS — R300 Dysuria: Secondary | ICD-10-CM

## 2024-02-01 ENCOUNTER — Other Ambulatory Visit
Admission: RE | Admit: 2024-02-01 | Discharge: 2024-02-01 | Disposition: A | Discharge status: Home / Self Care | Attending: Urology | Admitting: Urology

## 2024-02-01 ENCOUNTER — Ambulatory Visit: Admitting: Urology

## 2024-02-01 ENCOUNTER — Encounter: Payer: Self-pay | Admitting: Urology

## 2024-02-01 VITALS — BP 136/77 | HR 74 | Ht 59.0 in | Wt 147.0 lb

## 2024-02-01 DIAGNOSIS — R300 Dysuria: Secondary | ICD-10-CM | POA: Diagnosis not present

## 2024-02-01 LAB — URINALYSIS, COMPLETE (UACMP) WITH MICROSCOPIC
Bilirubin Urine: NEGATIVE
Glucose, UA: NEGATIVE mg/dL
Hgb urine dipstick: NEGATIVE
Ketones, ur: NEGATIVE mg/dL
Leukocytes,Ua: NEGATIVE
Nitrite: NEGATIVE
Protein, ur: NEGATIVE mg/dL
RBC / HPF: NONE SEEN RBC/hpf (ref 0–5)
Specific Gravity, Urine: 1.02 (ref 1.005–1.030)
pH: 5.5 (ref 5.0–8.0)

## 2024-02-01 MED ORDER — CELECOXIB 200 MG PO CAPS
200.0000 mg | ORAL_CAPSULE | Freq: Two times a day (BID) | ORAL | 0 refills | Status: DC
Start: 2024-02-01 — End: 2024-02-28

## 2024-02-01 NOTE — Progress Notes (Signed)
   02/01/24 11:55 AM   Jennifer Kirby 06/18/1956 [id number]  CC: Dysuria, recent UTI  HPI: 68 year old female referred for some mild persistent dysuria after recent treatment of a culture documented E. coli UTI on 01/10/2024.  She was originally treated with nitrofurantoin  without significant improvement and changed to Keflex .  She has some baseline frequency that has been life long.  Her symptoms have improved significantly but she still has some discomfort with urination was referred to urology.  She denies any gross hematuria.  Urinalysis today is benign.  She drinks primarily water during the day.  This was her first UTI in about 10 years.  PMH: Past Medical History:  Diagnosis Date   Actinic keratosis    Allergy    Arthritis    Complication of anesthesia    hard to wake up   GERD (gastroesophageal reflux disease)    Hemorrhoids    History of hiatal hernia    Hyperlipidemia    Shingles     Surgical History: Past Surgical History:  Procedure Laterality Date   COLONOSCOPY  11/12/2017   FOOT FRACTURE SURGERY Left    PLANTAR FASCIECTOMY Left 2008   ROTATOR CUFF REPAIR Left 05/26/2019   bicep tear, tendon repair and skin graft - [facility]    SHOULDER ARTHROSCOPY Right 08/27/2022   Procedure: Right shoulder arthroscopic versus mini open subscapularis and supraspinatus repair, subacromial decompression, distal clavicle excision, and biceps tenodesis;  Surgeon: Lorri Rota, MD;  Location: [facility];  Service: Orthopedics;  Laterality: Right;    Family History: Family History  Problem Relation Age of Onset   Throat cancer Father    Breast cancer Neg Hx     Social History:  reports that she has never smoked. She has never used smokeless tobacco. She reports that she does not currently use alcohol. She reports that she does not use drugs.  Physical Exam: BP 136/77   Pulse 74   Ht 4\' 11"  (1.499 m)   Wt 147 lb (66.7 kg)   BMI 29.69 kg/m    Constitutional:  Alert and  oriented, No acute distress. Cardiovascular: No clubbing, cyanosis, or edema. Respiratory: Normal respiratory effort, no increased work of breathing. GI: Abdomen is soft, nontender, nondistended, no abdominal masses   Laboratory Data: See HPI   Assessment & Plan:   68 year old female with mild dysuria after treatment of E. coli UTI with culture appropriate antibiotics.  We discussed possible etiologies including residual inflammation, persistent infection, IC, genitourinary syndrome menopause, less likely malignancy.  We discussed options including trial of anti-inflammatories, repeat course of antibiotics, topical estrogen cream, or cystoscopy.  Using shared decision making she opted for a course of anti-inflammatories with close follow-up.  Celebrex 200 mg twice daily x 5 days RTC 2 to 3 weeks symptom check, if persistent dysuria would recommend cystoscopy, and if cystoscopy benign trial of topical estrogen cream  Jay Meth, MD 02/01/2024  [facility] [address], [address] [place], [place] [address] [phone]

## 2024-02-08 ENCOUNTER — Encounter: Payer: Self-pay | Admitting: Dermatology

## 2024-02-09 ENCOUNTER — Other Ambulatory Visit: Payer: Self-pay

## 2024-02-09 MED ORDER — OPZELURA 1.5 % EX CREA: TOPICAL_CREAM | CUTANEOUS | 0 refills | Status: DC

## 2024-02-09 MED ORDER — HYDROCORTISONE 2.5 % EX CREA: TOPICAL_CREAM | Freq: Two times a day (BID) | CUTANEOUS | 0 refills | Status: DC | PRN

## 2024-02-14 ENCOUNTER — Ambulatory Visit

## 2024-02-14 DIAGNOSIS — R21 Rash and other nonspecific skin eruption: Secondary | ICD-10-CM

## 2024-02-14 NOTE — Progress Notes (Signed)
 Patch testing was performed on 02/14/2024 using standard technique. True test x 36 applied to back at visit today. Pt advised to keep panels dry until removal in 2 days for first read.

## 2024-02-16 ENCOUNTER — Ambulatory Visit (INDEPENDENT_AMBULATORY_CARE_PROVIDER_SITE_OTHER)

## 2024-02-16 NOTE — Progress Notes (Signed)
 Patient here for day 2 of patch test reading. Patient showed reaction to testing site #20. She was swollen and red around the dye site, photo taken. t Patient advised do not soak nor scrub testing area.  Patient advised how to read testing site until follow up next week.   Lisbeth Rides, RMA

## 2024-02-22 ENCOUNTER — Encounter: Payer: Self-pay | Admitting: Dermatology

## 2024-02-22 ENCOUNTER — Ambulatory Visit: Admitting: Dermatology

## 2024-02-22 DIAGNOSIS — L239 Allergic contact dermatitis, unspecified cause: Secondary | ICD-10-CM

## 2024-02-22 DIAGNOSIS — L308 Other specified dermatitis: Secondary | ICD-10-CM

## 2024-02-22 DIAGNOSIS — L219 Seborrheic dermatitis, unspecified: Secondary | ICD-10-CM

## 2024-02-22 DIAGNOSIS — L234 Allergic contact dermatitis due to dyes: Secondary | ICD-10-CM

## 2024-02-22 MED ORDER — OPZELURA 1.5 % EX CREA: TOPICAL_CREAM | CUTANEOUS | 2 refills | Status: AC

## 2024-02-22 MED ORDER — CLOBETASOL PROPIONATE 0.05 % EX SHAM: MEDICATED_SHAMPOO | CUTANEOUS | 3 refills | Status: DC

## 2024-02-22 NOTE — Progress Notes (Signed)
   Follow Up Visit   Subjective  Jennifer Kirby is a 68 y.o. female who presents for the following: patch test final reading. Patient states she knows the cause of the rashes PPD, in hair dye.     The following portions of the chart were reviewed this encounter and updated as appropriate: medications, allergies, medical history  Review of Systems:  No other skin or systemic complaints except as noted in HPI or Assessment and Plan.  Objective  Well appearing patient in no apparent distress; mood and affect are within normal limits.  A focused examination was performed of the following areas: Back, face  Relevant exam findings are noted in the Assessment and Plan.    Assessment & Plan     ALLERGIC CONTACT DERMATITIS/eczema to PPD causing facial eczema Exam: pink edematous plaque at mid back, #20, PPD. Erythema with scale at forehead, temples, mid face.   Treatment Plan: Avoid products with PPD. Handout given. Added to allergies Stop Hydrocortisone  cream on face.  Start opzelura  on red scaly areas of face BID until smooth. Sent Rx to [facility] $0 if covered $35 if not covered  PATCH TEST POSITIVE #20.  p-phenylenediamine  #1 ? Nickel. Slight erythema but no scaling or edema. Favour irritant dermatitis but consider nickel allergy if jewelry causes rash   Return for TBSE As Scheduled, With Dr. Annette Barters.  I, Jill Parcell, CMA, am acting as scribe for Harris Liming, MD.   Documentation: I have reviewed the above documentation for accuracy and completeness, and I agree with the above.  Harris Liming, MD

## 2024-02-22 NOTE — Patient Instructions (Addendum)
 Start Opzelura  once daily to face.  Stop Hydrocortisone  cream on face.   Start Clobetasol  shampoo as directed.      Your prescription was sent to [facility] in [place]. A representative from [employer] will contact you within 2 business hours to verify your address and insurance information to schedule a free delivery. If for any reason you do not receive a phone call from them, please reach out to them. Their phone number is [phone] and their hours are Monday-Friday 9:00 am-5:00 pm.      Due to recent changes in healthcare laws, you may see results of your pathology and/or laboratory studies on MyChart before the doctors have had a chance to review them. We understand that in some cases there may be results that are confusing or concerning to you. Please understand that not all results are received at the same time and often the doctors may need to interpret multiple results in order to provide you with the best plan of care or course of treatment. Therefore, we ask that you please give us  2 business days to thoroughly review all your results before contacting the office for clarification. Should we see a critical lab result, you will be contacted sooner.   If You Need Anything After Your Visit  If you have any questions or concerns for your doctor, please call our main line at [phone] and press option 4 to reach your doctor's medical assistant. If no one answers, please leave a voicemail as directed and we will return your call as soon as possible. Messages left after 4 pm will be answered the following business day.   You may also send us  a message via MyChart. We typically respond to MyChart messages within 1-2 business days.  For prescription refills, please ask your pharmacy to contact our office. Our fax number is [phone].  If you have an urgent issue when the clinic is closed that cannot wait until the next business day, you can page your doctor at the  number below.    Please note that while we do our best to be available for urgent issues outside of office hours, we are not available 24/7.   If you have an urgent issue and are unable to reach us , you may choose to seek medical care at your doctor's office, retail clinic, urgent care center, or emergency room.  If you have a medical emergency, please immediately call 911 or go to the emergency department.  Pager Numbers  - Dr. Bary Likes: [phone]  - Dr. Annette Barters: [phone]  - Dr. Felipe Horton: [phone]   In the event of inclement weather, please call our main line at [phone] for an update on the status of any delays or closures.  Dermatology Medication Tips: Please keep the boxes that topical medications come in in order to help keep track of the instructions about where and how to use these. Pharmacies typically print the medication instructions only on the boxes and not directly on the medication tubes.   If your medication is too expensive, please contact our office at [phone] option 4 or send us  a message through MyChart.   We are unable to tell what your co-pay for medications will be in advance as this is different depending on your insurance coverage. However, we may be able to find a substitute medication at lower cost or fill out paperwork to get insurance to cover a needed medication.   If a prior authorization is required to get your medication covered  by your insurance company, please allow us  1-2 business days to complete this process.  Drug prices often vary depending on where the prescription is filled and some pharmacies may offer cheaper prices.  The website www.goodrx.com contains coupons for medications through different pharmacies. The prices here do not account for what the cost may be with help from insurance (it may be cheaper with your insurance), but the website can give you the price if you did not use any insurance.  - You can print the  associated coupon and take it with your prescription to the pharmacy.  - You may also stop by our office during regular business hours and pick up a GoodRx coupon card.  - If you need your prescription sent electronically to a different pharmacy, notify our office through [facility] or by phone at [phone] option 4.     Si Usted Necesita Algo Despus de Su Visita  Tambin puede enviarnos un mensaje a travs de Clinical cytogeneticist. Por lo general respondemos a los mensajes de MyChart en el transcurso de 1 a 2 das hbiles.  Para renovar recetas, por favor pida a su farmacia que se ponga en contacto con nuestra oficina. Franz Jacks de fax es [place] [phone].  Si tiene un asunto urgente cuando la clnica est cerrada y que no puede esperar hasta el siguiente da hbil, puede llamar/localizar a su doctor(a) al nmero que aparece a continuacin.   Por favor, tenga en cuenta que aunque hacemos todo lo posible para estar disponibles para asuntos urgentes fuera del horario de [place], no estamos disponibles las 24 horas del da, los 7 [address]  [address] de la [place].   Si tiene un problema urgente y no puede comunicarse con nosotros, puede optar por buscar atencin mdica  en el consultorio de su doctor(a), en una clnica privada, en un centro de atencin urgente o en una sala de emergencias.  Si tiene Engineer, drilling, por favor llame inmediatamente al 911 o vaya a la sala de emergencias.  Nmeros de bper  - Dr. Bary Likes: [phone]  - Dra. Annette Barters: [phone]  - Dr. Felipe Horton: [phone]   En caso de inclemencias del tiempo, por favor llame a Lajuan Pila principal al [phone] para una actualizacin sobre el [place] de cualquier retraso o cierre.  Consejos para la medicacin en dermatologa: Por favor, guarde las cajas en las que vienen los medicamentos de uso tpico para ayudarle a seguir las instrucciones sobre dnde y cmo usarlos. Las farmacias generalmente imprimen las instrucciones  del medicamento slo en las cajas y no directamente en los tubos del [place].   Si su medicamento es muy caro, por favor, pngase en contacto con Bettyjane Brunet llamando al [phone] y presione la opcin 4 o envenos un mensaje a travs de Clinical cytogeneticist.   No podemos decirle cul ser su copago por los medicamentos por adelantado ya que esto es diferente dependiendo de la cobertura de su seguro. Sin embargo, es posible que podamos encontrar un medicamento sustituto a Audiological scientist un formulario para que el seguro cubra el medicamento que se considera necesario.   Si se requiere una autorizacin previa para que su compaa de seguros [place] su medicamento, por favor permtanos de 1 a 2 das hbiles para completar este proceso.  Los precios de los medicamentos varan con frecuencia dependiendo del Environmental consultant de dnde se surte la receta y alguna farmacias pueden ofrecer precios ms baratos.  El sitio web www.goodrx.com tiene cupones para medicamentos de Health and safety inspector. Los precios aqu  no tienen en cuenta lo que podra costar con la ayuda del seguro (puede ser ms barato con su seguro), pero el sitio web puede darle el precio si no Visual merchandiser.  - Puede imprimir el cupn correspondiente y llevarlo con su receta a la farmacia.  - Tambin puede pasar por nuestra oficina durante el horario de atencin regular y Education officer, museum una tarjeta de cupones de GoodRx.  - Si necesita que su receta se enve electrnicamente a una farmacia diferente, informe a nuestra oficina a travs de MyChart de [place] o por telfono llamando al [phone] y presione la opcin 4.

## 2024-02-28 ENCOUNTER — Encounter: Payer: Self-pay | Admitting: Urology

## 2024-02-28 ENCOUNTER — Ambulatory Visit: Admitting: Urology

## 2024-02-28 VITALS — BP 145/77 | HR 96

## 2024-02-28 DIAGNOSIS — N952 Postmenopausal atrophic vaginitis: Secondary | ICD-10-CM | POA: Diagnosis not present

## 2024-02-28 MED ORDER — ESTRADIOL 0.1 MG/GM VA CREA: TOPICAL_CREAM | VAGINAL | 12 refills | Status: DC

## 2024-02-28 NOTE — Progress Notes (Signed)
 02/28/2024 11:52 AM   Jennifer Kirby [birth year] [id number]  Referring provider: Clarise Crooks, MD [address] [place],  [place] [address]  Urological history: 1. rUTI - January 10, 2024, E. Coli, persistent symptoms  Chief Complaint  Patient presents with   Dysuria   HPI: Jennifer Kirby is a 68 y.o. woman who presents today for follow up.    Previous records reviewed.   She is seeing Dr. Estanislao Heimlich on May 6 for the complaint of mild dysuria after treatment of E. coli UTI with culture appropriate antibiotics.  She was given Celebrex  20 mg twice daily for 5 days and she started cranberry tablets.  She states her symptoms have abated, but she still feels like something is not right in the urethra.  She was going to cancel this appointment, but her daughter-in-law, who is a Engineer, civil (consulting), encouraged her to follow-up.  Patient denies any modifying or aggravating factors.  Patient denies any recent UTI's, gross hematuria, dysuria or suprapubic/flank pain.  Patient denies any fevers, chills, nausea or vomiting.    PMH: Past Medical History:  Diagnosis Date   Actinic keratosis    Allergy    Arthritis    Complication of anesthesia    hard to wake up   GERD (gastroesophageal reflux disease)    Hemorrhoids    History of hiatal hernia    Hyperlipidemia    Shingles     Surgical History: Past Surgical History:  Procedure Laterality Date   COLONOSCOPY  11/12/2017   FOOT FRACTURE SURGERY Left    PLANTAR FASCIECTOMY Left 2008   ROTATOR CUFF REPAIR Left 05/26/2019   bicep tear, tendon repair and skin graft - [facility]    SHOULDER ARTHROSCOPY Right 08/27/2022   Procedure: Right shoulder arthroscopic versus mini open subscapularis and supraspinatus repair, subacromial decompression, distal clavicle excision, and biceps tenodesis;  Surgeon: Lorri Rota, MD;  Location: [facility] OR[facility];  Service: Orthopedics;  Laterality: Right;    Home Medications:  Allergies as of 02/28/2024        Reactions   Paraphenylenediamine Rash   Positive patch test   Statins    Makes me hurt   Zetia  [ezetimibe ] Other (See Comments)   Gave leg cramps        Medication List        Accurate as of February 28, 2024 11:52 AM. If you have any questions, ask your nurse or doctor.          STOP taking these medications    celecoxib  200 MG capsule Commonly known as: CeleBREX    metroNIDAZOLE  0.75 % cream Commonly known as: METROCREAM    montelukast  10 MG tablet Commonly known as: SINGULAIR        TAKE these medications    calcium  carbonate 500 MG chewable tablet Commonly known as: TUMS - dosed in mg elemental calcium  Chew 1 tablet by mouth daily as needed.   Clobetasol  Propionate 0.05 % shampoo Apply to dry scalp and let sit 20 minutes before rinsing. Use daily until symptoms improve then use once or twice a week as needed only.   CRANBERRY PO Take by mouth daily.   estradiol 0.1 MG/GM vaginal cream Commonly known as: ESTRACE VAGINAL Apply 0.5mg  (pea-sized amount)  just inside the vaginal introitus with a finger-tip on Monday, Wednesday and Friday nights.   hydrocortisone  2.5 % cream Apply topically 2 (two) times daily as needed (Rash).   ketoconazole  2 % cream Commonly known as: NIZORAL  Apply daily to face as  directed   Opzelura  1.5 % Crea Generic drug: Ruxolitinib Phosphate  Apply to aa's rash QD PRN.   pantoprazole  40 MG tablet Commonly known as: PROTONIX  TAKE 1 TABLET BY MOUTH EVERY DAY   rosuvastatin  5 MG tablet Commonly known as: CRESTOR  TAKE 1 TABLET (5 MG TOTAL) BY MOUTH 2 (TWO) TIMES A WEEK ON TUESDAY AND THURSDAY   Senna Plus 50-8.6 MG Caps Generic drug: Sennosides-Docusate Sodium  Take 1 capsule by mouth daily.   tacrolimus  0.1 % ointment Commonly known as: PROTOPIC  Apply topically 2 (two) times daily. Apply to face BID        Allergies:  Allergies  Allergen Reactions   Paraphenylenediamine Rash    Positive patch test   Statins      Makes me hurt    Zetia  [Ezetimibe ] Other (See Comments)    Gave leg cramps    Family History: Family History  Problem Relation Age of Onset   Throat cancer Father    Breast cancer Neg Hx     Social History:  reports that she has never smoked. She has never used smokeless tobacco. She reports that she does not currently use alcohol. She reports that she does not use drugs.  ROS: Pertinent ROS in HPI  Physical Exam: BP (!) 145/77 (BP Location: Left Arm, Patient Position: Sitting, Cuff Size: Normal)   Pulse 96   SpO2 98%   Constitutional:  Well nourished. Alert and oriented, No acute distress. HEENT: [place] AT, moist mucus membranes.  Trachea midline Cardiovascular: No clubbing, cyanosis, or edema. Respiratory: Normal respiratory effort, no increased work of breathing. GU: No CVA tenderness.  No bladder fullness or masses.  Recession of labia minora, dry, pale vulvar vaginal mucosa and loss of mucosal ridges and folds.  Normal urethral meatus, no lesions, no prolapse, no discharge.   No urethral masses, tenderness and/or tenderness. No bladder fullness, tenderness or masses. Pale vagina mucosa, fair estrogen effect, no discharge, no lesions, good pelvic support, no cystocele and no rectocele noted.  Anus and perineum are without rashes or lesions.    Neurologic: Grossly intact, no focal deficits, moving all 4 extremities. Psychiatric: Normal mood and affect.    Laboratory Data:    Component Value Date/Time   CHOL 222 (H) 05/04/2023 0806   HDL 79 05/04/2023 0806   CHOLHDL 3.5 07/28/2017 1000   LDLCALC 130 (H) 05/04/2023 0806   Lab Results  Component Value Date   AST 19 05/04/2023   Lab Results  Component Value Date   ALT 13 05/04/2023  I have reviewed the labs.   Pertinent Imaging: N/A  Assessment & Plan:    1. Vaginal atrophy - I explained to the patient that when women go through menopause and her estrogen levels are severely diminished, which leads to thinning, drying  and loss of elasticity was - Patient was given a prescription for vaginal estrogen cream and instructed to apply 0.5mg  (pea-sized amount)  just inside the vaginal introitus with a finger-tip on Monday, Wednesday and Friday nights  - If she still continues to have that uncomfortable feeling in her urethral meatus, she is going to let me know via MyChart and we will schedule cystoscopy or if the uncomfortable feeling in her urethral meatus increases we will schedule cystoscopy sooner -I did explain to her what cystoscopy consists of and what to expect afterwards in preparation if we need to do it in the future   Return in about 1 month (around 03/29/2024) for via MyChart .  These  notes generated with voice recognition software. I apologize for typographical errors.  Briant Camper  [facility] [address]  [address] [place], [place] [address] [phone]

## 2024-02-29 DIAGNOSIS — H43811 Vitreous degeneration, right eye: Secondary | ICD-10-CM | POA: Diagnosis not present

## 2024-02-29 DIAGNOSIS — H1045 Other chronic allergic conjunctivitis: Secondary | ICD-10-CM | POA: Diagnosis not present

## 2024-02-29 DIAGNOSIS — H2513 Age-related nuclear cataract, bilateral: Secondary | ICD-10-CM | POA: Diagnosis not present

## 2024-04-04 DIAGNOSIS — N952 Postmenopausal atrophic vaginitis: Secondary | ICD-10-CM

## 2024-04-05 MED ORDER — ESTRADIOL 0.1 MG/GM VA CREA: TOPICAL_CREAM | VAGINAL | 12 refills | Status: AC

## 2024-04-05 NOTE — Telephone Encounter (Signed)
 RX sent

## 2024-05-09 ENCOUNTER — Encounter: Payer: Self-pay | Admitting: Dermatology

## 2024-05-09 ENCOUNTER — Ambulatory Visit: Admitting: Dermatology

## 2024-05-09 DIAGNOSIS — L230 Allergic contact dermatitis due to metals: Secondary | ICD-10-CM

## 2024-05-09 DIAGNOSIS — L918 Other hypertrophic disorders of the skin: Secondary | ICD-10-CM | POA: Diagnosis not present

## 2024-05-09 DIAGNOSIS — L239 Allergic contact dermatitis, unspecified cause: Secondary | ICD-10-CM

## 2024-05-09 DIAGNOSIS — D224 Melanocytic nevi of scalp and neck: Secondary | ICD-10-CM | POA: Diagnosis not present

## 2024-05-09 DIAGNOSIS — D229 Melanocytic nevi, unspecified: Secondary | ICD-10-CM

## 2024-05-09 MED ORDER — TRIAMCINOLONE ACETONIDE 0.1 % EX CREA: TOPICAL_CREAM | CUTANEOUS | 0 refills | Status: AC

## 2024-05-09 NOTE — Patient Instructions (Signed)

## 2024-05-09 NOTE — Progress Notes (Signed)
   Follow Up Visit   Subjective  Jennifer Kirby is a 68 y.o. female who presents for the following: Rash on the ant neck and chest - started about 4 weeks ago, very itchy, patient scratches at it. She stopped all topical treatment on the face because they was making condition worse (HC 2.5%, opzelura, ketoconazole, tacrolimus 0.1%). Pt did try HC cream for the rash on the neck, but it made the itching worse so she stopped using it. Pt wears a necklace every day, but it is 14k gold, and she's had it many years with no issues. Pt recently has black mold issues from moisture under the house, but it is a small amount, and is being treated tomorrow.  The following portions of the chart were reviewed this encounter and updated as appropriate: medications, allergies, medical history  Review of Systems:  No other skin or systemic complaints except as noted in HPI or Assessment and Plan.  Objective  Well appearing patient in no apparent distress; mood and affect are within normal limits.  A focused examination was performed of the following areas: The face and neck  Relevant exam findings are noted in the Assessment and Plan.    Assessment & Plan   ALLERGIC CONTACT DERMATITIS DUE TO METALS   ACROCHORDON   BENIGN NEVUS    Geometric Eczematous dermatitis suggestive of allergic contact dermatitis to metal in necklace  PATCH TEST POSITIVE 02/22/24 #20.  p-phenylenediamine  #1 ? Nickel. Slight erythema but no scaling or edema. Favour irritant dermatitis but consider nickel allergy if jewelry causes rash       Exam: erythematous scaly slightly lichenified plaque on anterior neck  Treatment Plan: Discussed with pt that 14k gold is mixed with other metals which may include nickel (suspected allergen) and she may be reacting to the necklace. Recommend determining contents of metal alloy and/or wear hypoallergenic metals only  Start TMC 0.1% cream to aa's BID until clear. Topical  steroids (such as triamcinolone, fluocinolone, fluocinonide, mometasone, clobetasol, halobetasol, betamethasone, hydrocortisone) can cause thinning and lightening of the skin if they are used for too long in the same area. Your physician has selected the right strength medicine for your problem and area affected on the body. Please use your medication only as directed by your physician to prevent side effects.   Do not recommend Prednisone at this time due to possible side effects from systemic treatment.  Acrochordons (Skin Tags) - Fleshy, skin-colored pedunculated papules - Benign appearing.  - Observe. - If desired, they can be removed with an in office procedure that is not covered by insurance. - Please call the clinic if you notice any new or changing lesions.  MELANOCYTIC NEVI Exam: Tan-brown and/or pink-flesh-colored symmetric macules and papules L neck base  Treatment Plan: Benign appearing on exam today. Recommend observation. Call clinic for new or changing moles. Recommend daily use of broad spectrum spf 30+ sunscreen to sun-exposed areas.   Return for appointment as scheduled.  LILLETTE Rosina Mayans, CMA, am acting as scribe for Boneta Sharps, MD .   Documentation: I have reviewed the above documentation for accuracy and completeness, and I agree with the above.  Boneta Sharps, MD

## 2024-05-11 DIAGNOSIS — K080 Exfoliation of teeth due to systemic causes: Secondary | ICD-10-CM | POA: Diagnosis not present

## 2024-06-08 DIAGNOSIS — K080 Exfoliation of teeth due to systemic causes: Secondary | ICD-10-CM | POA: Diagnosis not present

## 2024-07-05 ENCOUNTER — Other Ambulatory Visit: Payer: Self-pay | Admitting: Internal Medicine

## 2024-07-05 DIAGNOSIS — M1711 Unilateral primary osteoarthritis, right knee: Secondary | ICD-10-CM | POA: Diagnosis not present

## 2024-07-05 DIAGNOSIS — Z1231 Encounter for screening mammogram for malignant neoplasm of breast: Secondary | ICD-10-CM

## 2024-07-05 DIAGNOSIS — E785 Hyperlipidemia, unspecified: Secondary | ICD-10-CM | POA: Diagnosis not present

## 2024-07-05 DIAGNOSIS — M1712 Unilateral primary osteoarthritis, left knee: Secondary | ICD-10-CM | POA: Diagnosis not present

## 2024-07-05 DIAGNOSIS — Z1331 Encounter for screening for depression: Secondary | ICD-10-CM | POA: Diagnosis not present

## 2024-07-05 DIAGNOSIS — Z713 Dietary counseling and surveillance: Secondary | ICD-10-CM | POA: Diagnosis not present

## 2024-08-10 ENCOUNTER — Ambulatory Visit
Admission: RE | Admit: 2024-08-10 | Discharge: 2024-08-10 | Disposition: A | Discharge status: Home / Self Care | Source: Ambulatory Visit | Attending: Internal Medicine | Admitting: Internal Medicine

## 2024-08-10 DIAGNOSIS — Z1231 Encounter for screening mammogram for malignant neoplasm of breast: Secondary | ICD-10-CM | POA: Insufficient documentation

## 2024-11-28 ENCOUNTER — Ambulatory Visit: Admitting: Dermatology

## 2024-12-04 ENCOUNTER — Ambulatory Visit: Admitting: Dermatology
